# Patient Record
Sex: Female | Born: 1945 | Race: White | Hispanic: No | Marital: Married | Smoking: Former smoker
Health system: Southern US, Community
[De-identification: ages and names within clinical notes are randomized; demographics above are authoritative.]

## PROBLEM LIST (undated history)

## (undated) DIAGNOSIS — F32A Depression, unspecified: Secondary | ICD-10-CM

## (undated) DIAGNOSIS — M810 Age-related osteoporosis without current pathological fracture: Secondary | ICD-10-CM

## (undated) DIAGNOSIS — G5601 Carpal tunnel syndrome, right upper limb: Secondary | ICD-10-CM

## (undated) DIAGNOSIS — R51 Headache: Secondary | ICD-10-CM

## (undated) DIAGNOSIS — Z87442 Personal history of urinary calculi: Secondary | ICD-10-CM

## (undated) DIAGNOSIS — J189 Pneumonia, unspecified organism: Secondary | ICD-10-CM

## (undated) DIAGNOSIS — S52501A Unspecified fracture of the lower end of right radius, initial encounter for closed fracture: Secondary | ICD-10-CM

## (undated) DIAGNOSIS — K589 Irritable bowel syndrome without diarrhea: Secondary | ICD-10-CM

## (undated) DIAGNOSIS — D649 Anemia, unspecified: Secondary | ICD-10-CM

## (undated) DIAGNOSIS — C801 Malignant (primary) neoplasm, unspecified: Secondary | ICD-10-CM

## (undated) DIAGNOSIS — F329 Major depressive disorder, single episode, unspecified: Secondary | ICD-10-CM

## (undated) DIAGNOSIS — I2699 Other pulmonary embolism without acute cor pulmonale: Secondary | ICD-10-CM

## (undated) DIAGNOSIS — M1990 Unspecified osteoarthritis, unspecified site: Secondary | ICD-10-CM

## (undated) DIAGNOSIS — R519 Headache, unspecified: Secondary | ICD-10-CM

## (undated) DIAGNOSIS — K760 Fatty (change of) liver, not elsewhere classified: Secondary | ICD-10-CM

## (undated) DIAGNOSIS — K635 Polyp of colon: Secondary | ICD-10-CM

## (undated) HISTORY — PX: ABDOMINAL HYSTERECTOMY: SHX81

## (undated) HISTORY — PX: TONSILLECTOMY: SUR1361

## (undated) HISTORY — PX: BREAST SURGERY: SHX581

## (undated) HISTORY — PX: COLONOSCOPY: SHX174

## (undated) HISTORY — PX: FRACTURE SURGERY: SHX138

---

## 1998-09-28 ENCOUNTER — Ambulatory Visit (HOSPITAL_COMMUNITY): Admission: RE | Admit: 1998-09-28 | Discharge: 1998-09-28 | Payer: Self-pay | Admitting: Orthopedic Surgery

## 1998-09-29 ENCOUNTER — Ambulatory Visit (HOSPITAL_COMMUNITY): Admission: RE | Admit: 1998-09-29 | Discharge: 1998-09-29 | Payer: Self-pay | Admitting: Orthopedic Surgery

## 1998-09-29 ENCOUNTER — Encounter: Payer: Self-pay | Admitting: Orthopedic Surgery

## 1999-10-17 ENCOUNTER — Other Ambulatory Visit: Admission: RE | Admit: 1999-10-17 | Discharge: 1999-10-17 | Payer: Self-pay | Admitting: *Deleted

## 2000-12-09 ENCOUNTER — Encounter: Payer: Self-pay | Admitting: Family Medicine

## 2000-12-09 ENCOUNTER — Encounter: Admission: RE | Admit: 2000-12-09 | Discharge: 2000-12-09 | Payer: Self-pay | Admitting: Family Medicine

## 2001-08-12 ENCOUNTER — Encounter: Payer: Self-pay | Admitting: Orthopedic Surgery

## 2001-08-12 ENCOUNTER — Ambulatory Visit (HOSPITAL_BASED_OUTPATIENT_CLINIC_OR_DEPARTMENT_OTHER): Admission: RE | Admit: 2001-08-12 | Discharge: 2001-08-12 | Payer: Self-pay | Admitting: Orthopedic Surgery

## 2001-12-16 ENCOUNTER — Ambulatory Visit (HOSPITAL_BASED_OUTPATIENT_CLINIC_OR_DEPARTMENT_OTHER): Admission: RE | Admit: 2001-12-16 | Discharge: 2001-12-16 | Payer: Self-pay | Admitting: Orthopedic Surgery

## 2001-12-16 ENCOUNTER — Encounter: Payer: Self-pay | Admitting: Orthopedic Surgery

## 2003-01-12 ENCOUNTER — Ambulatory Visit (HOSPITAL_BASED_OUTPATIENT_CLINIC_OR_DEPARTMENT_OTHER): Admission: RE | Admit: 2003-01-12 | Discharge: 2003-01-12 | Payer: Self-pay | Admitting: Orthopedic Surgery

## 2013-08-12 DIAGNOSIS — M25559 Pain in unspecified hip: Secondary | ICD-10-CM | POA: Insufficient documentation

## 2015-07-20 ENCOUNTER — Encounter (HOSPITAL_COMMUNITY): Payer: Self-pay | Admitting: *Deleted

## 2015-07-20 NOTE — Progress Notes (Signed)
Pt has been on Phentermine, last dose was yesterday 07/19/15. Dr. Glennon Mac, anesthesiologist made aware. She states to make sure pt doesn't take any more. Pt was instructed not to take any until after surgery.

## 2015-07-21 ENCOUNTER — Encounter (HOSPITAL_BASED_OUTPATIENT_CLINIC_OR_DEPARTMENT_OTHER)
Admission: RE | Disposition: A | Discharge status: Home / Self Care | Payer: Self-pay | Source: Ambulatory Visit | Attending: Orthopedic Surgery

## 2015-07-21 ENCOUNTER — Ambulatory Visit (HOSPITAL_BASED_OUTPATIENT_CLINIC_OR_DEPARTMENT_OTHER): Payer: Medicare Other | Admitting: Anesthesiology

## 2015-07-21 ENCOUNTER — Ambulatory Visit (HOSPITAL_COMMUNITY)
Admission: RE | Admit: 2015-07-21 | Discharge: 2015-07-21 | Disposition: A | Discharge status: Home / Self Care | Payer: Medicare Other | Source: Ambulatory Visit | Attending: Orthopedic Surgery | Admitting: Orthopedic Surgery

## 2015-07-21 ENCOUNTER — Encounter (HOSPITAL_BASED_OUTPATIENT_CLINIC_OR_DEPARTMENT_OTHER): Payer: Self-pay

## 2015-07-21 DIAGNOSIS — Z79899 Other long term (current) drug therapy: Secondary | ICD-10-CM | POA: Diagnosis not present

## 2015-07-21 DIAGNOSIS — G5601 Carpal tunnel syndrome, right upper limb: Secondary | ICD-10-CM

## 2015-07-21 DIAGNOSIS — M1990 Unspecified osteoarthritis, unspecified site: Secondary | ICD-10-CM | POA: Insufficient documentation

## 2015-07-21 DIAGNOSIS — Z79891 Long term (current) use of opiate analgesic: Secondary | ICD-10-CM | POA: Insufficient documentation

## 2015-07-21 DIAGNOSIS — W19XXXA Unspecified fall, initial encounter: Secondary | ICD-10-CM | POA: Insufficient documentation

## 2015-07-21 DIAGNOSIS — Z87891 Personal history of nicotine dependence: Secondary | ICD-10-CM | POA: Diagnosis not present

## 2015-07-21 DIAGNOSIS — K589 Irritable bowel syndrome without diarrhea: Secondary | ICD-10-CM | POA: Insufficient documentation

## 2015-07-21 DIAGNOSIS — F4321 Adjustment disorder with depressed mood: Secondary | ICD-10-CM | POA: Diagnosis not present

## 2015-07-21 DIAGNOSIS — S52501A Unspecified fracture of the lower end of right radius, initial encounter for closed fracture: Secondary | ICD-10-CM | POA: Diagnosis not present

## 2015-07-21 HISTORY — PX: CARPAL TUNNEL RELEASE: SHX101

## 2015-07-21 HISTORY — DX: Age-related osteoporosis without current pathological fracture: M81.0

## 2015-07-21 HISTORY — DX: Pneumonia, unspecified organism: J18.9

## 2015-07-21 HISTORY — DX: Unspecified fracture of the lower end of right radius, initial encounter for closed fracture: S52.501A

## 2015-07-21 HISTORY — DX: Headache: R51

## 2015-07-21 HISTORY — DX: Carpal tunnel syndrome, right upper limb: G56.01

## 2015-07-21 HISTORY — DX: Irritable bowel syndrome, unspecified: K58.9

## 2015-07-21 HISTORY — DX: Depression, unspecified: F32.A

## 2015-07-21 HISTORY — DX: Fatty (change of) liver, not elsewhere classified: K76.0

## 2015-07-21 HISTORY — DX: Polyp of colon: K63.5

## 2015-07-21 HISTORY — DX: Unspecified osteoarthritis, unspecified site: M19.90

## 2015-07-21 HISTORY — DX: Headache, unspecified: R51.9

## 2015-07-21 HISTORY — DX: Malignant (primary) neoplasm, unspecified: C80.1

## 2015-07-21 HISTORY — PX: OPEN REDUCTION INTERNAL FIXATION (ORIF) DISTAL RADIAL FRACTURE: SHX5989

## 2015-07-21 HISTORY — DX: Major depressive disorder, single episode, unspecified: F32.9

## 2015-07-21 HISTORY — DX: Anemia, unspecified: D64.9

## 2015-07-21 LAB — POCT HEMOGLOBIN-HEMACUE: HEMOGLOBIN: 12.4 g/dL (ref 12.0–15.0)

## 2015-07-21 SURGERY — OPEN REDUCTION INTERNAL FIXATION (ORIF) DISTAL RADIUS FRACTURE
Anesthesia: General | Site: Wrist | Laterality: Right

## 2015-07-21 MED ORDER — EPHEDRINE SULFATE 50 MG/ML IJ SOLN
INTRAMUSCULAR | Status: DC | PRN
  Administered 2015-07-21: 10 mg via INTRAVENOUS
  Administered 2015-07-21: 15 mg via INTRAVENOUS
  Administered 2015-07-21: 10 mg via INTRAVENOUS

## 2015-07-21 MED ORDER — LACTATED RINGERS IV SOLN: INTRAVENOUS | Status: DC

## 2015-07-21 MED ORDER — FENTANYL CITRATE (PF) 100 MCG/2ML IJ SOLN
INTRAMUSCULAR | Status: AC
  Filled 2015-07-21: qty 6

## 2015-07-21 MED ORDER — DEXAMETHASONE SODIUM PHOSPHATE 10 MG/ML IJ SOLN
INTRAMUSCULAR | Status: DC | PRN
  Administered 2015-07-21: 10 mg via INTRAVENOUS

## 2015-07-21 MED ORDER — ONDANSETRON HCL 4 MG/2ML IJ SOLN
INTRAMUSCULAR | Status: DC | PRN
  Administered 2015-07-21: 4 mg via INTRAVENOUS

## 2015-07-21 MED ORDER — 0.9 % SODIUM CHLORIDE (POUR BTL) OPTIME
TOPICAL | Status: DC | PRN
  Administered 2015-07-21: 120 mL

## 2015-07-21 MED ORDER — ONDANSETRON HCL 4 MG PO TABS: 4.0000 mg | ORAL_TABLET | Freq: Three times a day (TID) | ORAL | Status: DC | PRN

## 2015-07-21 MED ORDER — BUPIVACAINE-EPINEPHRINE (PF) 0.5% -1:200000 IJ SOLN
INTRAMUSCULAR | Status: DC | PRN
Start: 2015-07-21 — End: 2015-07-21
  Administered 2015-07-21: 20 mL via PERINEURAL

## 2015-07-21 MED ORDER — FENTANYL CITRATE (PF) 100 MCG/2ML IJ SOLN
INTRAMUSCULAR | Status: AC
  Filled 2015-07-21: qty 2

## 2015-07-21 MED ORDER — PROMETHAZINE HCL 25 MG/ML IJ SOLN: 6.2500 mg | INTRAMUSCULAR | Status: DC | PRN

## 2015-07-21 MED ORDER — MIDAZOLAM HCL 2 MG/2ML IJ SOLN
1.0000 mg | INTRAMUSCULAR | Status: DC | PRN
Start: 2015-07-21 — End: 2015-07-21
  Administered 2015-07-21: 2 mg via INTRAVENOUS

## 2015-07-21 MED ORDER — FENTANYL CITRATE (PF) 100 MCG/2ML IJ SOLN
50.0000 ug | INTRAMUSCULAR | Status: DC | PRN
Start: 2015-07-21 — End: 2015-07-21
  Administered 2015-07-21: 50 ug via INTRAVENOUS
  Administered 2015-07-21: 100 ug via INTRAVENOUS

## 2015-07-21 MED ORDER — MIDAZOLAM HCL 2 MG/2ML IJ SOLN
INTRAMUSCULAR | Status: AC
  Filled 2015-07-21: qty 2

## 2015-07-21 MED ORDER — OXYCODONE-ACETAMINOPHEN 10-325 MG PO TABS: 1.0000 | ORAL_TABLET | Freq: Four times a day (QID) | ORAL | Status: DC | PRN

## 2015-07-21 MED ORDER — SENNA-DOCUSATE SODIUM 8.6-50 MG PO TABS: 2.0000 | ORAL_TABLET | Freq: Every day | ORAL | Status: DC

## 2015-07-21 MED ORDER — CEFAZOLIN SODIUM-DEXTROSE 2-3 GM-% IV SOLR
INTRAVENOUS | Status: AC
  Filled 2015-07-21: qty 50

## 2015-07-21 MED ORDER — CEFAZOLIN SODIUM-DEXTROSE 2-3 GM-% IV SOLR
2.0000 g | INTRAVENOUS | Status: AC
  Administered 2015-07-21: 2 g via INTRAVENOUS

## 2015-07-21 MED ORDER — SCOPOLAMINE 1 MG/3DAYS TD PT72: 1.0000 | MEDICATED_PATCH | Freq: Once | TRANSDERMAL | Status: DC | PRN

## 2015-07-21 MED ORDER — GLYCOPYRROLATE 0.2 MG/ML IJ SOLN: 0.2000 mg | Freq: Once | INTRAMUSCULAR | Status: DC | PRN

## 2015-07-21 MED ORDER — HYDROMORPHONE HCL 1 MG/ML IJ SOLN: 0.2500 mg | INTRAMUSCULAR | Status: DC | PRN

## 2015-07-21 MED ORDER — MEPERIDINE HCL 25 MG/ML IJ SOLN: 6.2500 mg | INTRAMUSCULAR | Status: DC | PRN

## 2015-07-21 MED ORDER — PROPOFOL 10 MG/ML IV BOLUS
INTRAVENOUS | Status: DC | PRN
  Administered 2015-07-21: 150 mg via INTRAVENOUS

## 2015-07-21 MED ORDER — LIDOCAINE HCL (CARDIAC) 20 MG/ML IV SOLN
INTRAVENOUS | Status: DC | PRN
  Administered 2015-07-21: 50 mg via INTRAVENOUS

## 2015-07-21 MED ORDER — LACTATED RINGERS IV SOLN
INTRAVENOUS | Status: DC
  Administered 2015-07-21 (×2): via INTRAVENOUS

## 2015-07-21 MED ORDER — LACTATED RINGERS IV SOLN: 500.0000 mL | INTRAVENOUS | Status: DC

## 2015-07-21 SURGICAL SUPPLY — 72 items
BANDAGE ELASTIC 3 VELCRO ST LF (GAUZE/BANDAGES/DRESSINGS) ×2 IMPLANT
BANDAGE ELASTIC 4 VELCRO ST LF (GAUZE/BANDAGES/DRESSINGS) ×1 IMPLANT
BIT DRILL 2 FAST STEP (BIT) ×1 IMPLANT
BIT DRILL 2.5X4 QC (BIT) ×1 IMPLANT
BLADE MINI RND TIP GREEN BEAV (BLADE) IMPLANT
BLADE SURG 15 STRL LF DISP TIS (BLADE) ×1 IMPLANT
BLADE SURG 15 STRL SS (BLADE) ×4
BNDG CMPR 9X4 STRL LF SNTH (GAUZE/BANDAGES/DRESSINGS) ×1
BNDG COHESIVE 3X5 TAN STRL LF (GAUZE/BANDAGES/DRESSINGS) ×2 IMPLANT
BNDG COHESIVE 4X5 TAN STRL (GAUZE/BANDAGES/DRESSINGS) ×1 IMPLANT
BNDG ESMARK 4X9 LF (GAUZE/BANDAGES/DRESSINGS) ×2 IMPLANT
BRUSH SCRUB EZ PLAIN DRY (MISCELLANEOUS) ×1 IMPLANT
CLSR STERI-STRIP ANTIMIC 1/2X4 (GAUZE/BANDAGES/DRESSINGS) ×2 IMPLANT
CORDS BIPOLAR (ELECTRODE) ×2 IMPLANT
COVER BACK TABLE 60X90IN (DRAPES) ×2 IMPLANT
CUFF TOURNIQUET SINGLE 18IN (TOURNIQUET CUFF) ×1 IMPLANT
DECANTER SPIKE VIAL GLASS SM (MISCELLANEOUS) ×1 IMPLANT
DRAPE EXTREMITY T 121X128X90 (DRAPE) ×2 IMPLANT
DRAPE OEC MINIVIEW 54X84 (DRAPES) ×2 IMPLANT
DRAPE ORTHO SPLIT 77X108 STRL (DRAPES) ×2
DRAPE SURG 17X23 STRL (DRAPES) ×2 IMPLANT
DRAPE SURG ORHT 6 SPLT 77X108 (DRAPES) IMPLANT
DRAPE U 20/CS (DRAPES) ×1 IMPLANT
DURAPREP 26ML APPLICATOR (WOUND CARE) ×2 IMPLANT
GAUZE SPONGE 4X4 12PLY STRL (GAUZE/BANDAGES/DRESSINGS) ×2 IMPLANT
GAUZE XEROFORM 1X8 LF (GAUZE/BANDAGES/DRESSINGS) ×1 IMPLANT
GLOVE BIO SURGEON STRL SZ8 (GLOVE) ×2 IMPLANT
GLOVE BIOGEL M STRL SZ7.5 (GLOVE) ×1 IMPLANT
GLOVE BIOGEL PI IND STRL 8 (GLOVE) ×2 IMPLANT
GLOVE BIOGEL PI INDICATOR 8 (GLOVE) ×3
GLOVE EXAM NITRILE MD LF STRL (GLOVE) ×1 IMPLANT
GLOVE ORTHO TXT STRL SZ7.5 (GLOVE) ×2 IMPLANT
GLOVE SURG ORTHO 8.0 STRL STRW (GLOVE) ×2 IMPLANT
GOWN STRL REUS W/ TWL LRG LVL3 (GOWN DISPOSABLE) ×1 IMPLANT
GOWN STRL REUS W/ TWL XL LVL3 (GOWN DISPOSABLE) ×2 IMPLANT
GOWN STRL REUS W/TWL LRG LVL3 (GOWN DISPOSABLE)
GOWN STRL REUS W/TWL XL LVL3 (GOWN DISPOSABLE) ×6
K-WIRE .062X4 (WIRE) ×2 IMPLANT
NDL HYPO 25X1 1.5 SAFETY (NEEDLE) IMPLANT
NEEDLE HYPO 25X1 1.5 SAFETY (NEEDLE) IMPLANT
NS IRRIG 1000ML POUR BTL (IV SOLUTION) ×2 IMPLANT
PACK BASIN DAY SURGERY FS (CUSTOM PROCEDURE TRAY) ×2 IMPLANT
PAD CAST 3X4 CTTN HI CHSV (CAST SUPPLIES) ×1 IMPLANT
PAD CAST 4YDX4 CTTN HI CHSV (CAST SUPPLIES) IMPLANT
PADDING CAST ABS 4INX4YD NS (CAST SUPPLIES) ×1
PADDING CAST ABS COTTON 4X4 ST (CAST SUPPLIES) ×1 IMPLANT
PADDING CAST COTTON 3X4 STRL (CAST SUPPLIES) ×2
PADDING CAST COTTON 4X4 STRL (CAST SUPPLIES) ×2
PEG SUBCHONDRAL SMOOTH 2.0X16 (Peg) ×1 IMPLANT
PEG SUBCHONDRAL SMOOTH 2.0X18 (Peg) ×4 IMPLANT
PEG SUBCHONDRAL SMOOTH 2.0X20 (Peg) ×2 IMPLANT
PLATE SHORT 24.4X51.3 RT (Plate) ×1 IMPLANT
SCREW BN 12X3.5XNS CORT TI (Screw) IMPLANT
SCREW CORT 3.5X10 LNG (Screw) ×1 IMPLANT
SCREW CORT 3.5X12 (Screw) ×2 IMPLANT
SLEEVE SCD COMPRESS KNEE MED (MISCELLANEOUS) ×2 IMPLANT
SLING ARM FOAM STRAP LRG (SOFTGOODS) ×1 IMPLANT
SPLINT PLASTER CAST XFAST 3X15 (CAST SUPPLIES) IMPLANT
SPLINT PLASTER XTRA FASTSET 3X (CAST SUPPLIES) ×10
STOCKINETTE 4X48 STRL (DRAPES) ×2 IMPLANT
SUCTION FRAZIER TIP 10 FR DISP (SUCTIONS) ×2 IMPLANT
SUT ETHILON 3 0 PS 1 (SUTURE) IMPLANT
SUT ETHILON 4 0 PS 2 18 (SUTURE) IMPLANT
SUT MNCRL AB 4-0 PS2 18 (SUTURE) ×1 IMPLANT
SUT VIC AB 0 CT1 27 (SUTURE)
SUT VIC AB 0 CT1 27XBRD ANBCTR (SUTURE) IMPLANT
SUT VICRYL 3-0 CR8 SH (SUTURE) ×2 IMPLANT
SYR BULB 3OZ (MISCELLANEOUS) ×2 IMPLANT
SYR CONTROL 10ML LL (SYRINGE) IMPLANT
TOWEL OR 17X24 6PK STRL BLUE (TOWEL DISPOSABLE) ×2 IMPLANT
TUBE CONNECTING 20X1/4 (TUBING) ×2 IMPLANT
UNDERPAD 30X30 (UNDERPADS AND DIAPERS) ×2 IMPLANT

## 2015-07-21 NOTE — Discharge Instructions (Signed)
Diet: As you were doing prior to hospitalization   Shower:  May shower but keep the wounds dry, use an occlusive plastic wrap, NO SOAKING IN TUB.  If the bandage gets wet, change with a clean dry gauze.  If you have a splint on, leave the splint in place and keep the splint dry with a plastic bag.  Dressing:  You may change your dressing 3-5 days after surgery, unless you have a splint.  If you have a splint, then just leave the splint in place and we will change your bandages during your first follow-up appointment.    If you had hand or foot surgery, we will plan to remove your stitches in about 2 weeks in the office.  For all other surgeries, there are sticky tapes (steri-strips) on your wounds and all the stitches are absorbable.  Leave the steri-strips in place when changing your dressings, they will peel off with time, usually 2-3 weeks.  Activity:  Increase activity slowly as tolerated, but follow the weight bearing instructions below.  The rules on driving is that you can not be taking narcotics while you drive, and you must feel in control of the vehicle.    Weight Bearing:   No lifting with right arm..    To prevent constipation: you may use a stool softener such as -  Colace (over the counter) 100 mg by mouth twice a day  Drink plenty of fluids (prune juice may be helpful) and high fiber foods Miralax (over the counter) for constipation as needed.    Itching:  If you experience itching with your medications, try taking only a single pain pill, or even half a pain pill at a time.  You may take up to 10 pain pills per day, and you can also use benadryl over the counter for itching or also to help with sleep.   Precautions:  If you experience chest pain or shortness of breath - call 911 immediately for transfer to the hospital emergency department!!  If you develop a fever greater that 101 F, purulent drainage from wound, increased redness or drainage from wound, or calf pain -- Call  the office at [phone]                                                Follow- Up Appointment:  Please call for an appointment to be seen in 2 weeks [facility] - [phone]     Regional Anesthesia Blocks  1. Numbness or the inability to move the "blocked" extremity may last from 3-48 hours after placement. The length of time depends on the medication injected and your individual response to the medication. If the numbness is not going away after 48 hours, call your surgeon.  2. The extremity that is blocked will need to be protected until the numbness is gone and the  Strength has returned. Because you cannot feel it, you will need to take extra care to avoid injury. Because it may be weak, you may have difficulty moving it or using it. You may not know what position it is in without looking at it while the block is in effect.  3. For blocks in the legs and feet, returning to weight bearing and walking needs to be done carefully. You will need to wait until the numbness is entirely gone and the strength has  returned. You should be able to move your leg and foot normally before you try and bear weight or walk. You will need someone to be with you when you first try to ensure you do not fall and possibly risk injury.  4. Bruising and tenderness at the needle site are common side effects and will resolve in a few days.  5. Persistent numbness or new problems with movement should be communicated to the surgeon or the [facility] [phone] [place] [phone]).   Post Anesthesia Home Care Instructions  Activity: Get plenty of rest for the remainder of the day. A responsible adult should stay with you for 24 hours following the procedure.  For the next 24 hours, DO NOT: -Drive a car -Paediatric nurse -Drink alcoholic beverages -Take any medication unless instructed by your physician -Make any legal decisions or sign important papers.  Meals: Start  with liquid foods such as gelatin or soup. Progress to regular foods as tolerated. Avoid greasy, spicy, heavy foods. If nausea and/or vomiting occur, drink only clear liquids until the nausea and/or vomiting subsides. Call your physician if vomiting continues.  Special Instructions/Symptoms: Your throat may feel dry or sore from the anesthesia or the breathing tube placed in your throat during surgery. If this causes discomfort, gargle with warm salt water. The discomfort should disappear within 24 hours.  If you had a scopolamine patch placed behind your ear for the management of post- operative nausea and/or vomiting:  1. The medication in the patch is effective for 72 hours, after which it should be removed.  Wrap patch in a tissue and discard in the trash. Wash hands thoroughly with soap and water. 2. You may remove the patch earlier than 72 hours if you experience unpleasant side effects which may include dry mouth, dizziness or visual disturbances. 3. Avoid touching the patch. Wash your hands with soap and water after contact with the patch.

## 2015-07-21 NOTE — Anesthesia Postprocedure Evaluation (Signed)
  Anesthesia Post-op Note  Patient: Hayley Walters  Procedure(s) Performed: Procedure(s): OPEN REDUCTION INTERNAL FIXATION (ORIF) RIGHT DISTAL RADIAL FRACTURE (Right) RIGHT CARPAL TUNNEL RELEASE  (Right)  Patient Location: PACU  Anesthesia Type:General  Level of Consciousness: awake, alert  and oriented  Airway and Oxygen Therapy: Patient Spontanous Breathing  Post-op Pain: none  Post-op Assessment: Post-op Vital signs reviewed and Patient's Cardiovascular Status Stable              Post-op Vital Signs: Reviewed and stable  Last Vitals:  Filed Vitals:   07/21/15 1734  BP: 136/68  Pulse: 85  Temp: 36.4 C  Resp: 16    Complications: No apparent anesthesia complications

## 2015-07-21 NOTE — H&P (Signed)
PREOPERATIVE H&P  Chief Complaint: RIGHT DISTAL RADIUS FRACTURE AND RIGHT CARPAL TUNNEL SYNDROME   HPI: Hayley Walters is a 69 y.o. female who presents for preoperative history and physical with a diagnosis of RIGHT DISTAL RADIUS FRACTURE AND RIGHT CARPAL TUNNEL SYNDROME . Symptoms are rated as moderate to severe, and have been worsening.  This is significantly impairing activities of daily living.  She has elected for surgical management. This was an acute injury after a fall that happened about 48 hours ago. She complained of numbness in the median nerve distribution on low she also has some numbness in the other portions of the hand, but it has not resolved with elevation. She did have a closed reduction performed in the emergency room. She has had previous contralateral distal radius fracture treated nonsurgically.   Past Medical History  Diagnosis Date  . Headache     migraines  . Pneumonia     as a child  . Depression     situational  . Arthritis   . Cancer     Basal cell   . Anemia   . Non-alcoholic fatty liver disease     mild  . IBS (irritable bowel syndrome)   . Colon polyps   . Osteoporosis    Past Surgical History  Procedure Laterality Date  . Tonsillectomy    . Abdominal hysterectomy    . Colonoscopy    . Breast surgery      augmentation  . Fracture surgery Left     hip/femur  . Fracture surgery Right     screws in right hip for stress fracture   Social History   Social History  . Marital Status: Married    Spouse Name: N/A  . Number of Children: N/A  . Years of Education: N/A   Social History Main Topics  . Smoking status: Former Smoker    Quit date: 07/19/2005  . Smokeless tobacco: Never Used  . Alcohol Use: Yes     Comment: occasional/rare  . Drug Use: No  . Sexual Activity: Not Asked   Other Topics Concern  . None   Social History Narrative   Family History  Problem Relation Age of Onset  . Congestive Heart Failure Mother   . Glaucoma  Mother   . Macular degeneration Mother   . CVA Father    No Known Allergies Prior to Admission medications   Medication Sig Start Date End Date Taking? Authorizing Provider  ALPRAZolam Duanne Moron) 0.5 MG tablet Take 0.5 mg by mouth at bedtime.   Yes Historical Provider, MD  calcium carbonate (CALCIUM 600) 600 MG TABS tablet Take 600 mg by mouth daily.   Yes Historical Provider, MD  Cholecalciferol (VITAMIN D3) 2000 UNITS TABS Take 2,000 Units by mouth daily.    Yes Historical Provider, MD  oxyCODONE-acetaminophen (PERCOCET) 10-325 MG per tablet Take 1 tablet by mouth every 6 (six) hours as needed for pain.    Yes Historical Provider, MD  phentermine (ADIPEX-P) 37.5 MG tablet Take 37.5 mg by mouth daily. 06/28/15  Yes Historical Provider, MD  rizatriptan (MAXALT-MLT) 10 MG disintegrating tablet Take 10 mg by mouth See admin instructions. Take 1 tablet (10 mg) by mouth as needed for migraines, may repeat in 2 hours if needed 04/26/15  Yes Historical Provider, MD  sertraline (ZOLOFT) 100 MG tablet Take 150 mg by mouth daily. 05/26/15  Yes Historical Provider, MD  topiramate (TOPAMAX) 50 MG tablet Take 50 mg by mouth 2 (two) times daily.  Yes Historical Provider, MD     Positive ROS: All other systems have been reviewed and were otherwise negative with the exception of those mentioned in the HPI and as above.  Physical Exam: General: Alert, no acute distress Cardiovascular: No pedal edema Respiratory: No cyanosis, no use of accessory musculature GI: No organomegaly, abdomen is soft and non-tender Skin: No lesions in the area of chief complaint Neurologic: Sensation intact distally Psychiatric: Patient is competent for consent with normal mood and affect Lymphatic: No axillary or cervical lymphadenopathy  MUSCULOSKELETAL: Right hand has substantial soft tissue swelling around the fingers, sensation globally decreased but also particularly in the median nerve distribution, all fingers flex extend  and abduct although it's weak secondary to pain.  Assessment: RIGHT DISTAL RADIUS FRACTURE AND RIGHT CARPAL TUNNEL SYNDROME   Plan: Plan for Procedure(s): OPEN REDUCTION INTERNAL FIXATION (ORIF) RIGHT DISTAL RADIAL FRACTURE RIGHT CARPAL TUNNEL RELEASE   The risks benefits and alternatives were discussed with the patient including but not limited to the risks of nonoperative treatment, versus surgical intervention including infection, bleeding, nerve injury, malunion, nonunion, the need for revision surgery, hardware prominence, hardware failure, the need for hardware removal, blood clots, cardiopulmonary complications, morbidity, mortality, among others, and they were willing to proceed.    We also discussed the risks for regional pain syndrome, persistent neurologic deficit, long-term dysfunction of the upper extremity, she understands and wishes to proceed.  Johnny Bridge, MD Cell [phone]   07/21/2015 2:11 PM

## 2015-07-21 NOTE — Anesthesia Procedure Notes (Addendum)
Anesthesia Regional Block:  Supraclavicular block  Pre-Anesthetic Checklist: ,, timeout performed, Correct Patient, Correct Site, Correct Laterality, Correct Procedure, Correct Position, site marked, Risks and benefits discussed,  Surgical consent,  Pre-op evaluation,  At surgeon's request and post-op pain management  Laterality: Right  Prep: alcohol swabs       Needles:  Injection technique: Single-shot  Needle Type: Stimiplex     Needle Length: 5cm 5 cm Needle Gauge: 22 and 22 G    Additional Needles:  Procedures: ultrasound guided (picture in chart) and nerve stimulator Supraclavicular block  Nerve Stimulator or Paresthesia:  Response: biceps flexion,   Additional Responses:   Narrative:  Start time: 07/21/2015 2:15 PM End time: 07/21/2015 2:20 PM Injection made incrementally with aspirations every 5 mL.  Performed by: Personally  Anesthesiologist: Marye Round  Additional Notes: Functioning IV was confirmed and monitors were applied.  A 42mm 22ga Stimuplex needle was used. Sterile prep and drape,hand hygiene and sterile gloves were used.  Negative aspiration and negative test dose prior to incremental administration of local anesthetic. The patient tolerated the procedure well.      Procedure Name: LMA Insertion Date/Time: 07/21/2015 2:59 PM Performed by: Melynda Ripple D Pre-anesthesia Checklist: Patient identified, Emergency Drugs available, Suction available and Patient being monitored Patient Re-evaluated:Patient Re-evaluated prior to inductionOxygen Delivery Method: Circle System Utilized Preoxygenation: Pre-oxygenation with 100% oxygen Intubation Type: IV induction Ventilation: Mask ventilation without difficulty LMA: LMA inserted LMA Size: 4.0 Number of attempts: 1 Airway Equipment and Method: Bite block Placement Confirmation: positive ETCO2 Tube secured with: Tape Dental Injury: Teeth and Oropharynx as per pre-operative assessment

## 2015-07-21 NOTE — Anesthesia Preprocedure Evaluation (Addendum)
Anesthesia Evaluation  Patient identified by MRN, date of birth, ID band Patient awake    Reviewed: Allergy & Precautions, NPO status , Patient's Chart, lab work & pertinent test results  Airway Mallampati: II  TM Distance: >3 FB Neck ROM: Full    Dental  (+) Teeth Intact   Pulmonary former smoker,  breath sounds clear to auscultation        Cardiovascular Exercise Tolerance: Good Rhythm:Regular Rate:Normal     Neuro/Psych  Headaches, PSYCHIATRIC DISORDERS Depression    GI/Hepatic negative GI ROS, Neg liver ROS,   Endo/Other  negative endocrine ROS  Renal/GU negative Renal ROS  negative genitourinary   Musculoskeletal  (+) Arthritis -, Osteoarthritis,    Abdominal   Peds negative pediatric ROS (+)  Hematology   Anesthesia Other Findings   Reproductive/Obstetrics                            Anesthesia Physical Anesthesia Plan  ASA: II  Anesthesia Plan: General   Post-op Pain Management: GA combined w/ Regional for post-op pain   Induction: Intravenous  Airway Management Planned: LMA  Additional Equipment:   Intra-op Plan:   Post-operative Plan: Extubation in OR  Informed Consent: I have reviewed the patients History and Physical, chart, labs and discussed the procedure including the risks, benefits and alternatives for the proposed anesthesia with the patient or authorized representative who has indicated his/her understanding and acceptance.   Dental advisory given  Plan Discussed with: CRNA  Anesthesia Plan Comments:         Anesthesia Quick Evaluation

## 2015-07-21 NOTE — Op Note (Signed)
07/21/2015  4:22 PM  PATIENT:  Hayley Walters    PRE-OPERATIVE DIAGNOSIS:  RIGHT DISTAL RADIUS FRACTURE AND RIGHT CARPAL TUNNEL SYNDROME   POST-OPERATIVE DIAGNOSIS:  Same  PROCEDURE:  ORIF DISTAL RADIUS FRACTURE, 3 PIECES with carpal tunnel release  SURGEON:  Johnny Bridge, MD  PHYSICIAN ASSISTANT: Joya Gaskins, OPA-C, present and scrubbed throughout the case, critical for completion in a timely fashion, and for retraction, instrumentation, and closure.  ANESTHESIA:   General  PREOPERATIVE INDICATIONS:  Hayley Walters is a  69 y.o. female who fell and had a badly displaced distal radius fracture and also complained of significant numbness throughout the hand particularly in the median nerve distribution. She elected for urgent surgical management.  The risks benefits and alternatives were discussed with the patient preoperatively including but not limited to the risks of infection, bleeding, nerve injury, cardiopulmonary complications, the need for revision surgery, tendon rupture, hardware prominence, hardware failure, nonunion, malunion, post-traumatic arthritis, regional pain syndrome, among others, and the patient was willing to proceed.  OPERATIVE IMPLANTS: [employer] DVR volar plate with 2 proximal cortical screws and multiple distal interlocking smooth pegs, using the short standard plate.   OPERATIVE FINDINGS: Comminution of the distal radius fracture with displacement, and hemorrhagic injection of the median nerve.  UNIQUE ASPECTS OF THE CASE:  My initial approach to the median nerve was slightly on the ulnar side, and I actually encountered the ulnar nerve and had dissected it and released it within Guyon's canal. I also released the median nerve completely, and there was hemorrhagic injection within the sheath of the median nerve.  OPERATIVE PROCEDURE: The patient was brought to the operating room and placed in the supine position. General anesthesia was administered. IV  antibiotics were given. Time out was performed. The upper extremity was prepped and draped in usual sterile fashion. The arm was elevated and exsanguinated and the tourniquet was inflated at 269mm hg.    Volar approach to the distal radius and carpal tunnel was carried out. I was slightly ulnar on the carpal tunnel approach, and actually encountered the ulnar nerve, this was protected, I did mobilize the fascia over the anterior aspect of the nerve, and there was a fair amount of hemorrhagic injection within the veins over the nerve as well.  I moved slightly radially, and performed a median nerve decompression at the carpal tunnel, and there was a fair amount of hemorrhagic injection within the sheath of the median nerve.  I used a angled incision across the volar flexor crease, and then dissected down to the volar transverse fascia as well as onto the flexor carpi radialis. This was retracted radially, and the radial artery and median nerve were protected throughout the case.   Deep dissection was carried down, and the pronator quadratus was elevated off of the radius. The fracture site was identified and cleaned and reduced anatomically. This keyed into place nicely.   I held this provisionally with a K wire, and C-arm used to confirm alignment.   I had restored height and inclination and then applied a volar plate. A K wire was used to confirm appropriate position of the plate, and once I was satisfied with the overall alignment I was able to secure the plate proximally with a cortical screw.   I then secured the fracture with multiple smooth interlocking pegs distally, and confirmed that none of these were in the joint, and none of these were penetrating the dorsal cortex. I also secured the plate proximally  with one more cortical screw.   The wounds were irrigated copiously, and the pronator quadratus was not quality enough condition to be repaired, so I released the tourniquet, irrigated the  arm copiously, and achieved excellent hemostasis, and closed the subcutaneous tissue with 3-0 subcutaneous Vicryl for the skin and Steri-Strips and sterile gauze and a volar splint. The tourniquet was released. She was awakened and returned back in stable and satisfactory condition. There were no complications and She tolerated the procedure well.

## 2015-07-21 NOTE — Progress Notes (Signed)
Assisted Dr. Hollis with right, ultrasound guided, supraclavicular block. Side rails up, monitors on throughout procedure. See vital signs in flow sheet. Tolerated Procedure well. 

## 2015-07-21 NOTE — Transfer of Care (Signed)
Immediate Anesthesia Transfer of Care Note  Patient: Hayley Walters  Procedure(s) Performed: Procedure(s): OPEN REDUCTION INTERNAL FIXATION (ORIF) RIGHT DISTAL RADIAL FRACTURE (Right) RIGHT CARPAL TUNNEL RELEASE  (Right)  Patient Location: PACU  Anesthesia Type:General and Regional  Level of Consciousness: awake and alert   Airway & Oxygen Therapy: Patient Spontanous Breathing and Patient connected to face mask oxygen  Post-op Assessment: Report given to RN and Post -op Vital signs reviewed and stable  Post vital signs: Reviewed and stable  Last Vitals:  Filed Vitals:   07/21/15 1445  BP:   Pulse: 82  Temp:   Resp: 19    Complications: No apparent anesthesia complications

## 2015-07-25 ENCOUNTER — Encounter (HOSPITAL_BASED_OUTPATIENT_CLINIC_OR_DEPARTMENT_OTHER): Payer: Self-pay | Admitting: Orthopedic Surgery

## 2015-11-23 DIAGNOSIS — M7631 Iliotibial band syndrome, right leg: Secondary | ICD-10-CM | POA: Insufficient documentation

## 2015-11-23 DIAGNOSIS — M5416 Radiculopathy, lumbar region: Secondary | ICD-10-CM | POA: Insufficient documentation

## 2015-11-23 DIAGNOSIS — M7632 Iliotibial band syndrome, left leg: Secondary | ICD-10-CM | POA: Insufficient documentation

## 2015-12-11 DIAGNOSIS — M5416 Radiculopathy, lumbar region: Secondary | ICD-10-CM | POA: Diagnosis not present

## 2015-12-11 DIAGNOSIS — M47897 Other spondylosis, lumbosacral region: Secondary | ICD-10-CM | POA: Diagnosis not present

## 2015-12-11 DIAGNOSIS — M4806 Spinal stenosis, lumbar region: Secondary | ICD-10-CM | POA: Diagnosis not present

## 2015-12-14 DIAGNOSIS — M4806 Spinal stenosis, lumbar region: Secondary | ICD-10-CM | POA: Diagnosis not present

## 2015-12-14 DIAGNOSIS — M47817 Spondylosis without myelopathy or radiculopathy, lumbosacral region: Secondary | ICD-10-CM | POA: Diagnosis not present

## 2015-12-19 DIAGNOSIS — M545 Low back pain: Secondary | ICD-10-CM | POA: Diagnosis not present

## 2015-12-19 DIAGNOSIS — M5416 Radiculopathy, lumbar region: Secondary | ICD-10-CM | POA: Diagnosis not present

## 2015-12-19 DIAGNOSIS — M4806 Spinal stenosis, lumbar region: Secondary | ICD-10-CM | POA: Diagnosis not present

## 2015-12-27 DIAGNOSIS — M4806 Spinal stenosis, lumbar region: Secondary | ICD-10-CM | POA: Diagnosis not present

## 2015-12-27 DIAGNOSIS — M5416 Radiculopathy, lumbar region: Secondary | ICD-10-CM | POA: Diagnosis not present

## 2015-12-27 DIAGNOSIS — M545 Low back pain: Secondary | ICD-10-CM | POA: Diagnosis not present

## 2015-12-29 DIAGNOSIS — M5416 Radiculopathy, lumbar region: Secondary | ICD-10-CM | POA: Diagnosis not present

## 2015-12-29 DIAGNOSIS — M545 Low back pain: Secondary | ICD-10-CM | POA: Diagnosis not present

## 2015-12-29 DIAGNOSIS — M4806 Spinal stenosis, lumbar region: Secondary | ICD-10-CM | POA: Diagnosis not present

## 2015-12-29 DIAGNOSIS — M4316 Spondylolisthesis, lumbar region: Secondary | ICD-10-CM | POA: Diagnosis not present

## 2016-01-09 DIAGNOSIS — M5416 Radiculopathy, lumbar region: Secondary | ICD-10-CM | POA: Diagnosis not present

## 2016-01-09 DIAGNOSIS — M4806 Spinal stenosis, lumbar region: Secondary | ICD-10-CM | POA: Diagnosis not present

## 2016-01-09 DIAGNOSIS — M545 Low back pain: Secondary | ICD-10-CM | POA: Diagnosis not present

## 2016-01-12 DIAGNOSIS — M5416 Radiculopathy, lumbar region: Secondary | ICD-10-CM | POA: Diagnosis not present

## 2016-01-12 DIAGNOSIS — M4806 Spinal stenosis, lumbar region: Secondary | ICD-10-CM | POA: Diagnosis not present

## 2016-01-23 ENCOUNTER — Other Ambulatory Visit: Payer: Self-pay | Admitting: Neurosurgery

## 2016-01-23 DIAGNOSIS — F329 Major depressive disorder, single episode, unspecified: Secondary | ICD-10-CM | POA: Diagnosis not present

## 2016-01-23 DIAGNOSIS — F419 Anxiety disorder, unspecified: Secondary | ICD-10-CM | POA: Diagnosis not present

## 2016-01-23 DIAGNOSIS — G43909 Migraine, unspecified, not intractable, without status migrainosus: Secondary | ICD-10-CM | POA: Diagnosis not present

## 2016-01-23 DIAGNOSIS — M810 Age-related osteoporosis without current pathological fracture: Secondary | ICD-10-CM | POA: Diagnosis not present

## 2016-01-23 DIAGNOSIS — Z6828 Body mass index (BMI) 28.0-28.9, adult: Secondary | ICD-10-CM | POA: Diagnosis not present

## 2016-01-23 DIAGNOSIS — Z1389 Encounter for screening for other disorder: Secondary | ICD-10-CM | POA: Diagnosis not present

## 2016-01-23 DIAGNOSIS — E785 Hyperlipidemia, unspecified: Secondary | ICD-10-CM | POA: Diagnosis not present

## 2016-01-23 DIAGNOSIS — E559 Vitamin D deficiency, unspecified: Secondary | ICD-10-CM | POA: Diagnosis not present

## 2016-01-25 DIAGNOSIS — M4316 Spondylolisthesis, lumbar region: Secondary | ICD-10-CM | POA: Diagnosis not present

## 2016-02-01 DIAGNOSIS — M4316 Spondylolisthesis, lumbar region: Secondary | ICD-10-CM | POA: Diagnosis not present

## 2016-02-02 DIAGNOSIS — M4316 Spondylolisthesis, lumbar region: Secondary | ICD-10-CM | POA: Diagnosis not present

## 2016-02-05 DIAGNOSIS — M4316 Spondylolisthesis, lumbar region: Secondary | ICD-10-CM | POA: Diagnosis not present

## 2016-02-07 ENCOUNTER — Inpatient Hospital Stay (HOSPITAL_COMMUNITY): Admission: RE | Admit: 2016-02-07 | Payer: Medicare Other | Source: Ambulatory Visit

## 2016-02-07 DIAGNOSIS — M4316 Spondylolisthesis, lumbar region: Secondary | ICD-10-CM | POA: Diagnosis not present

## 2016-02-09 DIAGNOSIS — M4316 Spondylolisthesis, lumbar region: Secondary | ICD-10-CM | POA: Diagnosis not present

## 2016-02-12 DIAGNOSIS — D485 Neoplasm of uncertain behavior of skin: Secondary | ICD-10-CM | POA: Diagnosis not present

## 2016-02-12 DIAGNOSIS — L821 Other seborrheic keratosis: Secondary | ICD-10-CM | POA: Diagnosis not present

## 2016-02-12 DIAGNOSIS — D044 Carcinoma in situ of skin of scalp and neck: Secondary | ICD-10-CM | POA: Diagnosis not present

## 2016-02-12 DIAGNOSIS — I788 Other diseases of capillaries: Secondary | ICD-10-CM | POA: Diagnosis not present

## 2016-02-12 DIAGNOSIS — L820 Inflamed seborrheic keratosis: Secondary | ICD-10-CM | POA: Diagnosis not present

## 2016-02-12 DIAGNOSIS — M4316 Spondylolisthesis, lumbar region: Secondary | ICD-10-CM | POA: Diagnosis not present

## 2016-02-14 ENCOUNTER — Encounter (HOSPITAL_COMMUNITY): Admission: RE | Payer: Self-pay | Source: Ambulatory Visit

## 2016-02-14 ENCOUNTER — Inpatient Hospital Stay (HOSPITAL_COMMUNITY): Admission: RE | Admit: 2016-02-14 | Payer: PPO | Source: Ambulatory Visit | Admitting: Neurosurgery

## 2016-02-14 DIAGNOSIS — M4316 Spondylolisthesis, lumbar region: Secondary | ICD-10-CM | POA: Diagnosis not present

## 2016-02-14 SURGERY — POSTERIOR LUMBAR FUSION 2 LEVEL
Anesthesia: General

## 2016-02-19 DIAGNOSIS — M4316 Spondylolisthesis, lumbar region: Secondary | ICD-10-CM | POA: Diagnosis not present

## 2016-02-23 DIAGNOSIS — M4316 Spondylolisthesis, lumbar region: Secondary | ICD-10-CM | POA: Diagnosis not present

## 2016-02-26 DIAGNOSIS — M4316 Spondylolisthesis, lumbar region: Secondary | ICD-10-CM | POA: Diagnosis not present

## 2016-03-26 DIAGNOSIS — M4316 Spondylolisthesis, lumbar region: Secondary | ICD-10-CM | POA: Diagnosis not present

## 2016-03-26 DIAGNOSIS — Z6828 Body mass index (BMI) 28.0-28.9, adult: Secondary | ICD-10-CM | POA: Diagnosis not present

## 2016-04-16 DIAGNOSIS — H43821 Vitreomacular adhesion, right eye: Secondary | ICD-10-CM | POA: Diagnosis not present

## 2016-04-16 DIAGNOSIS — H43811 Vitreous degeneration, right eye: Secondary | ICD-10-CM | POA: Diagnosis not present

## 2016-05-14 DIAGNOSIS — M9903 Segmental and somatic dysfunction of lumbar region: Secondary | ICD-10-CM | POA: Diagnosis not present

## 2016-05-14 DIAGNOSIS — M5442 Lumbago with sciatica, left side: Secondary | ICD-10-CM | POA: Diagnosis not present

## 2016-05-14 DIAGNOSIS — M9904 Segmental and somatic dysfunction of sacral region: Secondary | ICD-10-CM | POA: Diagnosis not present

## 2016-05-14 DIAGNOSIS — M9905 Segmental and somatic dysfunction of pelvic region: Secondary | ICD-10-CM | POA: Diagnosis not present

## 2016-05-14 DIAGNOSIS — M5127 Other intervertebral disc displacement, lumbosacral region: Secondary | ICD-10-CM | POA: Diagnosis not present

## 2016-05-14 DIAGNOSIS — M5432 Sciatica, left side: Secondary | ICD-10-CM | POA: Diagnosis not present

## 2016-05-14 DIAGNOSIS — M5417 Radiculopathy, lumbosacral region: Secondary | ICD-10-CM | POA: Diagnosis not present

## 2016-05-14 DIAGNOSIS — M4806 Spinal stenosis, lumbar region: Secondary | ICD-10-CM | POA: Diagnosis not present

## 2016-05-20 DIAGNOSIS — M5127 Other intervertebral disc displacement, lumbosacral region: Secondary | ICD-10-CM | POA: Diagnosis not present

## 2016-05-20 DIAGNOSIS — M5417 Radiculopathy, lumbosacral region: Secondary | ICD-10-CM | POA: Diagnosis not present

## 2016-05-20 DIAGNOSIS — M9904 Segmental and somatic dysfunction of sacral region: Secondary | ICD-10-CM | POA: Diagnosis not present

## 2016-05-20 DIAGNOSIS — M9903 Segmental and somatic dysfunction of lumbar region: Secondary | ICD-10-CM | POA: Diagnosis not present

## 2016-05-20 DIAGNOSIS — M4806 Spinal stenosis, lumbar region: Secondary | ICD-10-CM | POA: Diagnosis not present

## 2016-05-20 DIAGNOSIS — M5432 Sciatica, left side: Secondary | ICD-10-CM | POA: Diagnosis not present

## 2016-05-20 DIAGNOSIS — M5442 Lumbago with sciatica, left side: Secondary | ICD-10-CM | POA: Diagnosis not present

## 2016-05-20 DIAGNOSIS — M9905 Segmental and somatic dysfunction of pelvic region: Secondary | ICD-10-CM | POA: Diagnosis not present

## 2016-07-30 DIAGNOSIS — M9903 Segmental and somatic dysfunction of lumbar region: Secondary | ICD-10-CM | POA: Diagnosis not present

## 2016-07-30 DIAGNOSIS — M9905 Segmental and somatic dysfunction of pelvic region: Secondary | ICD-10-CM | POA: Diagnosis not present

## 2016-07-30 DIAGNOSIS — M9904 Segmental and somatic dysfunction of sacral region: Secondary | ICD-10-CM | POA: Diagnosis not present

## 2016-07-30 DIAGNOSIS — M5432 Sciatica, left side: Secondary | ICD-10-CM | POA: Diagnosis not present

## 2016-07-30 DIAGNOSIS — M5117 Intervertebral disc disorders with radiculopathy, lumbosacral region: Secondary | ICD-10-CM | POA: Diagnosis not present

## 2016-07-30 DIAGNOSIS — M5116 Intervertebral disc disorders with radiculopathy, lumbar region: Secondary | ICD-10-CM | POA: Diagnosis not present

## 2016-08-02 DIAGNOSIS — M9903 Segmental and somatic dysfunction of lumbar region: Secondary | ICD-10-CM | POA: Diagnosis not present

## 2016-08-02 DIAGNOSIS — M9905 Segmental and somatic dysfunction of pelvic region: Secondary | ICD-10-CM | POA: Diagnosis not present

## 2016-08-02 DIAGNOSIS — M5432 Sciatica, left side: Secondary | ICD-10-CM | POA: Diagnosis not present

## 2016-08-02 DIAGNOSIS — M5116 Intervertebral disc disorders with radiculopathy, lumbar region: Secondary | ICD-10-CM | POA: Diagnosis not present

## 2016-08-02 DIAGNOSIS — M5117 Intervertebral disc disorders with radiculopathy, lumbosacral region: Secondary | ICD-10-CM | POA: Diagnosis not present

## 2016-08-02 DIAGNOSIS — M9904 Segmental and somatic dysfunction of sacral region: Secondary | ICD-10-CM | POA: Diagnosis not present

## 2016-08-07 DIAGNOSIS — M9903 Segmental and somatic dysfunction of lumbar region: Secondary | ICD-10-CM | POA: Diagnosis not present

## 2016-08-07 DIAGNOSIS — M5117 Intervertebral disc disorders with radiculopathy, lumbosacral region: Secondary | ICD-10-CM | POA: Diagnosis not present

## 2016-08-07 DIAGNOSIS — M9904 Segmental and somatic dysfunction of sacral region: Secondary | ICD-10-CM | POA: Diagnosis not present

## 2016-08-07 DIAGNOSIS — M9905 Segmental and somatic dysfunction of pelvic region: Secondary | ICD-10-CM | POA: Diagnosis not present

## 2016-08-07 DIAGNOSIS — M5116 Intervertebral disc disorders with radiculopathy, lumbar region: Secondary | ICD-10-CM | POA: Diagnosis not present

## 2016-08-07 DIAGNOSIS — M5432 Sciatica, left side: Secondary | ICD-10-CM | POA: Diagnosis not present

## 2016-08-15 DIAGNOSIS — M9903 Segmental and somatic dysfunction of lumbar region: Secondary | ICD-10-CM | POA: Diagnosis not present

## 2016-08-15 DIAGNOSIS — M9904 Segmental and somatic dysfunction of sacral region: Secondary | ICD-10-CM | POA: Diagnosis not present

## 2016-08-15 DIAGNOSIS — M9905 Segmental and somatic dysfunction of pelvic region: Secondary | ICD-10-CM | POA: Diagnosis not present

## 2016-08-15 DIAGNOSIS — M5116 Intervertebral disc disorders with radiculopathy, lumbar region: Secondary | ICD-10-CM | POA: Diagnosis not present

## 2016-08-15 DIAGNOSIS — M5117 Intervertebral disc disorders with radiculopathy, lumbosacral region: Secondary | ICD-10-CM | POA: Diagnosis not present

## 2016-08-15 DIAGNOSIS — M5432 Sciatica, left side: Secondary | ICD-10-CM | POA: Diagnosis not present

## 2016-08-21 DIAGNOSIS — M9903 Segmental and somatic dysfunction of lumbar region: Secondary | ICD-10-CM | POA: Diagnosis not present

## 2016-08-21 DIAGNOSIS — M9905 Segmental and somatic dysfunction of pelvic region: Secondary | ICD-10-CM | POA: Diagnosis not present

## 2016-08-21 DIAGNOSIS — M5117 Intervertebral disc disorders with radiculopathy, lumbosacral region: Secondary | ICD-10-CM | POA: Diagnosis not present

## 2016-08-21 DIAGNOSIS — M9904 Segmental and somatic dysfunction of sacral region: Secondary | ICD-10-CM | POA: Diagnosis not present

## 2016-08-21 DIAGNOSIS — M5116 Intervertebral disc disorders with radiculopathy, lumbar region: Secondary | ICD-10-CM | POA: Diagnosis not present

## 2016-08-21 DIAGNOSIS — M5432 Sciatica, left side: Secondary | ICD-10-CM | POA: Diagnosis not present

## 2016-08-28 DIAGNOSIS — M5432 Sciatica, left side: Secondary | ICD-10-CM | POA: Diagnosis not present

## 2016-08-28 DIAGNOSIS — M5116 Intervertebral disc disorders with radiculopathy, lumbar region: Secondary | ICD-10-CM | POA: Diagnosis not present

## 2016-08-28 DIAGNOSIS — M9904 Segmental and somatic dysfunction of sacral region: Secondary | ICD-10-CM | POA: Diagnosis not present

## 2016-08-28 DIAGNOSIS — M9903 Segmental and somatic dysfunction of lumbar region: Secondary | ICD-10-CM | POA: Diagnosis not present

## 2016-08-28 DIAGNOSIS — M9905 Segmental and somatic dysfunction of pelvic region: Secondary | ICD-10-CM | POA: Diagnosis not present

## 2016-08-28 DIAGNOSIS — M5117 Intervertebral disc disorders with radiculopathy, lumbosacral region: Secondary | ICD-10-CM | POA: Diagnosis not present

## 2016-09-12 DIAGNOSIS — M545 Low back pain: Secondary | ICD-10-CM | POA: Diagnosis not present

## 2016-09-12 DIAGNOSIS — M5388 Other specified dorsopathies, sacral and sacrococcygeal region: Secondary | ICD-10-CM | POA: Diagnosis not present

## 2016-09-12 DIAGNOSIS — M9904 Segmental and somatic dysfunction of sacral region: Secondary | ICD-10-CM | POA: Diagnosis not present

## 2016-09-12 DIAGNOSIS — M5186 Other intervertebral disc disorders, lumbar region: Secondary | ICD-10-CM | POA: Diagnosis not present

## 2016-09-12 DIAGNOSIS — M9905 Segmental and somatic dysfunction of pelvic region: Secondary | ICD-10-CM | POA: Diagnosis not present

## 2016-09-12 DIAGNOSIS — M9903 Segmental and somatic dysfunction of lumbar region: Secondary | ICD-10-CM | POA: Diagnosis not present

## 2016-11-07 ENCOUNTER — Ambulatory Visit (INDEPENDENT_AMBULATORY_CARE_PROVIDER_SITE_OTHER): Payer: PPO | Admitting: Sports Medicine

## 2016-11-07 ENCOUNTER — Encounter: Payer: Self-pay | Admitting: Sports Medicine

## 2016-11-07 DIAGNOSIS — M79674 Pain in right toe(s): Secondary | ICD-10-CM

## 2016-11-07 DIAGNOSIS — L600 Ingrowing nail: Secondary | ICD-10-CM

## 2016-11-07 NOTE — Patient Instructions (Signed)

## 2016-11-07 NOTE — Progress Notes (Signed)
Subjective: Hayley Walters is a 70 y.o. female patient presents to office today complaining of a painful incurvated, medial and lateral nail margins of the right 1st toe. This has been present for off and on for years. Patient has treated this by trimming. Patient denies fever/chills/nausea/vomitting/any other related constitutional symptoms at this time.  Patient Active Problem List   Diagnosis Date Noted  . Iliotibial band syndrome of left side 11/23/2015  . Iliotibial band syndrome of right side 11/23/2015  . Lumbar radiculopathy 11/23/2015  . Acute carpal tunnel syndrome of right wrist 07/21/2015  . Fracture of radius, distal, right, closed 07/21/2015  . Hip joint pain 08/12/2013    Current Outpatient Prescriptions on File Prior to Visit  Medication Sig Dispense Refill  . ALPRAZolam (XANAX) 0.5 MG tablet Take 0.5 mg by mouth at bedtime.    . calcium carbonate (CALCIUM 600) 600 MG TABS tablet Take 600 mg by mouth daily.    . Cholecalciferol (VITAMIN D3) 2000 UNITS TABS Take 2,000 Units by mouth daily.     . ondansetron (ZOFRAN) 4 MG tablet Take 1 tablet (4 mg total) by mouth every 8 (eight) hours as needed for nausea or vomiting. (Patient not taking: Reported on 02/02/2016) 30 tablet 0  . oxyCODONE-acetaminophen (PERCOCET) 10-325 MG per tablet Take 1-2 tablets by mouth every 6 (six) hours as needed for pain. (Patient not taking: Reported on 02/02/2016) 50 tablet 0  . phentermine (ADIPEX-P) 37.5 MG tablet Take 37.5 mg by mouth daily.  0  . rizatriptan (MAXALT-MLT) 10 MG disintegrating tablet Take 10 mg by mouth See admin instructions. Take 1 tablet (10 mg) by mouth as needed for migraines, may repeat in 2 hours if needed  3  . sennosides-docusate sodium (SENOKOT-S) 8.6-50 MG tablet Take 2 tablets by mouth daily. (Patient not taking: Reported on 02/02/2016) 30 tablet 1  . sertraline (ZOLOFT) 100 MG tablet Take 150 mg by mouth daily.  3  . topiramate (TOPAMAX) 50 MG tablet Take 50 mg by mouth 2  (two) times daily.    . traMADol (ULTRAM) 50 MG tablet Take 50 mg by mouth every 8 (eight) hours as needed for moderate pain.     No current facility-administered medications on file prior to visit.     No Known Allergies  Objective:  There were no vitals filed for this visit.  General: Well developed, nourished, in no acute distress, alert and oriented x3   Dermatology: Skin is warm, dry and supple bilateral.Right hallux nail appears to be  mildly incurvated with hyperkeratosis formation at the distal aspects of  the medial and lateral nail border. (-) Erythema. (-) Edema. (-) serosanguous  drainage present. The remaining nails appear unremarkable at this time no other acute symptoms. There are no open sores, lesions or other signs of infection  present.  Vascular: Dorsalis Pedis artery and Posterior Tibial artery pedal pulses are 2/4 bilateral with immedate capillary fill time. Pedal hair growth present. No lower extremity edema.   Neruologic: Grossly intact via light touch bilateral.  Musculoskeletal: Tenderness to palpation of the Right hallux nail fold(s). Bunion/limitus R>L. Muscular strength within normal limits in all groups bilateral.   Assesement and Plan: Problem List Items Addressed This Visit    None    Visit Diagnoses    Ingrown nail    -  Primary   Toe pain, right          -Discussed treatment alternatives and plan of care; Explained permanent/temporary nail avulsion and post  procedure course to patient. - After a verbal consent, injected 3 ml of a 50:50 mixture of 2% plain  lidocaine and 0.5% plain marcaine in a normal hallux block fashion. Next, a  betadine prep was performed. Anesthesia was tested and found to be appropriate.  The offending right hallux medial and lateral nail borders were then incised from the hyponychium to the epinychium. The offending nail border was removed and cleared from the field. The area was curretted for any remaining nail or  spicules. Phenol application performed and the area was then flushed with alcohol and dressed with antibiotic cream and a dry sterile dressing. -Patient was instructed to leave the dressing intact for today and begin soaking  in a weak solution of betadine and water tomorrow. Patient was instructed to  soak for 15 minutes each day and apply neosporin and a gauze or bandaid dressing each day. -Patient was instructed to monitor the toe for signs of infection and return to office if toe becomes red, hot or swollen. -Advised ice, elevation, and tylenol or motrin if needed for pain.  -Patient is to return in 2 weeks for follow up care/nail check or sooner if problems arise.  Landis Martins, DPM

## 2016-11-12 DIAGNOSIS — J208 Acute bronchitis due to other specified organisms: Secondary | ICD-10-CM | POA: Diagnosis not present

## 2016-11-12 DIAGNOSIS — E663 Overweight: Secondary | ICD-10-CM | POA: Diagnosis not present

## 2016-11-12 DIAGNOSIS — Z6829 Body mass index (BMI) 29.0-29.9, adult: Secondary | ICD-10-CM | POA: Diagnosis not present

## 2016-11-12 DIAGNOSIS — J029 Acute pharyngitis, unspecified: Secondary | ICD-10-CM | POA: Diagnosis not present

## 2016-11-21 ENCOUNTER — Encounter: Payer: Self-pay | Admitting: Sports Medicine

## 2016-11-21 ENCOUNTER — Ambulatory Visit (INDEPENDENT_AMBULATORY_CARE_PROVIDER_SITE_OTHER): Payer: Self-pay | Admitting: Sports Medicine

## 2016-11-21 DIAGNOSIS — Z9889 Other specified postprocedural states: Secondary | ICD-10-CM

## 2016-11-21 DIAGNOSIS — M79674 Pain in right toe(s): Secondary | ICD-10-CM

## 2016-11-21 NOTE — Patient Instructions (Signed)

## 2016-11-21 NOTE — Progress Notes (Signed)
Subjective: Hayley Walters is a 71 y.o. female patient returns to office today for follow up evaluation after having Right Hallux medial and lateral permanent nail avulsion performed on 11-07-16. Patient has been soaking using betadine and applying topical antibiotic covered with bandaid daily. Patient deniesfever/chills/nausea/vomitting/any other related constitutional symptoms at this time.  Patient Active Problem List   Diagnosis Date Noted  . Iliotibial band syndrome of left side 11/23/2015  . Iliotibial band syndrome of right side 11/23/2015  . Lumbar radiculopathy 11/23/2015  . Acute carpal tunnel syndrome of right wrist 07/21/2015  . Fracture of radius, distal, right, closed 07/21/2015  . Hip joint pain 08/12/2013    Current Outpatient Prescriptions on File Prior to Visit  Medication Sig Dispense Refill  . ALPRAZolam (XANAX) 0.5 MG tablet Take 0.5 mg by mouth at bedtime.    . calcium carbonate (CALCIUM 600) 600 MG TABS tablet Take 600 mg by mouth daily.    . Cholecalciferol (VITAMIN D3) 2000 UNITS TABS Take 2,000 Units by mouth daily.     . ondansetron (ZOFRAN) 4 MG tablet Take 1 tablet (4 mg total) by mouth every 8 (eight) hours as needed for nausea or vomiting. (Patient not taking: Reported on 02/02/2016) 30 tablet 0  . oxyCODONE-acetaminophen (PERCOCET) 10-325 MG per tablet Take 1-2 tablets by mouth every 6 (six) hours as needed for pain. (Patient not taking: Reported on 02/02/2016) 50 tablet 0  . phentermine (ADIPEX-P) 37.5 MG tablet Take 37.5 mg by mouth daily.  0  . rizatriptan (MAXALT-MLT) 10 MG disintegrating tablet Take 10 mg by mouth See admin instructions. Take 1 tablet (10 mg) by mouth as needed for migraines, may repeat in 2 hours if needed  3  . sennosides-docusate sodium (SENOKOT-S) 8.6-50 MG tablet Take 2 tablets by mouth daily. (Patient not taking: Reported on 02/02/2016) 30 tablet 1  . sertraline (ZOLOFT) 100 MG tablet Take 150 mg by mouth daily.  3  . topiramate (TOPAMAX) 50  MG tablet Take 50 mg by mouth 2 (two) times daily.    . traMADol (ULTRAM) 50 MG tablet Take 50 mg by mouth every 8 (eight) hours as needed for moderate pain.     No current facility-administered medications on file prior to visit.     No Known Allergies  Objective:  General: Well developed, nourished, in no acute distress, alert and oriented x3   Dermatology: Skin is warm, dry and supple bilateral. Right hallux medial and lateral nail bed appears to be clean, dry, with mild granular tissue and surrounding eschar/scab. (-) Erythema. (-) Edema. (-) serosanguous drainage present. The remaining nails appear unremarkable at this time. There are no other lesions or other signs of infection present.  Neurovascular status: Intact. No lower extremity swelling; No pain with calf compression bilateral.  Musculoskeletal: Decreased tenderness to palpation of the right hallux nail fold(s). Muscular strength within normal limits bilateral.   Assesement and Plan: Problem List Items Addressed This Visit    None    Visit Diagnoses    S/P nail surgery    -  Primary   Toe pain, right          -Examined patient  -Cleansed right hallux medial and lateral nail fold and gently scrubbed with peroxide and q-tip/curetted away eschar at site and applied antibiotic cream covered with bandaid.  -Discussed plan of care with patient. -Patient to now begin soaking in a weak solution of Epsom salt and warm water. Patient was instructed to soak for 15-20 minutes  each day until the toe appears normal and there is no drainage, redness, tenderness, or swelling at the procedure site, and apply neosporin and a gauze or bandaid dressing each day as needed. May leave open to air at night. -Educated patient on long term care after nail surgery. -Patient was instructed to monitor the toe for reoccurrence and signs of infection; Patient advised to return to office or go to ER if toe becomes red, hot or swollen. -Patient is to  return as needed or sooner if problems arise.  Landis Martins, DPM

## 2016-11-29 ENCOUNTER — Telehealth: Payer: Self-pay | Admitting: Sports Medicine

## 2016-11-29 NOTE — Telephone Encounter (Signed)
Toe was tender to touch last time I was in but now it has started to ooze a little. I was just wondering what I should be doing, Im scheduled to come in on Jan 5th, in [place].

## 2016-11-29 NOTE — Telephone Encounter (Signed)
I spoke with pt and she states she gets a soak in every now and then. I told pt she needed to continue the epsom salt soak 2x daily for 20 minutes or switch to 1T Betadine in a 1qt of water with the same soaking instructions, and cover with neosporin bandaid until her appt on 12/06/2016, and if worsens to call earlier. I told pt that a deep wound needed to heal from the inside out and that if the soaks were stopped to earlier then the wound would heal from the outside in and could trap bacteria or drainage inside the toe. Pt states understanding.

## 2016-12-06 ENCOUNTER — Encounter: Payer: Self-pay | Admitting: Sports Medicine

## 2016-12-06 ENCOUNTER — Ambulatory Visit (INDEPENDENT_AMBULATORY_CARE_PROVIDER_SITE_OTHER): Payer: PPO | Admitting: Sports Medicine

## 2016-12-06 DIAGNOSIS — L600 Ingrowing nail: Secondary | ICD-10-CM

## 2016-12-06 DIAGNOSIS — M79675 Pain in left toe(s): Secondary | ICD-10-CM

## 2016-12-06 DIAGNOSIS — M79674 Pain in right toe(s): Secondary | ICD-10-CM

## 2016-12-06 NOTE — Patient Instructions (Signed)

## 2016-12-06 NOTE — Progress Notes (Signed)
Subjective: Hayley Walters is a 71 y.o. female patient presents to office today complaining of a painful incurvated, medial and lateral nail margins of the left 1st toe is still a little pain at the right lateral aspect of the first toe at area of previous nail surgery. Patient desires nail procedure for left and to rediscussed options for right. Patient denies fever/chills/nausea/vomitting/any other related constitutional symptoms at this time.  Patient Active Problem List   Diagnosis Date Noted  . Iliotibial band syndrome of left side 11/23/2015  . Iliotibial band syndrome of right side 11/23/2015  . Lumbar radiculopathy 11/23/2015  . Acute carpal tunnel syndrome of right wrist 07/21/2015  . Fracture of radius, distal, right, closed 07/21/2015  . Hip joint pain 08/12/2013    Current Outpatient Prescriptions on File Prior to Visit  Medication Sig Dispense Refill  . ALPRAZolam (XANAX) 0.5 MG tablet Take 0.5 mg by mouth at bedtime.    . calcium carbonate (CALCIUM 600) 600 MG TABS tablet Take 600 mg by mouth daily.    . Cholecalciferol (VITAMIN D3) 2000 UNITS TABS Take 2,000 Units by mouth daily.     . ondansetron (ZOFRAN) 4 MG tablet Take 1 tablet (4 mg total) by mouth every 8 (eight) hours as needed for nausea or vomiting. (Patient not taking: Reported on 02/02/2016) 30 tablet 0  . oxyCODONE-acetaminophen (PERCOCET) 10-325 MG per tablet Take 1-2 tablets by mouth every 6 (six) hours as needed for pain. (Patient not taking: Reported on 02/02/2016) 50 tablet 0  . phentermine (ADIPEX-P) 37.5 MG tablet Take 37.5 mg by mouth daily.  0  . rizatriptan (MAXALT-MLT) 10 MG disintegrating tablet Take 10 mg by mouth See admin instructions. Take 1 tablet (10 mg) by mouth as needed for migraines, may repeat in 2 hours if needed  3  . sennosides-docusate sodium (SENOKOT-S) 8.6-50 MG tablet Take 2 tablets by mouth daily. (Patient not taking: Reported on 02/02/2016) 30 tablet 1  . sertraline (ZOLOFT) 100 MG tablet  Take 150 mg by mouth daily.  3  . topiramate (TOPAMAX) 50 MG tablet Take 50 mg by mouth 2 (two) times daily.    . traMADol (ULTRAM) 50 MG tablet Take 50 mg by mouth every 8 (eight) hours as needed for moderate pain.     No current facility-administered medications on file prior to visit.     No Known Allergies  Objective:  There were no vitals filed for this visit.  General: Well developed, nourished, in no acute distress, alert and oriented x3   Dermatology: Skin is warm, dry and supple bilateral.Left hallux nail appears to be mildly incurvated with hyperkeratosis formation at the distal aspects of the medial and lateral nail border. (-) Erythema. (-) Edema. (-) serosanguous drainage present. There is still residual swelling and tenderness along the lateral nail fold of the right hallux with possible residual nail at the base of the nail fold, but could be acting as a irritant because the skin fold cannot lay flat. The remaining nails appear unremarkable at this time no other acute symptoms. There are no open sores, lesions or other signs of infection  present.  Vascular: Dorsalis Pedis artery and Posterior Tibial artery pedal pulses are 2/4 bilateral with immedate capillary fill time. Pedal hair growth present. No lower extremity edema.   Neruologic: Grossly intact via light touch bilateral.  Musculoskeletal: Tenderness to palpation of the left and Right hallux nail fold(s). Bunion/limitus R>L. Muscular strength within normal limits in all groups bilateral.  Assesement and Plan: Problem List Items Addressed This Visit    None    Visit Diagnoses    Ingrown nail    -  Primary   Toe pain, bilateral          -Discussed treatment alternatives and plan of care; Explained permanent/temporary nail avulsion and post procedure course to patient. - After a verbal consent, injected 3 ml of a 50:50 mixture of 2% plain  lidocaine and 0.5% plain marcaine in a normal hallux block fashion. Next,  a betadine prep was performed. Anesthesia was tested and found to be appropriate.  The offendingLeft hallux lateral and medial and right hallux lateral nail borders were then incised from the hyponychium to the epinychium. The offending nail border was removed and cleared from the field. The area was curretted for any remaining nail or spicules. Phenol application performed and the area was then flushed with alcohol and dressed with antibiotic cream and a dry sterile dressing. -Patient was instructed to leave the dressing intact for today and begin soaking in a weak solution of betadine or Epsom salt and water tomorrow. Patient was instructed to soak for 15 minutes each day and apply neosporin and a gauze or bandaid dressing each day. -Patient was instructed to monitor the toes for signs of infection and return to office if toe becomes red, hot or swollen. -Advised ice, elevation, and tylenol or motrin if needed for pain.  -Patient is to return in 2 weeks for follow up care/nail check or sooner if problems arise.  Landis Martins, DPM

## 2016-12-20 ENCOUNTER — Encounter: Payer: Self-pay | Admitting: Sports Medicine

## 2016-12-20 ENCOUNTER — Ambulatory Visit (INDEPENDENT_AMBULATORY_CARE_PROVIDER_SITE_OTHER): Payer: Self-pay | Admitting: Sports Medicine

## 2016-12-20 DIAGNOSIS — Z9889 Other specified postprocedural states: Secondary | ICD-10-CM

## 2016-12-20 DIAGNOSIS — M79674 Pain in right toe(s): Secondary | ICD-10-CM

## 2016-12-20 DIAGNOSIS — M79675 Pain in left toe(s): Secondary | ICD-10-CM

## 2016-12-20 NOTE — Patient Instructions (Signed)

## 2016-12-20 NOTE — Progress Notes (Signed)
Subjective: Hayley Walters is a 71 y.o. female patient returns to office today for follow up evaluation after having Right lateral and left medial and lateral permanent nail avulsion performed on 12-06-16. Patient has been soaking using betadine and epsom salt and applying topical antibiotic covered with bandaid daily. Patient states that the left lateral corner is still a little tender and is oozing; deniesfever/chills/nausea/vomitting/any other related constitutional symptoms at this time.  Patient Active Problem List   Diagnosis Date Noted  . Iliotibial band syndrome of left side 11/23/2015  . Iliotibial band syndrome of right side 11/23/2015  . Lumbar radiculopathy 11/23/2015  . Acute carpal tunnel syndrome of right wrist 07/21/2015  . Fracture of radius, distal, right, closed 07/21/2015  . Hip joint pain 08/12/2013    Current Outpatient Prescriptions on File Prior to Visit  Medication Sig Dispense Refill  . ALPRAZolam (XANAX) 0.5 MG tablet Take 0.5 mg by mouth at bedtime.    . calcium carbonate (CALCIUM 600) 600 MG TABS tablet Take 600 mg by mouth daily.    . Cholecalciferol (VITAMIN D3) 2000 UNITS TABS Take 2,000 Units by mouth daily.     . ondansetron (ZOFRAN) 4 MG tablet Take 1 tablet (4 mg total) by mouth every 8 (eight) hours as needed for nausea or vomiting. (Patient not taking: Reported on 02/02/2016) 30 tablet 0  . oxyCODONE-acetaminophen (PERCOCET) 10-325 MG per tablet Take 1-2 tablets by mouth every 6 (six) hours as needed for pain. (Patient not taking: Reported on 02/02/2016) 50 tablet 0  . phentermine (ADIPEX-P) 37.5 MG tablet Take 37.5 mg by mouth daily.  0  . rizatriptan (MAXALT-MLT) 10 MG disintegrating tablet Take 10 mg by mouth See admin instructions. Take 1 tablet (10 mg) by mouth as needed for migraines, may repeat in 2 hours if needed  3  . sennosides-docusate sodium (SENOKOT-S) 8.6-50 MG tablet Take 2 tablets by mouth daily. (Patient not taking: Reported on 02/02/2016) 30  tablet 1  . sertraline (ZOLOFT) 100 MG tablet Take 150 mg by mouth daily.  3  . topiramate (TOPAMAX) 50 MG tablet Take 50 mg by mouth 2 (two) times daily.    . traMADol (ULTRAM) 50 MG tablet Take 50 mg by mouth every 8 (eight) hours as needed for moderate pain.     No current facility-administered medications on file prior to visit.     No Known Allergies  Objective:  General: Well developed, nourished, in no acute distress, alert and oriented x3   Dermatology: Skin is warm, dry and supple bilateral. Right lateral and left hallux medial/lateral nail beds appears to be clean, dry, with mild granular tissue and surrounding eschar/scab. Minimal Erythema on left. (-) Edema. (-) serosanguous drainage present. The remaining nails appear unremarkable at this time. There are no other lesions or other signs of infection  present.  Neurovascular status: Intact. No lower extremity swelling; No pain with calf compression bilateral.  Musculoskeletal: Decreased tenderness to palpation of the Right>Left  hallux nail fold(s). Muscular strength within normal limits bilateral.   Assesement and Plan: Problem List Items Addressed This Visit    None    Visit Diagnoses    S/P nail surgery    -  Primary   Toe pain, bilateral          -Examined patient  -Cleansed right and left hallux medial/lateral nail fold and gently scrubbed with peroxide and q-tip/curetted away eschar at site and applied antibiotic cream covered with bandaid.  -Discussed plan of care with  patient. -Patient to now begin soaking in a weak solution of Epsom salt and warm water. Patient was instructed to soak for 15-20 minutes each day until the toe appears normal and there is no drainage, redness, tenderness, or swelling at the procedure site, and apply neosporin and a gauze or bandaid dressing each day as needed. May leave open to air at night. -Educated patient on long term care after nail surgery. -Patient was instructed to monitor  the toe for reoccurrence and signs of infection; Patient advised to return to office or go to ER if toe becomes red, hot or swollen. -Patient is to return as needed or sooner if problems arise.  Landis Martins, DPM

## 2017-01-02 ENCOUNTER — Telehealth: Payer: Self-pay | Admitting: *Deleted

## 2017-01-02 NOTE — Telephone Encounter (Addendum)
-----   Message from Rodney sent at 01/02/2017  4:48 PM EST ----- Regarding: Ingrown nail removal complications Contact: [id number] Patient has followed instructions, soaking every day until now, still has a lump on side and tender to touch.  What should she do? I spoke with pt and asked her to soak 2 x daily and to use the neosporin after each soak, I told her I would have a scheduler contact her to schedule an appt for tomorrow. Pt states she would be ok with waiting until next week. I told her then I would see if one of the other doctors would prescribe an antibiotic if he felt it was needed. Pt states understanding. 01/03/2017-I informed pt of Dr. Leigh Aurora orders for Keflex and transferred pt to schedulers to get an appt for next week.

## 2017-01-03 MED ORDER — CEPHALEXIN 500 MG PO CAPS: 500.0000 mg | ORAL_CAPSULE | Freq: Three times a day (TID) | ORAL | 0 refills | Status: DC

## 2017-01-03 NOTE — Telephone Encounter (Signed)
Can start keflex 500mg  TID x 10 days

## 2017-01-09 ENCOUNTER — Encounter: Payer: Self-pay | Admitting: Sports Medicine

## 2017-01-09 ENCOUNTER — Ambulatory Visit (INDEPENDENT_AMBULATORY_CARE_PROVIDER_SITE_OTHER): Payer: Self-pay | Admitting: Sports Medicine

## 2017-01-09 DIAGNOSIS — M79675 Pain in left toe(s): Secondary | ICD-10-CM

## 2017-01-09 DIAGNOSIS — Z9889 Other specified postprocedural states: Secondary | ICD-10-CM

## 2017-01-09 DIAGNOSIS — M79674 Pain in right toe(s): Secondary | ICD-10-CM

## 2017-01-09 NOTE — Progress Notes (Signed)
Subjective: Hayley Walters is a 71 y.o. female patient returns to office today for follow up evaluation after having Right lateral and left medial and lateral permanent nail avulsion performed on 12-06-16. Patient has been soaking using betadine and epsom salt and applying topical antibiotic covered with bandaid daily and last week noticed a swollen red area at the left base of toe; called office and was started on antibiotics. Patient states that the area is better now; deniesfever/chills/nausea/vomitting/any other related constitutional symptoms at this time.  Patient Active Problem List   Diagnosis Date Noted  . Iliotibial band syndrome of left side 11/23/2015  . Iliotibial band syndrome of right side 11/23/2015  . Lumbar radiculopathy 11/23/2015  . Acute carpal tunnel syndrome of right wrist 07/21/2015  . Fracture of radius, distal, right, closed 07/21/2015  . Hip joint pain 08/12/2013    Current Outpatient Prescriptions on File Prior to Visit  Medication Sig Dispense Refill  . ALPRAZolam (XANAX) 0.5 MG tablet Take 0.5 mg by mouth at bedtime.    . calcium carbonate (CALCIUM 600) 600 MG TABS tablet Take 600 mg by mouth daily.    . cephALEXin (KEFLEX) 500 MG capsule Take 1 capsule (500 mg total) by mouth 3 (three) times daily. 30 capsule 0  . Cholecalciferol (VITAMIN D3) 2000 UNITS TABS Take 2,000 Units by mouth daily.     . ondansetron (ZOFRAN) 4 MG tablet Take 1 tablet (4 mg total) by mouth every 8 (eight) hours as needed for nausea or vomiting. (Patient not taking: Reported on 02/02/2016) 30 tablet 0  . oxyCODONE-acetaminophen (PERCOCET) 10-325 MG per tablet Take 1-2 tablets by mouth every 6 (six) hours as needed for pain. (Patient not taking: Reported on 02/02/2016) 50 tablet 0  . phentermine (ADIPEX-P) 37.5 MG tablet Take 37.5 mg by mouth daily.  0  . rizatriptan (MAXALT-MLT) 10 MG disintegrating tablet Take 10 mg by mouth See admin instructions. Take 1 tablet (10 mg) by mouth as needed for  migraines, may repeat in 2 hours if needed  3  . sennosides-docusate sodium (SENOKOT-S) 8.6-50 MG tablet Take 2 tablets by mouth daily. (Patient not taking: Reported on 02/02/2016) 30 tablet 1  . sertraline (ZOLOFT) 100 MG tablet Take 150 mg by mouth daily.  3  . topiramate (TOPAMAX) 50 MG tablet Take 50 mg by mouth 2 (two) times daily.    . traMADol (ULTRAM) 50 MG tablet Take 50 mg by mouth every 8 (eight) hours as needed for moderate pain.     No current facility-administered medications on file prior to visit.     No Known Allergies  Objective:  General: Well developed, nourished, in no acute distress, alert and oriented x3   Dermatology: Skin is warm, dry and supple bilateral. Right lateral and left hallux medial/lateral nail beds appears to be clean, dry, with mild granular tissue and surrounding eschar/scab. Minimal Erythema on left. (-) Edema. (-) serosanguous drainage present. The remaining nails appear unremarkable at this time. There are no other lesions or other signs of infection  present.  Neurovascular status: Intact. No lower extremity swelling; No pain with calf compression bilateral.  Musculoskeletal: Decreased tenderness to palpation of the Right>Left  hallux nail fold(s). Muscular strength within normal limits bilateral.   Assesement and Plan: Problem List Items Addressed This Visit    None    Visit Diagnoses    S/P nail surgery    -  Primary   Toe pain, bilateral          -  Examined patient  -Cleansed right and left hallux medial/lateral nail fold and gently scrubbed with peroxide and q-tip/curetted away eschar at site and applied antibiotic cream covered with bandaid.  -Discussed plan of care with patient. -Patient to continue with antibiotics until completed  -Patient to continue soaking in a weak solution of Epsom salt and warm water. Patient was instructed to soak for 15-20 minutes each day until the toe appears normal and there is no drainage, redness,  tenderness, or swelling at the procedure site, and apply neosporin and a gauze or bandaid dressing each day as needed. May leave open to air at night. -Educated patient on long term care after nail surgery. -Patient was instructed to monitor the toe for reoccurrence and signs of infection; Patient advised to return to office or go to ER if toe becomes red, hot or swollen. -Patient is to return as needed or sooner if problems arise.  Landis Martins, DPM

## 2017-01-14 DIAGNOSIS — Z1231 Encounter for screening mammogram for malignant neoplasm of breast: Secondary | ICD-10-CM | POA: Diagnosis not present

## 2017-01-14 DIAGNOSIS — E669 Obesity, unspecified: Secondary | ICD-10-CM | POA: Diagnosis not present

## 2017-01-14 DIAGNOSIS — Z0000 Encounter for general adult medical examination without abnormal findings: Secondary | ICD-10-CM | POA: Diagnosis not present

## 2017-01-14 DIAGNOSIS — Z1211 Encounter for screening for malignant neoplasm of colon: Secondary | ICD-10-CM | POA: Diagnosis not present

## 2017-01-14 DIAGNOSIS — Z136 Encounter for screening for cardiovascular disorders: Secondary | ICD-10-CM | POA: Diagnosis not present

## 2017-01-14 DIAGNOSIS — Z1389 Encounter for screening for other disorder: Secondary | ICD-10-CM | POA: Diagnosis not present

## 2017-01-14 DIAGNOSIS — Z9181 History of falling: Secondary | ICD-10-CM | POA: Diagnosis not present

## 2017-03-11 DIAGNOSIS — J208 Acute bronchitis due to other specified organisms: Secondary | ICD-10-CM | POA: Diagnosis not present

## 2017-03-11 DIAGNOSIS — Z6830 Body mass index (BMI) 30.0-30.9, adult: Secondary | ICD-10-CM | POA: Diagnosis not present

## 2017-08-25 DIAGNOSIS — H35373 Puckering of macula, bilateral: Secondary | ICD-10-CM | POA: Diagnosis not present

## 2017-08-25 DIAGNOSIS — J029 Acute pharyngitis, unspecified: Secondary | ICD-10-CM | POA: Diagnosis not present

## 2017-08-25 DIAGNOSIS — Z6833 Body mass index (BMI) 33.0-33.9, adult: Secondary | ICD-10-CM | POA: Diagnosis not present

## 2017-08-25 DIAGNOSIS — J069 Acute upper respiratory infection, unspecified: Secondary | ICD-10-CM | POA: Diagnosis not present

## 2017-08-25 DIAGNOSIS — H25813 Combined forms of age-related cataract, bilateral: Secondary | ICD-10-CM | POA: Diagnosis not present

## 2017-08-29 ENCOUNTER — Ambulatory Visit (INDEPENDENT_AMBULATORY_CARE_PROVIDER_SITE_OTHER): Payer: PPO | Admitting: Sports Medicine

## 2017-08-29 DIAGNOSIS — M79674 Pain in right toe(s): Secondary | ICD-10-CM

## 2017-08-29 DIAGNOSIS — Z9889 Other specified postprocedural states: Secondary | ICD-10-CM | POA: Diagnosis not present

## 2017-08-29 MED ORDER — LIDOCAINE-PRILOCAINE 2.5-2.5 % EX CREA: 1.0000 "application " | TOPICAL_CREAM | CUTANEOUS | 0 refills | Status: DC | PRN

## 2017-08-29 NOTE — Progress Notes (Signed)
Subjective: Hayley Walters is a 71 y.o. female patient returns to office today for follow up evaluation after having Right Hallux medial and lateral permanent nail avulsions performed on 12-06-16. Patient states that the sides of her nails have healed very well now has a sharp pain in the tip of the toe, especially when she is trying to wear closed toed shoes. Patient deniesfever/chills/nausea/vomitting/any other related constitutional symptoms at this time.  Patient Active Problem List   Diagnosis Date Noted  . Iliotibial band syndrome of left side 11/23/2015  . Iliotibial band syndrome of right side 11/23/2015  . Lumbar radiculopathy 11/23/2015  . Acute carpal tunnel syndrome of right wrist 07/21/2015  . Fracture of radius, distal, right, closed 07/21/2015  . Hip joint pain 08/12/2013    Current Outpatient Prescriptions on File Prior to Visit  Medication Sig Dispense Refill  . ALPRAZolam (XANAX) 0.5 MG tablet Take 0.5 mg by mouth at bedtime.    . calcium carbonate (CALCIUM 600) 600 MG TABS tablet Take 600 mg by mouth daily.    . cephALEXin (KEFLEX) 500 MG capsule Take 1 capsule (500 mg total) by mouth 3 (three) times daily. 30 capsule 0  . Cholecalciferol (VITAMIN D3) 2000 UNITS TABS Take 2,000 Units by mouth daily.     . ondansetron (ZOFRAN) 4 MG tablet Take 1 tablet (4 mg total) by mouth every 8 (eight) hours as needed for nausea or vomiting. (Patient not taking: Reported on 02/02/2016) 30 tablet 0  . oxyCODONE-acetaminophen (PERCOCET) 10-325 MG per tablet Take 1-2 tablets by mouth every 6 (six) hours as needed for pain. (Patient not taking: Reported on 02/02/2016) 50 tablet 0  . phentermine (ADIPEX-P) 37.5 MG tablet Take 37.5 mg by mouth daily.  0  . rizatriptan (MAXALT-MLT) 10 MG disintegrating tablet Take 10 mg by mouth See admin instructions. Take 1 tablet (10 mg) by mouth as needed for migraines, may repeat in 2 hours if needed  3  . sennosides-docusate sodium (SENOKOT-S) 8.6-50 MG tablet  Take 2 tablets by mouth daily. (Patient not taking: Reported on 02/02/2016) 30 tablet 1  . sertraline (ZOLOFT) 100 MG tablet Take 150 mg by mouth daily.  3  . topiramate (TOPAMAX) 50 MG tablet Take 50 mg by mouth 2 (two) times daily.    . traMADol (ULTRAM) 50 MG tablet Take 50 mg by mouth every 8 (eight) hours as needed for moderate pain.     No current facility-administered medications on file prior to visit.     No Known Allergies  Objective:  General: Well developed, nourished, in no acute distress, alert and oriented x3   Dermatology: Skin is warm, dry and supple bilateral. Right hallux medial and lateral nail beds appear to be clean, dry, and well healed. Mild reactive callus at the right first toe distal tuft medial aspect. (-) Erythema. (-) Edema. (-) serosanguous drainage present. The remaining nails appear unremarkable at this time. There are no other lesions or other signs of infection present.  Neurovascular status: Intact. No lower extremity swelling; No pain with calf compression bilateral.  Musculoskeletal: No tenderness to right hallux nail margins. There is pain with palpation to the distal tuft, right where the distal edge of the nail meets the distal skin edge of the first toe with mild reactive callus. Muscular strength within normal limits bilateral.   Assesement and Plan: Problem List Items Addressed This Visit    None    Visit Diagnoses    S/P nail surgery    -  Primary   Relevant Medications   lidocaine-prilocaine (EMLA) cream   Toe pain, right       Relevant Medications   lidocaine-prilocaine (EMLA) cream     -Examined patient  -Discussed with patient the Possibility of subungual bone spur that could be contributing to pain and symptoms and also the length of that she lets her nails grow out a also be contributing to her symptoms -Recommend to keep nail and reactive callus trimmed short -Prescribed EMLA cream to use as needed for pain to toe -Advised patient  if pain is no better after a few weeks to return for an x-ray to evaluate for possible subungual bone spur that could be contributing to patient's pain since 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  Landis Martins, DPM

## 2017-09-19 ENCOUNTER — Ambulatory Visit (INDEPENDENT_AMBULATORY_CARE_PROVIDER_SITE_OTHER): Payer: PPO | Admitting: Sports Medicine

## 2017-09-19 ENCOUNTER — Encounter: Payer: Self-pay | Admitting: Sports Medicine

## 2017-09-19 DIAGNOSIS — Z9889 Other specified postprocedural states: Secondary | ICD-10-CM

## 2017-09-19 DIAGNOSIS — M79674 Pain in right toe(s): Secondary | ICD-10-CM | POA: Diagnosis not present

## 2017-09-19 NOTE — Progress Notes (Signed)
Subjective: Hayley Walters is a 71 y.o. female patient returns to office today for follow up evaluation after having Right Hallux medial and lateral permanent nail avulsions performed on 12-06-16. Patient states that she still has a sharp pain in the tip of the toe, especially when she is trying to wear closed toed shoes, states she has been using cream and got new shoes but it still hurts. Patient deniesfever/chills/nausea/vomitting/any other related constitutional symptoms at this time.  Patient Active Problem List   Diagnosis Date Noted  . Iliotibial band syndrome of left side 11/23/2015  . Iliotibial band syndrome of right side 11/23/2015  . Lumbar radiculopathy 11/23/2015  . Acute carpal tunnel syndrome of right wrist 07/21/2015  . Fracture of radius, distal, right, closed 07/21/2015  . Hip joint pain 08/12/2013    Current Outpatient Prescriptions on File Prior to Visit  Medication Sig Dispense Refill  . ALPRAZolam (XANAX) 0.5 MG tablet Take 0.5 mg by mouth at bedtime.    . calcium carbonate (CALCIUM 600) 600 MG TABS tablet Take 600 mg by mouth daily.    . cephALEXin (KEFLEX) 500 MG capsule Take 1 capsule (500 mg total) by mouth 3 (three) times daily. 30 capsule 0  . Cholecalciferol (VITAMIN D3) 2000 UNITS TABS Take 2,000 Units by mouth daily.     Marland Kitchen lidocaine-prilocaine (EMLA) cream Apply 1 application topically as needed. For toe pain 30 g 0  . ondansetron (ZOFRAN) 4 MG tablet Take 1 tablet (4 mg total) by mouth every 8 (eight) hours as needed for nausea or vomiting. (Patient not taking: Reported on 02/02/2016) 30 tablet 0  . oxyCODONE-acetaminophen (PERCOCET) 10-325 MG per tablet Take 1-2 tablets by mouth every 6 (six) hours as needed for pain. (Patient not taking: Reported on 02/02/2016) 50 tablet 0  . phentermine (ADIPEX-P) 37.5 MG tablet Take 37.5 mg by mouth daily.  0  . rizatriptan (MAXALT-MLT) 10 MG disintegrating tablet Take 10 mg by mouth See admin instructions. Take 1 tablet (10 mg)  by mouth as needed for migraines, may repeat in 2 hours if needed  3  . sennosides-docusate sodium (SENOKOT-S) 8.6-50 MG tablet Take 2 tablets by mouth daily. (Patient not taking: Reported on 02/02/2016) 30 tablet 1  . sertraline (ZOLOFT) 100 MG tablet Take 150 mg by mouth daily.  3  . topiramate (TOPAMAX) 50 MG tablet Take 50 mg by mouth 2 (two) times daily.    . traMADol (ULTRAM) 50 MG tablet Take 50 mg by mouth every 8 (eight) hours as needed for moderate pain.     No current facility-administered medications on file prior to visit.     No Known Allergies  Objective:  General: Well developed, nourished, in no acute distress, alert and oriented x3   Dermatology: Skin is warm, dry and supple bilateral. Right hallux medial and lateral nail beds appear to be clean, dry, and well healed. Mild reactive callus at the right first toe distal tuft medial aspect. (-) Erythema. (-) Edema. (-) serosanguous drainage present. The remaining nails appear unremarkable at this time. There are no other lesions or other signs of infection present.  Neurovascular status: Intact. No lower extremity swelling; No pain with calf compression bilateral.  Musculoskeletal: No tenderness to right hallux nail margins. There is pain with palpation to the distal tuft, right where the distal edge of the nail meets the distal skin edge of the first toe with mild reactive callus. Muscular strength within normal limits bilateral.   Assesement and Plan: Problem  List Items Addressed This Visit    None    Visit Diagnoses    S/P nail surgery    -  Primary   Toe pain, right         -Examined patient  -Re-Discussed with patient the Possibility of subungual bone spur that could be contributing to pain and symptoms and also the length of that she lets her nails grow out a also be contributing to her symptoms. 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 If bone spur is present will plan to do surgery paper work for exostectomy.  Landis Martins, DPM

## 2017-09-22 DIAGNOSIS — Z1231 Encounter for screening mammogram for malignant neoplasm of breast: Secondary | ICD-10-CM | POA: Diagnosis not present

## 2017-09-22 DIAGNOSIS — M810 Age-related osteoporosis without current pathological fracture: Secondary | ICD-10-CM | POA: Diagnosis not present

## 2017-09-24 DIAGNOSIS — G43909 Migraine, unspecified, not intractable, without status migrainosus: Secondary | ICD-10-CM | POA: Diagnosis not present

## 2017-09-24 DIAGNOSIS — M810 Age-related osteoporosis without current pathological fracture: Secondary | ICD-10-CM | POA: Diagnosis not present

## 2017-09-24 DIAGNOSIS — Z6832 Body mass index (BMI) 32.0-32.9, adult: Secondary | ICD-10-CM | POA: Diagnosis not present

## 2017-10-03 ENCOUNTER — Ambulatory Visit (INDEPENDENT_AMBULATORY_CARE_PROVIDER_SITE_OTHER): Payer: PPO | Admitting: Sports Medicine

## 2017-10-03 ENCOUNTER — Ambulatory Visit (INDEPENDENT_AMBULATORY_CARE_PROVIDER_SITE_OTHER): Payer: PPO

## 2017-10-03 ENCOUNTER — Other Ambulatory Visit: Payer: Self-pay | Admitting: Sports Medicine

## 2017-10-03 DIAGNOSIS — M2021 Hallux rigidus, right foot: Secondary | ICD-10-CM

## 2017-10-03 DIAGNOSIS — M205X1 Other deformities of toe(s) (acquired), right foot: Secondary | ICD-10-CM

## 2017-10-03 DIAGNOSIS — M79674 Pain in right toe(s): Secondary | ICD-10-CM

## 2017-10-03 DIAGNOSIS — M898X7 Other specified disorders of bone, ankle and foot: Secondary | ICD-10-CM

## 2017-10-03 NOTE — Progress Notes (Signed)
Subjective: Hayley Walters is a 71 y.o. female patient returns to office today for follow up evaluation after having Right Hallux medial and lateral permanent nail avulsions performed on 12-06-16. Patient states that she still has a sharp pain in the tip of the toe, especially when she is trying to wear closed toed shoes, states she has been using cream and got new shoes but it still hurts.  Reports also that she had a flareup of pain in the big toe joints and would like to discuss treatment options.  Patient deniesfever/chills/nausea/vomitting/any other related constitutional symptoms at this time.  Patient Active Problem List   Diagnosis Date Noted  . Iliotibial band syndrome of left side 11/23/2015  . Iliotibial band syndrome of right side 11/23/2015  . Lumbar radiculopathy 11/23/2015  . Acute carpal tunnel syndrome of right wrist 07/21/2015  . Fracture of radius, distal, right, closed 07/21/2015  . Hip joint pain 08/12/2013    Current Outpatient Prescriptions on File Prior to Visit  Medication Sig Dispense Refill  . ALPRAZolam (XANAX) 0.5 MG tablet Take 0.5 mg by mouth at bedtime.    . calcium carbonate (CALCIUM 600) 600 MG TABS tablet Take 600 mg by mouth daily.    . cephALEXin (KEFLEX) 500 MG capsule Take 1 capsule (500 mg total) by mouth 3 (three) times daily. 30 capsule 0  . Cholecalciferol (VITAMIN D3) 2000 UNITS TABS Take 2,000 Units by mouth daily.     Marland Kitchen lidocaine-prilocaine (EMLA) cream Apply 1 application topically as needed. For toe pain 30 g 0  . ondansetron (ZOFRAN) 4 MG tablet Take 1 tablet (4 mg total) by mouth every 8 (eight) hours as needed for nausea or vomiting. 30 tablet 0  . oxyCODONE-acetaminophen (PERCOCET) 10-325 MG per tablet Take 1-2 tablets by mouth every 6 (six) hours as needed for pain. 50 tablet 0  . phentermine (ADIPEX-P) 37.5 MG tablet Take 37.5 mg by mouth daily.  0  . rizatriptan (MAXALT-MLT) 10 MG disintegrating tablet Take 10 mg by mouth See admin  instructions. Take 1 tablet (10 mg) by mouth as needed for migraines, may repeat in 2 hours if needed  3  . sennosides-docusate sodium (SENOKOT-S) 8.6-50 MG tablet Take 2 tablets by mouth daily. 30 tablet 1  . sertraline (ZOLOFT) 100 MG tablet Take 150 mg by mouth daily.  3  . topiramate (TOPAMAX) 50 MG tablet Take 50 mg by mouth 2 (two) times daily.    . traMADol (ULTRAM) 50 MG tablet Take 50 mg by mouth every 8 (eight) hours as needed for moderate pain.     No current facility-administered medications on file prior to visit.     No Known Allergies  Objective:  General: Well developed, nourished, in no acute distress, alert and oriented x3   Dermatology: Skin is warm, dry and supple bilateral. Right hallux medial and lateral nail beds appear to be clean, dry, and well healed. Mild reactive callus at the right first toe distal tuft medial aspect. (-) Erythema. (-) Edema. (-) serosanguous drainage present. The remaining nails appear unremarkable at this time. There are no other lesions or other signs of infection present.  Neurovascular status: Intact. No lower extremity swelling; No pain with calf compression bilateral.  Musculoskeletal: No tenderness to right hallux nail margins. There is pain with palpation to the distal tuft, right where the distal edge of the nail meets the distal skin edge of the first toe with mild reactive callus.  There is significant first metatarsal phalangeal  joint arthritis with dorsal bone spurs and limited range of motion therefore increased hallux extensors deformity with overuse of the interphalangeal joint contributing to with malposition of the toe right greater than left, muscular strength within normal limits bilateral.   Assesement and Plan: Problem List Items Addressed This Visit    None    Visit Diagnoses    Toe pain, right    -  Primary   Relevant Orders   DG Foot Complete Right   Exostosis of toe       Hallux rigidus of right foot       Hallux  extensus, acquired, right         -Examined patient  -Re-Discussed with patient the Possibility of subungual bone spur that could be contributing to pain and symptoms with also likely secondary deformity of hallux extensors secondary to arthritis of the big toe joint -Patient opt for surgical management. Consent obtained for removal of bone spur at distal phalanx of hallux and aggressive cheilectomy right first metatarsal phalangeal joint.  Pre and Post op course explained. Risks, benefits, alternatives explained. No guarantees given or implied. Surgical booking slip submitted and provided patient with Surgical packet and info for [facility]. -Dispensed surgical shoe to use post op -Recommend patient to continue with comfortable shoes until time for surgery and continue with EMLA cream as needed for pain until time for surgery -Patient to return to the office after surgery or sooner if problems or issues arise.  Landis Martins, DPM

## 2017-10-03 NOTE — Patient Instructions (Signed)
Pre-Operative Instructions  Congratulations, you have decided to take an important step towards improving your quality of life.  You can be assured that the doctors and staff at [facility] will be with you every step of the way.  Here are some important things you should know:  1. Plan to be at the surgery center/hospital at least 1 (one) hour prior to your scheduled time, unless otherwise directed by the surgical center/hospital staff.  You must have a responsible adult accompany you, remain during the surgery and drive you home.  Make sure you have directions to the surgical center/hospital to ensure you arrive on time. 2. If you are having surgery at [facility] or [facility] hospitals, you will need a copy of your medical history and physical form from your family physician within one month prior to the date of surgery. We will give you a form for your primary physician to complete.  3. We make every effort to accommodate the date you request for surgery.  However, there are times where surgery dates or times have to be moved.  We will contact you as soon as possible if a change in schedule is required.   4. No aspirin/ibuprofen for one week before surgery.  If you are on aspirin, any non-steroidal anti-inflammatory medications (Mobic, Aleve, Ibuprofen) should not be taken seven (7) days prior to your surgery.  You make take Tylenol for pain prior to surgery.  5. Medications - If you are taking daily heart and blood pressure medications, seizure, reflux, allergy, asthma, anxiety, pain or diabetes medications, make sure you notify the surgery center/hospital before the day of surgery so they can tell you which medications you should take or avoid the day of surgery. 6. No food or drink after midnight the night before surgery unless directed otherwise by surgical center/hospital staff. 7. No alcoholic beverages 24-hours prior to surgery.  No smoking 24-hours prior or 24-hours after  surgery. 8. Wear loose pants or shorts. They should be loose enough to fit over bandages, boots, and casts. 9. Don't wear slip-on shoes. Sneakers are preferred. 10. Bring your boot with you to the surgery center/hospital.  Also bring crutches or a walker if your physician has prescribed it for you.  If you do not have this equipment, it will be provided for you after surgery. 11. If you have not been contacted by the surgery center/hospital by the day before your surgery, call to confirm the date and time of your surgery. 12. Leave-time from work may vary depending on the type of surgery you have.  Appropriate arrangements should be made prior to surgery with your employer. 13. Prescriptions will be provided immediately following surgery by your doctor.  Fill these as soon as possible after surgery and take the medication as directed. Pain medications will not be refilled on weekends and must be approved by the doctor. 14. Remove nail polish on the operative foot and avoid getting pedicures prior to surgery. 15. Wash the night before surgery.  The night before surgery wash the foot and leg well with water and the antibacterial soap provided. Be sure to pay special attention to beneath the toenails and in between the toes.  Wash for at least three (3) minutes. Rinse thoroughly with water and dry well with a towel.  Perform this wash unless told not to do so by your physician.  Enclosed: 1 Ice pack (please put in freezer the night before surgery)   1 Hibiclens skin cleaner     Pre-op instructions  If you have any questions regarding the instructions, please do not hesitate to call our office.  [place]: [address], [place], [place] [address] -- [phone]  [place]: [address] [place], [place] [address] -- [phone]  [place]: [address]  [place], [place] [address] -- [phone]  [place]: [address], [address], [place], [place] [address] -- [phone]  Website:  [email or web address] 

## 2017-10-06 ENCOUNTER — Telehealth: Payer: Self-pay | Admitting: *Deleted

## 2017-10-06 NOTE — Telephone Encounter (Signed)
"  I am a patient of Dr. Cannon Kettle.  She asked me to give you a call to be scheduled for surgery.  Would you please call me."

## 2017-10-07 NOTE — Telephone Encounter (Signed)
"  Dr. Cannon Kettle asked me to give you a call to be scheduled for surgery.  Please call me.  I need to be scheduled at her earliest convenience at the [place] location on [address]."

## 2017-10-07 NOTE — Telephone Encounter (Addendum)
I attempted to return her call.  I left her a message to call me tomorrow. 

## 2017-10-08 NOTE — Telephone Encounter (Signed)
I am returning your call.  You want to schedule surgery?  "Yes, I think I want to schedule it after Christmas, now that I have thought about it.  She can do it on December 08, 2017.  "That date will be fine.  I will go online and register.  If I change my mind and decide to do it sooner, does she have anything available like the first couple of weeks of December?"  Yes, she has time available during that time.  "I will call you back if I change my mind."

## 2017-10-10 ENCOUNTER — Telehealth: Payer: Self-pay | Admitting: *Deleted

## 2017-10-10 NOTE — Telephone Encounter (Signed)
"  We spoke yesterday about scheduling my appointment for surgery with Dr. Cannon Kettle.  We scheduled it for January 7.  I wanted to see if she is doing surgery on Monday, December 31.  Could you give me a call?"

## 2017-10-13 DIAGNOSIS — E559 Vitamin D deficiency, unspecified: Secondary | ICD-10-CM | POA: Diagnosis not present

## 2017-10-13 DIAGNOSIS — R5383 Other fatigue: Secondary | ICD-10-CM | POA: Diagnosis not present

## 2017-10-13 DIAGNOSIS — M810 Age-related osteoporosis without current pathological fracture: Secondary | ICD-10-CM | POA: Diagnosis not present

## 2017-10-14 NOTE — Telephone Encounter (Signed)
I attempted to return her call.  I called her home an work number.  I left a message at her home number to return my call and I tried to call her work phone number and was informed that she does not work there anymore.

## 2017-10-15 DIAGNOSIS — M810 Age-related osteoporosis without current pathological fracture: Secondary | ICD-10-CM | POA: Diagnosis not present

## 2017-10-21 NOTE — Telephone Encounter (Signed)
I left the patient a message that Dr. Cannon Kettle will not be doing any procedures on December 31.  I also stated I was going to leave her appointment scheduled for January 7.  I asked her to call if she wanted to reschedule to another date.

## 2017-12-08 ENCOUNTER — Encounter: Payer: Self-pay | Admitting: Sports Medicine

## 2017-12-08 DIAGNOSIS — M25774 Osteophyte, right foot: Secondary | ICD-10-CM | POA: Diagnosis not present

## 2017-12-08 DIAGNOSIS — M2021 Hallux rigidus, right foot: Secondary | ICD-10-CM | POA: Diagnosis not present

## 2017-12-08 DIAGNOSIS — M2570 Osteophyte, unspecified joint: Secondary | ICD-10-CM | POA: Diagnosis not present

## 2017-12-08 DIAGNOSIS — D1631 Benign neoplasm of short bones of right lower limb: Secondary | ICD-10-CM | POA: Diagnosis not present

## 2017-12-08 DIAGNOSIS — R0683 Snoring: Secondary | ICD-10-CM | POA: Diagnosis not present

## 2017-12-09 ENCOUNTER — Telehealth: Payer: Self-pay | Admitting: Sports Medicine

## 2017-12-09 NOTE — Telephone Encounter (Signed)
Post op check phone call made to patient. Patient did not answer. Left voicemail for patient to call office back if there are any problems or concerns. -Dr. Cannon Kettle

## 2017-12-16 NOTE — Progress Notes (Signed)
DOS 01.07.2019 Removal of bone spur underneath nail at right 1st toe and clean up arthritis/remove bone spurs at big toe joint on right.

## 2017-12-17 ENCOUNTER — Ambulatory Visit (INDEPENDENT_AMBULATORY_CARE_PROVIDER_SITE_OTHER): Payer: PPO | Admitting: Sports Medicine

## 2017-12-17 ENCOUNTER — Encounter: Payer: Self-pay | Admitting: Sports Medicine

## 2017-12-17 ENCOUNTER — Ambulatory Visit (INDEPENDENT_AMBULATORY_CARE_PROVIDER_SITE_OTHER): Payer: PPO

## 2017-12-17 DIAGNOSIS — M2021 Hallux rigidus, right foot: Secondary | ICD-10-CM

## 2017-12-17 DIAGNOSIS — Z9889 Other specified postprocedural states: Secondary | ICD-10-CM

## 2017-12-17 DIAGNOSIS — M898X7 Other specified disorders of bone, ankle and foot: Secondary | ICD-10-CM

## 2017-12-17 DIAGNOSIS — M79674 Pain in right toe(s): Secondary | ICD-10-CM

## 2017-12-17 DIAGNOSIS — M205X1 Other deformities of toe(s) (acquired), right foot: Secondary | ICD-10-CM

## 2017-12-17 NOTE — Progress Notes (Signed)
Subjective: Hayley Walters is a 72 y.o. female patient seen today in office for POV #1 (DOS 12-08-17), S/P Right cheilectomy . Patient denies pain at surgical site, denies calf pain, denies headache, chest pain, shortness of breath, nausea, vomiting, fever, or chills. Patient states that she is doing well and did not have to take any medications since postop day 1. No other issues noted.   Patient Active Problem List   Diagnosis Date Noted  . Iliotibial band syndrome of left side 11/23/2015  . Iliotibial band syndrome of right side 11/23/2015  . Lumbar radiculopathy 11/23/2015  . Acute carpal tunnel syndrome of right wrist 07/21/2015  . Fracture of radius, distal, right, closed 07/21/2015  . Hip joint pain 08/12/2013    Current Outpatient Medications on File Prior to Visit  Medication Sig Dispense Refill  . ALPRAZolam (XANAX) 0.5 MG tablet Take 0.5 mg by mouth at bedtime.    . calcium carbonate (CALCIUM 600) 600 MG TABS tablet Take 600 mg by mouth daily.    . cephALEXin (KEFLEX) 500 MG capsule Take 1 capsule (500 mg total) by mouth 3 (three) times daily. 30 capsule 0  . Cholecalciferol (VITAMIN D3) 2000 UNITS TABS Take 2,000 Units by mouth daily.     Marland Kitchen docusate sodium (COLACE) 100 MG capsule Take 100 mg by mouth 2 (two) times daily.    Marland Kitchen HYDROcodone-acetaminophen (NORCO/VICODIN) 5-325 MG tablet Take 1 tablet by mouth every 6 (six) hours as needed for moderate pain (Take 1 tablet by bouth every 6 hours as needed for pain).    Marland Kitchen lidocaine-prilocaine (EMLA) cream Apply 1 application topically as needed. For toe pain 30 g 0  . ondansetron (ZOFRAN) 4 MG tablet Take 1 tablet (4 mg total) by mouth every 8 (eight) hours as needed for nausea or vomiting. 30 tablet 0  . oxyCODONE-acetaminophen (PERCOCET) 10-325 MG per tablet Take 1-2 tablets by mouth every 6 (six) hours as needed for pain. 50 tablet 0  . phentermine (ADIPEX-P) 37.5 MG tablet Take 37.5 mg by mouth daily.  0  . rizatriptan (MAXALT-MLT) 10  MG disintegrating tablet Take 10 mg by mouth See admin instructions. Take 1 tablet (10 mg) by mouth as needed for migraines, may repeat in 2 hours if needed  3  . sennosides-docusate sodium (SENOKOT-S) 8.6-50 MG tablet Take 2 tablets by mouth daily. 30 tablet 1  . sertraline (ZOLOFT) 100 MG tablet Take 150 mg by mouth daily.  3  . topiramate (TOPAMAX) 50 MG tablet Take 50 mg by mouth 2 (two) times daily.    . traMADol (ULTRAM) 50 MG tablet Take 50 mg by mouth every 8 (eight) hours as needed for moderate pain.     No current facility-administered medications on file prior to visit.     No Known Allergies  Objective: There were no vitals filed for this visit.  General: No acute distress, AAOx3  Right foot: Sutures intact with no gapping or dehiscence at surgical site, mild swelling to first metatarsophalangeal joint on right, no erythema, no warmth, no drainage, no signs of infection noted, Capillary fill time <3 seconds in all digits, gross sensation present via light touch to right foot. No pain or crepitation with range of motion right foot.  No pain with calf compression.   Post Op Xray, Right foot: Osseous changes consistent with postop status. Soft tissue swelling within normal limits for post op status.   Assessment and Plan:  Problem List Items Addressed This Visit  None    Visit Diagnoses    Hallux rigidus of right foot    -  Primary   Relevant Orders   DG Foot Complete Right   Hallux extensus, acquired, right       Relevant Orders   DG Foot Complete Right   Exostosis of toe       Toe pain, right       S/P foot surgery, right           -Patient seen and evaluated -X-rays reviewed -Applied dry sterile dressing to surgical site right foot secured with ACE wrap and stockinet  -Advised patient to make sure to keep dressings clean, dry, and intact to right surgical site, removing the ACE as needed  -Advised patient to continue with post-op shoe on right foot   -Advised  patient to limit activity to necessity  -Advised patient to ice and elevate as necessary  -Will plan for suture removal at next office visit. In the meantime, patient to call office if any issues or problems arise.   Landis Martins, DPM

## 2017-12-24 ENCOUNTER — Encounter: Payer: Self-pay | Admitting: Sports Medicine

## 2017-12-24 ENCOUNTER — Ambulatory Visit (INDEPENDENT_AMBULATORY_CARE_PROVIDER_SITE_OTHER): Payer: PPO | Admitting: Sports Medicine

## 2017-12-24 DIAGNOSIS — M2021 Hallux rigidus, right foot: Secondary | ICD-10-CM

## 2017-12-24 DIAGNOSIS — M205X1 Other deformities of toe(s) (acquired), right foot: Secondary | ICD-10-CM

## 2017-12-24 DIAGNOSIS — Z9889 Other specified postprocedural states: Secondary | ICD-10-CM

## 2017-12-24 DIAGNOSIS — M79674 Pain in right toe(s): Secondary | ICD-10-CM

## 2017-12-24 DIAGNOSIS — M898X7 Other specified disorders of bone, ankle and foot: Secondary | ICD-10-CM

## 2017-12-24 NOTE — Progress Notes (Signed)
Subjective: Hayley Walters is a 72 y.o. female patient seen today in office for POV #2 (DOS 12-08-17), S/P Right cheilectomy . Patient denies pain at surgical site, denies calf pain, denies headache, chest pain, shortness of breath, nausea, vomiting, fever, or chills. Patient states that she is doing well and is amazed that she doesn't have any pain. No other issues noted.   Patient Active Problem List   Diagnosis Date Noted  . Iliotibial band syndrome of left side 11/23/2015  . Iliotibial band syndrome of right side 11/23/2015  . Lumbar radiculopathy 11/23/2015  . Acute carpal tunnel syndrome of right wrist 07/21/2015  . Fracture of radius, distal, right, closed 07/21/2015  . Hip joint pain 08/12/2013    Current Outpatient Medications on File Prior to Visit  Medication Sig Dispense Refill  . ALPRAZolam (XANAX) 0.5 MG tablet Take 0.5 mg by mouth at bedtime.    . calcium carbonate (CALCIUM 600) 600 MG TABS tablet Take 600 mg by mouth daily.    . cephALEXin (KEFLEX) 500 MG capsule Take 1 capsule (500 mg total) by mouth 3 (three) times daily. 30 capsule 0  . Cholecalciferol (VITAMIN D3) 2000 UNITS TABS Take 2,000 Units by mouth daily.     Marland Kitchen docusate sodium (COLACE) 100 MG capsule Take 100 mg by mouth 2 (two) times daily.    Marland Kitchen HYDROcodone-acetaminophen (NORCO/VICODIN) 5-325 MG tablet Take 1 tablet by mouth every 6 (six) hours as needed for moderate pain (Take 1 tablet by bouth every 6 hours as needed for pain).    Marland Kitchen lidocaine-prilocaine (EMLA) cream Apply 1 application topically as needed. For toe pain 30 g 0  . ondansetron (ZOFRAN) 4 MG tablet Take 1 tablet (4 mg total) by mouth every 8 (eight) hours as needed for nausea or vomiting. 30 tablet 0  . oxyCODONE-acetaminophen (PERCOCET) 10-325 MG per tablet Take 1-2 tablets by mouth every 6 (six) hours as needed for pain. 50 tablet 0  . phentermine (ADIPEX-P) 37.5 MG tablet Take 37.5 mg by mouth daily.  0  . rizatriptan (MAXALT-MLT) 10 MG  disintegrating tablet Take 10 mg by mouth See admin instructions. Take 1 tablet (10 mg) by mouth as needed for migraines, may repeat in 2 hours if needed  3  . sennosides-docusate sodium (SENOKOT-S) 8.6-50 MG tablet Take 2 tablets by mouth daily. 30 tablet 1  . sertraline (ZOLOFT) 100 MG tablet Take 150 mg by mouth daily.  3  . topiramate (TOPAMAX) 50 MG tablet Take 50 mg by mouth 2 (two) times daily.    . traMADol (ULTRAM) 50 MG tablet Take 50 mg by mouth every 8 (eight) hours as needed for moderate pain.     No current facility-administered medications on file prior to visit.     No Known Allergies  Objective: There were no vitals filed for this visit.  General: No acute distress, AAOx3  Right foot: Sutures intact with no gapping or dehiscence at surgical site, mild swelling to first metatarsophalangeal joint on right, no erythema, no warmth, no drainage, no signs of infection noted, Capillary fill time <3 seconds in all digits, gross sensation present via light touch to right foot. No pain or crepitation with range of motion right foot.  No pain with calf compression.   Assessment and Plan:  Problem List Items Addressed This Visit    None    Visit Diagnoses    Hallux rigidus of right foot    -  Primary   Hallux extensus, acquired, right  Exostosis of toe       Toe pain, right       S/P foot surgery, right           -Patient seen and evaluated -Sutures removed  -Applied dry sterile dressing to surgical site right foot secured with ACE wrap and stockinet  -Advised patient to make sure to keep dressings clean, dry, and intact to right surgical site for today and may remove on tomorrow for shower allowing steristrips to fall off on own  -Advised patient to continue with post-op shoe on right foot and then on tomorrow may slow wean from shoe to tennis shoe as instructed    -Advised patient to limit activity to tolerance  -Advised patient to ice and elevate as necessary  -Will  plan for post op check at next office visit. In the meantime, patient to call office if any issues or problems arise.   Landis Martins, DPM

## 2017-12-26 ENCOUNTER — Other Ambulatory Visit: Payer: Self-pay | Admitting: Sports Medicine

## 2017-12-26 DIAGNOSIS — Z9889 Other specified postprocedural states: Secondary | ICD-10-CM

## 2017-12-26 DIAGNOSIS — M2021 Hallux rigidus, right foot: Secondary | ICD-10-CM

## 2017-12-26 DIAGNOSIS — M205X1 Other deformities of toe(s) (acquired), right foot: Secondary | ICD-10-CM

## 2018-01-08 ENCOUNTER — Encounter: Payer: Self-pay | Admitting: Sports Medicine

## 2018-01-08 ENCOUNTER — Ambulatory Visit (INDEPENDENT_AMBULATORY_CARE_PROVIDER_SITE_OTHER): Payer: PPO | Admitting: Sports Medicine

## 2018-01-08 DIAGNOSIS — M79674 Pain in right toe(s): Secondary | ICD-10-CM

## 2018-01-08 DIAGNOSIS — M898X7 Other specified disorders of bone, ankle and foot: Secondary | ICD-10-CM

## 2018-01-08 DIAGNOSIS — M205X1 Other deformities of toe(s) (acquired), right foot: Secondary | ICD-10-CM

## 2018-01-08 DIAGNOSIS — M2021 Hallux rigidus, right foot: Secondary | ICD-10-CM

## 2018-01-08 DIAGNOSIS — Z9889 Other specified postprocedural states: Secondary | ICD-10-CM

## 2018-01-08 NOTE — Progress Notes (Signed)
Subjective: Hayley Walters is a 72 y.o. female patient seen today in office for POV #3 (DOS 12-08-17), S/P Right cheilectomy. Patient denies pain at surgical site but admits more pain under the great toe on the bottom that feels bruised, denies calf pain, denies headache, chest pain, shortness of breath, nausea, vomiting, fever, or chills.  No other issues noted.   Patient Active Problem List   Diagnosis Date Noted  . Iliotibial band syndrome of left side 11/23/2015  . Iliotibial band syndrome of right side 11/23/2015  . Lumbar radiculopathy 11/23/2015  . Acute carpal tunnel syndrome of right wrist 07/21/2015  . Fracture of radius, distal, right, closed 07/21/2015  . Hip joint pain 08/12/2013    Current Outpatient Medications on File Prior to Visit  Medication Sig Dispense Refill  . ALPRAZolam (XANAX) 0.5 MG tablet Take 0.5 mg by mouth at bedtime.    . calcium carbonate (CALCIUM 600) 600 MG TABS tablet Take 600 mg by mouth daily.    . cephALEXin (KEFLEX) 500 MG capsule Take 1 capsule (500 mg total) by mouth 3 (three) times daily. 30 capsule 0  . Cholecalciferol (VITAMIN D3) 2000 UNITS TABS Take 2,000 Units by mouth daily.     Marland Kitchen docusate sodium (COLACE) 100 MG capsule Take 100 mg by mouth 2 (two) times daily.    Marland Kitchen HYDROcodone-acetaminophen (NORCO/VICODIN) 5-325 MG tablet Take 1 tablet by mouth every 6 (six) hours as needed for moderate pain (Take 1 tablet by bouth every 6 hours as needed for pain).    Marland Kitchen lidocaine-prilocaine (EMLA) cream Apply 1 application topically as needed. For toe pain 30 g 0  . ondansetron (ZOFRAN) 4 MG tablet Take 1 tablet (4 mg total) by mouth every 8 (eight) hours as needed for nausea or vomiting. 30 tablet 0  . oxyCODONE-acetaminophen (PERCOCET) 10-325 MG per tablet Take 1-2 tablets by mouth every 6 (six) hours as needed for pain. 50 tablet 0  . phentermine (ADIPEX-P) 37.5 MG tablet Take 37.5 mg by mouth daily.  0  . rizatriptan (MAXALT-MLT) 10 MG disintegrating tablet  Take 10 mg by mouth See admin instructions. Take 1 tablet (10 mg) by mouth as needed for migraines, may repeat in 2 hours if needed  3  . sennosides-docusate sodium (SENOKOT-S) 8.6-50 MG tablet Take 2 tablets by mouth daily. 30 tablet 1  . sertraline (ZOLOFT) 100 MG tablet Take 150 mg by mouth daily.  3  . topiramate (TOPAMAX) 50 MG tablet Take 50 mg by mouth 2 (two) times daily.    . traMADol (ULTRAM) 50 MG tablet Take 50 mg by mouth every 8 (eight) hours as needed for moderate pain.     No current facility-administered medications on file prior to visit.     No Known Allergies  Objective: There were no vitals filed for this visit.  General: No acute distress, AAOx3  Right foot: Incision well healed at surgical site, mild swelling to first metatarsophalangeal joint on right, mild irritation at plantar right hallux with no blister, just blanchable erythema, no warmth, no drainage, no signs of infection noted, Capillary fill time <3 seconds in all digits, gross sensation present via light touch to right foot. No pain or crepitation with range of motion right foot. + Tenderness to right plantar hallux.  No pain with calf compression.   Assessment and Plan:  Problem List Items Addressed This Visit    None    Visit Diagnoses    Hallux rigidus of right foot    -  Primary   Hallux extensus, acquired, right       Exostosis of toe       Toe pain, right       S/P foot surgery, right           -Patient seen and evaluated -Dispensed toe foam -Applied metatarsal padding  -Advised patient to limit activity to tolerance  -Advised patient to ice and elevate as necessary  -Encouraged scar creams and range of motion exercises  -Will plan for post op check to see if patient can discontinue toe foam at next office visit. In the meantime, patient to call office if any issues or problems arise.   Landis Martins, DPM

## 2018-01-12 DIAGNOSIS — Z1231 Encounter for screening mammogram for malignant neoplasm of breast: Secondary | ICD-10-CM | POA: Diagnosis not present

## 2018-01-12 DIAGNOSIS — Z136 Encounter for screening for cardiovascular disorders: Secondary | ICD-10-CM | POA: Diagnosis not present

## 2018-01-12 DIAGNOSIS — E669 Obesity, unspecified: Secondary | ICD-10-CM | POA: Diagnosis not present

## 2018-01-12 DIAGNOSIS — Z9181 History of falling: Secondary | ICD-10-CM | POA: Diagnosis not present

## 2018-01-12 DIAGNOSIS — Z0000 Encounter for general adult medical examination without abnormal findings: Secondary | ICD-10-CM | POA: Diagnosis not present

## 2018-01-12 DIAGNOSIS — Z1211 Encounter for screening for malignant neoplasm of colon: Secondary | ICD-10-CM | POA: Diagnosis not present

## 2018-01-12 DIAGNOSIS — Z1331 Encounter for screening for depression: Secondary | ICD-10-CM | POA: Diagnosis not present

## 2018-01-12 DIAGNOSIS — Z139 Encounter for screening, unspecified: Secondary | ICD-10-CM | POA: Diagnosis not present

## 2018-01-12 DIAGNOSIS — Z6830 Body mass index (BMI) 30.0-30.9, adult: Secondary | ICD-10-CM | POA: Diagnosis not present

## 2018-01-12 DIAGNOSIS — E785 Hyperlipidemia, unspecified: Secondary | ICD-10-CM | POA: Diagnosis not present

## 2018-01-22 DIAGNOSIS — K921 Melena: Secondary | ICD-10-CM | POA: Diagnosis not present

## 2018-01-22 DIAGNOSIS — R1032 Left lower quadrant pain: Secondary | ICD-10-CM | POA: Diagnosis not present

## 2018-01-22 DIAGNOSIS — R1013 Epigastric pain: Secondary | ICD-10-CM | POA: Diagnosis not present

## 2018-01-22 DIAGNOSIS — K644 Residual hemorrhoidal skin tags: Secondary | ICD-10-CM | POA: Diagnosis not present

## 2018-01-26 DIAGNOSIS — J208 Acute bronchitis due to other specified organisms: Secondary | ICD-10-CM | POA: Diagnosis not present

## 2018-01-29 ENCOUNTER — Encounter: Payer: PPO | Admitting: Sports Medicine

## 2018-02-05 ENCOUNTER — Encounter: Payer: Self-pay | Admitting: Sports Medicine

## 2018-02-05 ENCOUNTER — Ambulatory Visit (INDEPENDENT_AMBULATORY_CARE_PROVIDER_SITE_OTHER): Payer: PPO | Admitting: Sports Medicine

## 2018-02-05 DIAGNOSIS — M779 Enthesopathy, unspecified: Secondary | ICD-10-CM

## 2018-02-05 DIAGNOSIS — M205X1 Other deformities of toe(s) (acquired), right foot: Secondary | ICD-10-CM

## 2018-02-05 DIAGNOSIS — M2021 Hallux rigidus, right foot: Secondary | ICD-10-CM

## 2018-02-05 DIAGNOSIS — M898X7 Other specified disorders of bone, ankle and foot: Secondary | ICD-10-CM

## 2018-02-05 DIAGNOSIS — Z9889 Other specified postprocedural states: Secondary | ICD-10-CM

## 2018-02-05 DIAGNOSIS — M79674 Pain in right toe(s): Secondary | ICD-10-CM

## 2018-02-05 MED ORDER — TRIAMCINOLONE ACETONIDE 10 MG/ML IJ SUSP: 10.0000 mg | Freq: Once | INTRAMUSCULAR | Status: DC

## 2018-02-05 NOTE — Progress Notes (Signed)
Subjective: Hayley Walters is a 72 y.o. female patient seen today in office for POV #4 (DOS 12-08-17), S/P Right cheilectomy. Patient denies pain at surgical site but still admits pain under the great toe on the bottom that feels bruised as previous however the swelling and the color change to the skin has improved the toe still feels very sore with extensive walking and standing.  Patient states that the offloading pad that I placed in her shoe helped tremendously and she was able to make it when she went out of town without too much discomfort however it still sore right on the bottom of her toe, denies calf pain, denies headache, chest pain, shortness of breath, nausea, vomiting, fever, or chills.  No other issues noted.   Patient Active Problem List   Diagnosis Date Noted  . Iliotibial band syndrome of left side 11/23/2015  . Iliotibial band syndrome of right side 11/23/2015  . Lumbar radiculopathy 11/23/2015  . Acute carpal tunnel syndrome of right wrist 07/21/2015  . Fracture of radius, distal, right, closed 07/21/2015  . Hip joint pain 08/12/2013    Current Outpatient Medications on File Prior to Visit  Medication Sig Dispense Refill  . ALPRAZolam (XANAX) 0.5 MG tablet Take 0.5 mg by mouth at bedtime.    . calcium carbonate (CALCIUM 600) 600 MG TABS tablet Take 600 mg by mouth daily.    . cephALEXin (KEFLEX) 500 MG capsule Take 1 capsule (500 mg total) by mouth 3 (three) times daily. 30 capsule 0  . Cholecalciferol (VITAMIN D3) 2000 UNITS TABS Take 2,000 Units by mouth daily.     Marland Kitchen docusate sodium (COLACE) 100 MG capsule Take 100 mg by mouth 2 (two) times daily.    Marland Kitchen HYDROcodone-acetaminophen (NORCO/VICODIN) 5-325 MG tablet Take 1 tablet by mouth every 6 (six) hours as needed for moderate pain (Take 1 tablet by bouth every 6 hours as needed for pain).    Marland Kitchen lidocaine-prilocaine (EMLA) cream Apply 1 application topically as needed. For toe pain 30 g 0  . ondansetron (ZOFRAN) 4 MG tablet Take  1 tablet (4 mg total) by mouth every 8 (eight) hours as needed for nausea or vomiting. 30 tablet 0  . oxyCODONE-acetaminophen (PERCOCET) 10-325 MG per tablet Take 1-2 tablets by mouth every 6 (six) hours as needed for pain. 50 tablet 0  . phentermine (ADIPEX-P) 37.5 MG tablet Take 37.5 mg by mouth daily.  0  . rizatriptan (MAXALT-MLT) 10 MG disintegrating tablet Take 10 mg by mouth See admin instructions. Take 1 tablet (10 mg) by mouth as needed for migraines, may repeat in 2 hours if needed  3  . sennosides-docusate sodium (SENOKOT-S) 8.6-50 MG tablet Take 2 tablets by mouth daily. 30 tablet 1  . sertraline (ZOLOFT) 100 MG tablet Take 150 mg by mouth daily.  3  . topiramate (TOPAMAX) 50 MG tablet Take 50 mg by mouth 2 (two) times daily.    . traMADol (ULTRAM) 50 MG tablet Take 50 mg by mouth every 8 (eight) hours as needed for moderate pain.     No current facility-administered medications on file prior to visit.     No Known Allergies  Objective: There were no vitals filed for this visit.  General: No acute distress, AAOx3  Right foot: Surgical site well-healed, mild swelling to first metatarsophalangeal joint on right, mild irritation at plantar right hallux with  blanchable erythema and a focal pinpoint area of swelling that is tender with touch likely representing capsulitis versus  bursitis, no warmth, no drainage, no signs of infection noted, Capillary fill time <3 seconds in all digits, gross sensation present via light touch to right foot. No pain or crepitation with range of motion right foot. + Tenderness to right plantar hallux.  No pain with calf compression.   Assessment and Plan:  Problem List Items Addressed This Visit    None    Visit Diagnoses    Toe pain, right    -  Primary   Capsulitis       Relevant Medications   triamcinolone acetonide (KENALOG) 10 MG/ML injection 10 mg   Hallux rigidus of right foot       Hallux extensus, acquired, right       Exostosis of toe        S/P foot surgery, right           -Patient seen and evaluated -Discussed treatment options for possible capsulitis versus bursitis at the right plantar hallux likely probably secondary to how the patient is ambulating differently after having foot surgery -After oral consent and aseptic prep, injected a mixture containing 1 ml of 2%  plain lidocaine, 1 ml 0.5% plain marcaine, 0.5 ml of kenalog 10 and 0.5 ml of dexamethasone phosphate into right hallux plantar toe without complication. Post-injection care discussed with patient.  -Dispensed silicone toe -Dispensed more metatarsal padding  -Advised patient to limit activity to tolerance  -Advised patient to ice and elevate as necessary  -Encouraged scar creams and range of motion exercises  -Patient to return if symptoms not better after injection to right great toe in 2 weeks.  In the meantime, patient to call office if any issues or problems arise.   Landis Martins, DPM

## 2018-02-10 DIAGNOSIS — Z87891 Personal history of nicotine dependence: Secondary | ICD-10-CM | POA: Diagnosis not present

## 2018-02-10 DIAGNOSIS — K644 Residual hemorrhoidal skin tags: Secondary | ICD-10-CM | POA: Diagnosis not present

## 2018-02-10 DIAGNOSIS — K5790 Diverticulosis of intestine, part unspecified, without perforation or abscess without bleeding: Secondary | ICD-10-CM | POA: Diagnosis not present

## 2018-02-10 DIAGNOSIS — K648 Other hemorrhoids: Secondary | ICD-10-CM | POA: Diagnosis not present

## 2018-02-10 DIAGNOSIS — K5730 Diverticulosis of large intestine without perforation or abscess without bleeding: Secondary | ICD-10-CM | POA: Diagnosis not present

## 2018-02-10 DIAGNOSIS — K635 Polyp of colon: Secondary | ICD-10-CM | POA: Diagnosis not present

## 2018-02-10 DIAGNOSIS — Z86010 Personal history of colonic polyps: Secondary | ICD-10-CM | POA: Diagnosis not present

## 2018-02-10 DIAGNOSIS — D126 Benign neoplasm of colon, unspecified: Secondary | ICD-10-CM | POA: Diagnosis not present

## 2018-02-10 DIAGNOSIS — Z85828 Personal history of other malignant neoplasm of skin: Secondary | ICD-10-CM | POA: Diagnosis not present

## 2018-02-10 DIAGNOSIS — Z1211 Encounter for screening for malignant neoplasm of colon: Secondary | ICD-10-CM | POA: Diagnosis not present

## 2018-02-18 DIAGNOSIS — M25571 Pain in right ankle and joints of right foot: Secondary | ICD-10-CM | POA: Diagnosis not present

## 2018-02-18 DIAGNOSIS — M810 Age-related osteoporosis without current pathological fracture: Secondary | ICD-10-CM | POA: Diagnosis not present

## 2018-03-04 DIAGNOSIS — M25571 Pain in right ankle and joints of right foot: Secondary | ICD-10-CM | POA: Diagnosis not present

## 2018-05-05 DIAGNOSIS — J208 Acute bronchitis due to other specified organisms: Secondary | ICD-10-CM | POA: Diagnosis not present

## 2018-09-29 DIAGNOSIS — H35363 Drusen (degenerative) of macula, bilateral: Secondary | ICD-10-CM | POA: Diagnosis not present

## 2018-09-29 DIAGNOSIS — H25813 Combined forms of age-related cataract, bilateral: Secondary | ICD-10-CM | POA: Diagnosis not present

## 2018-10-02 DIAGNOSIS — K582 Mixed irritable bowel syndrome: Secondary | ICD-10-CM | POA: Diagnosis not present

## 2018-10-02 DIAGNOSIS — Z6830 Body mass index (BMI) 30.0-30.9, adult: Secondary | ICD-10-CM | POA: Diagnosis not present

## 2018-11-04 DIAGNOSIS — H43813 Vitreous degeneration, bilateral: Secondary | ICD-10-CM | POA: Diagnosis not present

## 2018-11-04 DIAGNOSIS — H35371 Puckering of macula, right eye: Secondary | ICD-10-CM | POA: Diagnosis not present

## 2018-11-04 DIAGNOSIS — Q141 Congenital malformation of retina: Secondary | ICD-10-CM | POA: Diagnosis not present

## 2019-01-12 DIAGNOSIS — H2513 Age-related nuclear cataract, bilateral: Secondary | ICD-10-CM | POA: Diagnosis not present

## 2019-01-12 DIAGNOSIS — H2512 Age-related nuclear cataract, left eye: Secondary | ICD-10-CM | POA: Diagnosis not present

## 2019-01-12 DIAGNOSIS — H25043 Posterior subcapsular polar age-related cataract, bilateral: Secondary | ICD-10-CM | POA: Diagnosis not present

## 2019-01-12 DIAGNOSIS — H02831 Dermatochalasis of right upper eyelid: Secondary | ICD-10-CM | POA: Diagnosis not present

## 2019-01-12 DIAGNOSIS — H25013 Cortical age-related cataract, bilateral: Secondary | ICD-10-CM | POA: Diagnosis not present

## 2019-01-12 DIAGNOSIS — H18413 Arcus senilis, bilateral: Secondary | ICD-10-CM | POA: Diagnosis not present

## 2019-01-14 DIAGNOSIS — E669 Obesity, unspecified: Secondary | ICD-10-CM | POA: Diagnosis not present

## 2019-01-14 DIAGNOSIS — Z6832 Body mass index (BMI) 32.0-32.9, adult: Secondary | ICD-10-CM | POA: Diagnosis not present

## 2019-01-14 DIAGNOSIS — E785 Hyperlipidemia, unspecified: Secondary | ICD-10-CM | POA: Diagnosis not present

## 2019-01-14 DIAGNOSIS — Z9181 History of falling: Secondary | ICD-10-CM | POA: Diagnosis not present

## 2019-01-14 DIAGNOSIS — N959 Unspecified menopausal and perimenopausal disorder: Secondary | ICD-10-CM | POA: Diagnosis not present

## 2019-01-14 DIAGNOSIS — Z1231 Encounter for screening mammogram for malignant neoplasm of breast: Secondary | ICD-10-CM | POA: Diagnosis not present

## 2019-01-14 DIAGNOSIS — Z23 Encounter for immunization: Secondary | ICD-10-CM | POA: Diagnosis not present

## 2019-01-14 DIAGNOSIS — Z1331 Encounter for screening for depression: Secondary | ICD-10-CM | POA: Diagnosis not present

## 2019-01-14 DIAGNOSIS — Z139 Encounter for screening, unspecified: Secondary | ICD-10-CM | POA: Diagnosis not present

## 2019-01-14 DIAGNOSIS — Z1339 Encounter for screening examination for other mental health and behavioral disorders: Secondary | ICD-10-CM | POA: Diagnosis not present

## 2019-01-14 DIAGNOSIS — Z0000 Encounter for general adult medical examination without abnormal findings: Secondary | ICD-10-CM | POA: Diagnosis not present

## 2019-01-25 DIAGNOSIS — H2512 Age-related nuclear cataract, left eye: Secondary | ICD-10-CM | POA: Diagnosis not present

## 2019-01-26 DIAGNOSIS — H2511 Age-related nuclear cataract, right eye: Secondary | ICD-10-CM | POA: Diagnosis not present

## 2019-04-12 DIAGNOSIS — H2511 Age-related nuclear cataract, right eye: Secondary | ICD-10-CM | POA: Diagnosis not present

## 2019-05-08 DIAGNOSIS — D509 Iron deficiency anemia, unspecified: Secondary | ICD-10-CM | POA: Diagnosis not present

## 2019-05-08 DIAGNOSIS — H8113 Benign paroxysmal vertigo, bilateral: Secondary | ICD-10-CM | POA: Diagnosis not present

## 2019-06-15 DIAGNOSIS — L309 Dermatitis, unspecified: Secondary | ICD-10-CM | POA: Diagnosis not present

## 2019-06-15 DIAGNOSIS — L821 Other seborrheic keratosis: Secondary | ICD-10-CM | POA: Diagnosis not present

## 2019-12-13 DIAGNOSIS — M8588 Other specified disorders of bone density and structure, other site: Secondary | ICD-10-CM | POA: Diagnosis not present

## 2019-12-13 DIAGNOSIS — M810 Age-related osteoporosis without current pathological fracture: Secondary | ICD-10-CM | POA: Diagnosis not present

## 2019-12-13 DIAGNOSIS — Z1231 Encounter for screening mammogram for malignant neoplasm of breast: Secondary | ICD-10-CM | POA: Diagnosis not present

## 2020-01-18 DIAGNOSIS — Z6833 Body mass index (BMI) 33.0-33.9, adult: Secondary | ICD-10-CM | POA: Diagnosis not present

## 2020-01-18 DIAGNOSIS — E785 Hyperlipidemia, unspecified: Secondary | ICD-10-CM | POA: Diagnosis not present

## 2020-01-18 DIAGNOSIS — E669 Obesity, unspecified: Secondary | ICD-10-CM | POA: Diagnosis not present

## 2020-01-18 DIAGNOSIS — Z0000 Encounter for general adult medical examination without abnormal findings: Secondary | ICD-10-CM | POA: Diagnosis not present

## 2020-01-18 DIAGNOSIS — Z9181 History of falling: Secondary | ICD-10-CM | POA: Diagnosis not present

## 2020-01-18 DIAGNOSIS — Z139 Encounter for screening, unspecified: Secondary | ICD-10-CM | POA: Diagnosis not present

## 2020-01-18 DIAGNOSIS — Z23 Encounter for immunization: Secondary | ICD-10-CM | POA: Diagnosis not present

## 2020-01-18 DIAGNOSIS — Z136 Encounter for screening for cardiovascular disorders: Secondary | ICD-10-CM | POA: Diagnosis not present

## 2020-01-18 DIAGNOSIS — Z1331 Encounter for screening for depression: Secondary | ICD-10-CM | POA: Diagnosis not present

## 2020-02-14 DIAGNOSIS — J3489 Other specified disorders of nose and nasal sinuses: Secondary | ICD-10-CM | POA: Diagnosis not present

## 2020-02-14 DIAGNOSIS — B001 Herpesviral vesicular dermatitis: Secondary | ICD-10-CM | POA: Diagnosis not present

## 2020-02-14 DIAGNOSIS — Z23 Encounter for immunization: Secondary | ICD-10-CM | POA: Diagnosis not present

## 2020-04-27 DIAGNOSIS — J208 Acute bronchitis due to other specified organisms: Secondary | ICD-10-CM | POA: Diagnosis not present

## 2020-04-27 DIAGNOSIS — B9689 Other specified bacterial agents as the cause of diseases classified elsewhere: Secondary | ICD-10-CM | POA: Diagnosis not present

## 2020-05-02 DIAGNOSIS — E559 Vitamin D deficiency, unspecified: Secondary | ICD-10-CM | POA: Diagnosis not present

## 2020-05-02 DIAGNOSIS — Z6831 Body mass index (BMI) 31.0-31.9, adult: Secondary | ICD-10-CM | POA: Diagnosis not present

## 2020-05-02 DIAGNOSIS — M810 Age-related osteoporosis without current pathological fracture: Secondary | ICD-10-CM | POA: Diagnosis not present

## 2020-05-02 DIAGNOSIS — F329 Major depressive disorder, single episode, unspecified: Secondary | ICD-10-CM | POA: Diagnosis not present

## 2020-05-02 DIAGNOSIS — F419 Anxiety disorder, unspecified: Secondary | ICD-10-CM | POA: Diagnosis not present

## 2020-05-02 DIAGNOSIS — B9689 Other specified bacterial agents as the cause of diseases classified elsewhere: Secondary | ICD-10-CM | POA: Diagnosis not present

## 2020-05-02 DIAGNOSIS — Z0000 Encounter for general adult medical examination without abnormal findings: Secondary | ICD-10-CM | POA: Diagnosis not present

## 2020-05-02 DIAGNOSIS — J208 Acute bronchitis due to other specified organisms: Secondary | ICD-10-CM | POA: Diagnosis not present

## 2020-05-02 DIAGNOSIS — G43909 Migraine, unspecified, not intractable, without status migrainosus: Secondary | ICD-10-CM | POA: Diagnosis not present

## 2020-07-06 DIAGNOSIS — B029 Zoster without complications: Secondary | ICD-10-CM | POA: Diagnosis not present

## 2020-09-10 DIAGNOSIS — Z20828 Contact with and (suspected) exposure to other viral communicable diseases: Secondary | ICD-10-CM | POA: Diagnosis not present

## 2020-09-10 DIAGNOSIS — R070 Pain in throat: Secondary | ICD-10-CM | POA: Diagnosis not present

## 2020-09-28 DIAGNOSIS — J019 Acute sinusitis, unspecified: Secondary | ICD-10-CM | POA: Diagnosis not present

## 2020-09-28 DIAGNOSIS — J208 Acute bronchitis due to other specified organisms: Secondary | ICD-10-CM | POA: Diagnosis not present

## 2020-09-28 DIAGNOSIS — B9689 Other specified bacterial agents as the cause of diseases classified elsewhere: Secondary | ICD-10-CM | POA: Diagnosis not present

## 2020-11-15 DIAGNOSIS — R6889 Other general symptoms and signs: Secondary | ICD-10-CM | POA: Diagnosis not present

## 2020-11-27 DIAGNOSIS — K5792 Diverticulitis of intestine, part unspecified, without perforation or abscess without bleeding: Secondary | ICD-10-CM | POA: Diagnosis not present

## 2020-11-27 DIAGNOSIS — N390 Urinary tract infection, site not specified: Secondary | ICD-10-CM | POA: Diagnosis not present

## 2020-11-29 DIAGNOSIS — N200 Calculus of kidney: Secondary | ICD-10-CM | POA: Diagnosis not present

## 2020-11-29 DIAGNOSIS — K760 Fatty (change of) liver, not elsewhere classified: Secondary | ICD-10-CM | POA: Diagnosis not present

## 2020-11-29 DIAGNOSIS — I700 Atherosclerosis of aorta: Secondary | ICD-10-CM | POA: Diagnosis not present

## 2020-11-29 DIAGNOSIS — K5792 Diverticulitis of intestine, part unspecified, without perforation or abscess without bleeding: Secondary | ICD-10-CM | POA: Diagnosis not present

## 2020-11-29 DIAGNOSIS — N1330 Unspecified hydronephrosis: Secondary | ICD-10-CM | POA: Diagnosis not present

## 2020-11-29 DIAGNOSIS — K838 Other specified diseases of biliary tract: Secondary | ICD-10-CM | POA: Diagnosis not present

## 2020-11-30 DIAGNOSIS — N201 Calculus of ureter: Secondary | ICD-10-CM | POA: Diagnosis not present

## 2020-12-04 ENCOUNTER — Other Ambulatory Visit: Payer: Self-pay | Admitting: Urology

## 2020-12-04 ENCOUNTER — Encounter (HOSPITAL_COMMUNITY): Payer: Self-pay | Admitting: Urology

## 2020-12-04 ENCOUNTER — Other Ambulatory Visit: Payer: Self-pay

## 2020-12-04 NOTE — Progress Notes (Signed)
COVID Vaccine Completed:  x1 Date COVID Vaccine completed:  September 2021 COVID vaccine manufacturer: Pfizer     PCP - Foye Deer, MD Cardiologist - N/A  Chest x-ray - N/A EKG - N/A Stress Test -  ECHO -  Cardiac Cath -  Pacemaker/ICD device last checked:  Sleep Study - N/A CPAP -   Fasting Blood Sugar - N/A Checks Blood Sugar _____ times a day  Blood Thinner Instructions:N/A Aspirin Instructions: Last Dose:  Anesthesia review:   Patient denies shortness of breath, fever, cough and chest pain at PAT appointment.  Patient able to climb a flight of stairs and perform ADLs independently   Patient verbalized understanding of instructions that were given to them at the PAT appointment. Patient was also instructed that they will need to review over the PAT instructions again at home before surgery.

## 2020-12-04 NOTE — H&P (Signed)
Office Visit Report     11/30/2020   --------------------------------------------------------------------------------   Hayley Walters  [id number]  DOB: 1946/07/28, 75 year old Female  SSN:    PRIMARY CARE:  Nicoletta Dress, MD  REFERRING:  Nicoletta Dress, MD  PROVIDER:  Festus Aloe, M.D.  LOCATION:  [facility] [phone]     --------------------------------------------------------------------------------   CC/HPI: Hayley Walters was referred for a 6 mm left proximal ureteral stone on CT 11/29/2020. There were no other stones. It was difficult to see on the scout. She has had left-sided abdominal pain since 11/25/2020. She was started on Cipro and Flagyl.   She hasn't seen a stone pass. She has nausea with the pain. No emesis. She is staying hydrated. No tamsulosin. KUB today equivocal with contrast in colon.   No prior stones.     ALLERGIES: None   MEDICATIONS: Alprazolam 0.5 mg tablet  Calcium  Ciprofloxacin Hcl 500 mg tablet  Forteo 20 mcg/dose (600 mcg/2.4 ml) pen injector  Hydrocodone-Acetaminophen 5 mg-325 mg tablet  Maxalt Mlt 10 mg tablet,disintegrating  Metronidazole 500 mg tablet  Oxycodone Hcl 10 mg tablet  Proair Hfa 90 mcg hfa aerosol with adapter  Sertraline Hcl 100 mg tablet  Topiramate 50 mg tablet  Valacyclovir 1,000 mg tablet  Vitamin D3     GU PSH: Hysterectomy     NON-GU PSH: Breast augmentation Carpal tunnel surgery, Bilateral Cataract surgery, Bilateral C-Section Foot surgery (unspecified), Right Hip Arthroscopy/surgery, Bilateral Tonsillectomy     GU PMH: None   NON-GU PMH: Arthritis Fracture of unspecified carpal bone, left wrist, initial encounter for closed fracture    FAMILY HISTORY: Death of family member - Mother, Father   SOCIAL HISTORY: Marital Status: Married Preferred Language: English; Ethnicity: Not Hispanic Or Latino; Race: White Current Smoking Status: Patient does not smoke anymore. Has  not smoked since 11/01/2000. Smoked for 10 years. Smoked 1 pack per day.   Tobacco Use Assessment Completed: Used Tobacco in last 30 days? Does not use smokeless tobacco. Has never drank.  Drinks 3 caffeinated drinks per day.    REVIEW OF SYSTEMS:    GU Review Female:   Patient reports get up at night to urinate. Patient denies frequent urination, hard to postpone urination, burning /pain with urination, leakage of urine, stream starts and stops, trouble starting your stream, have to strain to urinate, and being pregnant.  Gastrointestinal (Upper):   Patient denies indigestion/ heartburn, nausea, and vomiting.  Gastrointestinal (Lower):   Patient denies diarrhea and constipation.  Constitutional:   Patient denies fever, night sweats, weight loss, and fatigue.  Skin:   Patient denies skin rash/ lesion and itching.  Eyes:   Patient denies blurred vision and double vision.  Ears/ Nose/ Throat:   Patient denies sore throat and sinus problems.  Hematologic/Lymphatic:   Patient denies swollen glands and easy bruising.  Cardiovascular:   Patient denies leg swelling and chest pains.  Respiratory:   Patient denies cough and shortness of breath.  Endocrine:   Patient denies excessive thirst.  Musculoskeletal:   Patient reports back pain. Patient denies joint pain.  Neurological:   Patient reports dizziness. Patient denies headaches.  Psychologic:   Patient denies depression and anxiety.   VITAL SIGNS:      11/30/2020 10:04 AM  Weight 162 lb / 73.48 kg  Height 60 in / 152.4 cm  BP 121/70 mmHg  Pulse 87 /min  Temperature 98.0 F / 36.6 C  BMI 31.6 kg/m  MULTI-SYSTEM PHYSICAL EXAMINATION:    Constitutional: Well-nourished. No physical deformities. Normally developed. Good grooming.  Neck: Neck symmetrical, not swollen. Normal tracheal position.  Respiratory: No labored breathing, no use of accessory muscles.   Cardiovascular: Normal temperature, normal extremity pulses, no swelling, no  varicosities.  Skin: No paleness, no jaundice, no cyanosis. No lesion, no ulcer, no rash.  Neurologic / Psychiatric: Oriented to time, oriented to place, oriented to person. No depression, no anxiety, no agitation.  Gastrointestinal: No mass, no tenderness, no rigidity, non obese abdomen.  Eyes: Normal conjunctivae. Normal eyelids.  Ears, Nose, Mouth, and Throat: Left ear no scars, no lesions, no masses. Right ear no scars, no lesions, no masses. Nose no scars, no lesions, no masses. Normal hearing. Normal lips.  Musculoskeletal: Normal gait and station of head and neck.     PAST DATA REVIEW: None   PROCEDURES:         KUB - 74018  A single view of the abdomen is obtained.  Calculi:  none noted - but contrast along length of colon       The bones appeared normal. The bowel gas pattern appeared normal. The soft tissues were unremarkable. Patient confirmed No Neulasta OnPro Device.            Urinalysis w/Scope Dipstick Dipstick Cont'd Micro  Color: Amber Bilirubin: Neg mg/dL WBC/hpf: 0 - 5/hpf  Appearance: Clear Ketones: Neg mg/dL RBC/hpf: 0 - 2/hpf  Specific Gravity: >1.030 Blood: 2+ ery/uL Bacteria: Mod (26-50/hpf)  pH: <=5.0 Protein: 1+ mg/dL Cystals: NS (Not Seen)  Glucose: Neg mg/dL Urobilinogen: 1.0 mg/dL Casts: NS (Not Seen)    Nitrites: Neg Trichomonas: Not Present    Leukocyte Esterase: 1+ leu/uL Mucous: Present      Epithelial Cells: NS (Not Seen)      Yeast: NS (Not Seen)      Sperm: Not Present    Notes: Unspun micro due to quantity          Ketoralac 60mg  - [id number], [id number] Qty: 60 Adm. By: Robynn Pane  Unit: mg Lot No [id number]  Route: IM Exp. Date 11/01/2021  Freq: None Mfgr.:   Site: Right Buttock         Phenergan 25mg  - [id number], [id number] Qty: 25 Adm. By: Robynn Pane  Unit: mg Lot No [id number]  Route: IM Exp. Date 08/02/2021  Freq: None Mfgr.:   Site: Right Buttock   ASSESSMENT:      ICD-10 Details  1 GU:   Ureteral calculus - A999333 Acute, Uncomplicated  - Urine sent for cx as a precaution. She was given toradol 60 mg IM today and promethazine 25 mg IM. Feeling much better. KUB equivocal. I discussed with the patient the nature risks and benefits of continued stone passage, off label use of alpha blockers, shockwave lithotripsy or ureteroscopy. All questions answered. Will go ahead and set up something for next Monday or Tuesday in event stone doesn't pass - cysto, left RGP, left URS, stent - disc staged procedure a possibility or she might pass stone.    PLAN:            Medications New Meds: Tamsulosin Hcl 0.4 mg capsule 1 capsule PO Q HS   #14  0 Refill(s)            Orders Labs Urine Culture  X-Rays: KUB          Schedule Return Visit/Planned Activity: ASAP - Schedule Surgery  Document Letter(s):  Created for Patient: Clinical Summary         Notes:   cc: Dr. Tomasa Blase         Next Appointment:      Next Appointment: 01/17/2021 10:30 AM    Appointment Type: Renal Ultrasound    Location: [facility] [phone]    Provider: Radiology Rm1 Radiology Rm 1    Reason for Visit: PO 6 WEEKS WITH LIMITED LEFT RENAL US PRIOR      * Signed by Jerilee Field, M.D. on 11/30/20 at 4:46 PM (EST)*     The information contained in this medical record document is considered private and confidential patient information. This information can only be used for the medical diagnosis and/or medical services that are being provided by the patient's selected caregivers. This information can only be distributed outside of the patient's care if the patient agrees and signs waivers of authorization for this information to be sent to an outside source or route.

## 2020-12-05 ENCOUNTER — Encounter (HOSPITAL_COMMUNITY): Payer: Self-pay | Admitting: Urology

## 2020-12-05 ENCOUNTER — Encounter (HOSPITAL_COMMUNITY)
Admission: RE | Disposition: A | Discharge status: Home / Self Care | Payer: Self-pay | Source: Home / Self Care | Attending: Urology

## 2020-12-05 ENCOUNTER — Ambulatory Visit (HOSPITAL_COMMUNITY): Payer: PPO

## 2020-12-05 ENCOUNTER — Ambulatory Visit (HOSPITAL_COMMUNITY): Payer: PPO | Admitting: Anesthesiology

## 2020-12-05 ENCOUNTER — Ambulatory Visit (HOSPITAL_COMMUNITY)
Admission: RE | Admit: 2020-12-05 | Discharge: 2020-12-05 | Disposition: A | Discharge status: Home / Self Care | Payer: PPO | Attending: Urology | Admitting: Urology

## 2020-12-05 DIAGNOSIS — Z87891 Personal history of nicotine dependence: Secondary | ICD-10-CM | POA: Diagnosis not present

## 2020-12-05 DIAGNOSIS — M810 Age-related osteoporosis without current pathological fracture: Secondary | ICD-10-CM | POA: Diagnosis not present

## 2020-12-05 DIAGNOSIS — N201 Calculus of ureter: Secondary | ICD-10-CM | POA: Insufficient documentation

## 2020-12-05 DIAGNOSIS — K589 Irritable bowel syndrome without diarrhea: Secondary | ICD-10-CM | POA: Diagnosis not present

## 2020-12-05 DIAGNOSIS — G43909 Migraine, unspecified, not intractable, without status migrainosus: Secondary | ICD-10-CM | POA: Diagnosis not present

## 2020-12-05 HISTORY — PX: CYSTOSCOPY/URETEROSCOPY/HOLMIUM LASER/STENT PLACEMENT: SHX6546

## 2020-12-05 LAB — CBC
HCT: 36.5 % (ref 36.0–46.0)
Hemoglobin: 11.7 g/dL — ABNORMAL LOW (ref 12.0–15.0)
MCH: 31.5 pg (ref 26.0–34.0)
MCHC: 32.1 g/dL (ref 30.0–36.0)
MCV: 98.1 fL (ref 80.0–100.0)
Platelets: 221 10*3/uL (ref 150–400)
RBC: 3.72 MIL/uL — ABNORMAL LOW (ref 3.87–5.11)
RDW: 12.3 % (ref 11.5–15.5)
WBC: 5.4 10*3/uL (ref 4.0–10.5)
nRBC: 0 % (ref 0.0–0.2)

## 2020-12-05 LAB — BASIC METABOLIC PANEL
Anion gap: 8 (ref 5–15)
BUN: 15 mg/dL (ref 8–23)
CO2: 25 mmol/L (ref 22–32)
Calcium: 8.9 mg/dL (ref 8.9–10.3)
Chloride: 106 mmol/L (ref 98–111)
Creatinine, Ser: 1.04 mg/dL — ABNORMAL HIGH (ref 0.44–1.00)
GFR, Estimated: 57 mL/min — ABNORMAL LOW (ref >60)
Glucose, Bld: 110 mg/dL — ABNORMAL HIGH (ref 70–99)
Potassium: 3.9 mmol/L (ref 3.5–5.1)
Sodium: 139 mmol/L (ref 135–145)

## 2020-12-05 SURGERY — CYSTOSCOPY/URETEROSCOPY/HOLMIUM LASER/STENT PLACEMENT
Anesthesia: General | Laterality: Left

## 2020-12-05 MED ORDER — IOHEXOL 300 MG/ML  SOLN
INTRAMUSCULAR | Status: DC | PRN
  Administered 2020-12-05: 7 mL

## 2020-12-05 MED ORDER — FENTANYL CITRATE (PF) 250 MCG/5ML IJ SOLN
INTRAMUSCULAR | Status: AC
  Filled 2020-12-05: qty 5

## 2020-12-05 MED ORDER — PROPOFOL 10 MG/ML IV BOLUS
INTRAVENOUS | Status: AC
  Filled 2020-12-05: qty 20

## 2020-12-05 MED ORDER — MIDAZOLAM HCL 5 MG/5ML IJ SOLN
INTRAMUSCULAR | Status: DC | PRN
  Administered 2020-12-05: 2 mg via INTRAVENOUS

## 2020-12-05 MED ORDER — ONDANSETRON HCL 4 MG/2ML IJ SOLN
INTRAMUSCULAR | Status: DC | PRN
  Administered 2020-12-05: 4 mg via INTRAVENOUS

## 2020-12-05 MED ORDER — PROPOFOL 10 MG/ML IV BOLUS
INTRAVENOUS | Status: DC | PRN
  Administered 2020-12-05: 160 mg via INTRAVENOUS

## 2020-12-05 MED ORDER — ORAL CARE MOUTH RINSE
15.0000 mL | Freq: Once | OROMUCOSAL | Status: AC
  Administered 2020-12-05: 15 mL via OROMUCOSAL

## 2020-12-05 MED ORDER — FENTANYL CITRATE (PF) 100 MCG/2ML IJ SOLN
50.0000 ug | INTRAMUSCULAR | Status: DC | PRN
Start: 2020-12-05 — End: 2020-12-05
  Administered 2020-12-05: 50 ug via INTRAVENOUS
  Filled 2020-12-05: qty 2

## 2020-12-05 MED ORDER — FENTANYL CITRATE (PF) 100 MCG/2ML IJ SOLN: 25.0000 ug | INTRAMUSCULAR | Status: DC | PRN

## 2020-12-05 MED ORDER — MIDAZOLAM HCL 2 MG/2ML IJ SOLN
INTRAMUSCULAR | Status: AC
  Filled 2020-12-05: qty 2

## 2020-12-05 MED ORDER — LIDOCAINE HCL (CARDIAC) PF 50 MG/5ML IV SOSY
PREFILLED_SYRINGE | INTRAVENOUS | Status: DC | PRN
  Administered 2020-12-05: 80 mg via INTRAVENOUS

## 2020-12-05 MED ORDER — DEXAMETHASONE SODIUM PHOSPHATE 10 MG/ML IJ SOLN
INTRAMUSCULAR | Status: DC | PRN
  Administered 2020-12-05: 6 mg via INTRAVENOUS

## 2020-12-05 MED ORDER — ONDANSETRON HCL 4 MG/2ML IJ SOLN
4.0000 mg | Freq: Once | INTRAMUSCULAR | Status: AC
  Administered 2020-12-05: 4 mg via INTRAVENOUS
  Filled 2020-12-05: qty 2

## 2020-12-05 MED ORDER — CHLORHEXIDINE GLUCONATE 0.12 % MT SOLN: 15.0000 mL | Freq: Once | OROMUCOSAL | Status: AC

## 2020-12-05 MED ORDER — LACTATED RINGERS IV SOLN: INTRAVENOUS | Status: DC

## 2020-12-05 MED ORDER — ONDANSETRON HCL 4 MG/2ML IJ SOLN: 4.0000 mg | Freq: Once | INTRAMUSCULAR | Status: DC | PRN

## 2020-12-05 MED ORDER — SODIUM CHLORIDE 0.9 % IR SOLN
Status: DC | PRN
  Administered 2020-12-05: 6000 mL

## 2020-12-05 MED ORDER — KETOROLAC TROMETHAMINE 15 MG/ML IJ SOLN: 15.0000 mg | Freq: Once | INTRAMUSCULAR | Status: DC | PRN

## 2020-12-05 MED ORDER — ACETAMINOPHEN 10 MG/ML IV SOLN
1000.0000 mg | Freq: Once | INTRAVENOUS | Status: DC | PRN
Start: 2020-12-05 — End: 2020-12-05

## 2020-12-05 MED ORDER — ONDANSETRON HCL 4 MG/2ML IJ SOLN
INTRAMUSCULAR | Status: AC
  Filled 2020-12-05: qty 2

## 2020-12-05 MED ORDER — FENTANYL CITRATE (PF) 100 MCG/2ML IJ SOLN
INTRAMUSCULAR | Status: DC | PRN
  Administered 2020-12-05 (×2): 50 ug via INTRAVENOUS

## 2020-12-05 MED ORDER — DEXAMETHASONE SODIUM PHOSPHATE 10 MG/ML IJ SOLN
INTRAMUSCULAR | Status: AC
  Filled 2020-12-05: qty 1

## 2020-12-05 MED ORDER — CEFAZOLIN SODIUM-DEXTROSE 2-4 GM/100ML-% IV SOLN
2.0000 g | Freq: Once | INTRAVENOUS | Status: AC
  Administered 2020-12-05: 2 g via INTRAVENOUS
  Filled 2020-12-05: qty 100

## 2020-12-05 MED ORDER — NITROFURANTOIN MACROCRYSTAL 50 MG PO CAPS: 50.0000 mg | ORAL_CAPSULE | Freq: Every day | ORAL | 0 refills | Status: DC

## 2020-12-05 SURGICAL SUPPLY — 23 items
BAG URO CATCHER STRL LF (MISCELLANEOUS) ×2 IMPLANT
BASKET ZERO TIP NITINOL 2.4FR (BASKET) IMPLANT
BSKT STON RTRVL ZERO TP 2.4FR (BASKET)
CATH INTERMIT  6FR 70CM (CATHETERS) ×2 IMPLANT
CATH URET 5FR 28IN CONE TIP (BALLOONS)
CATH URET 5FR 28IN OPEN ENDED (CATHETERS) ×1 IMPLANT
CATH URET 5FR 70CM CONE TIP (BALLOONS) IMPLANT
CLOTH BEACON ORANGE TIMEOUT ST (SAFETY) ×2 IMPLANT
GLOVE BIO SURGEON STRL SZ7.5 (GLOVE) ×2 IMPLANT
GOWN STRL REUS W/TWL XL LVL3 (GOWN DISPOSABLE) ×2 IMPLANT
GUIDEWIRE ANG ZIPWIRE 038X150 (WIRE) ×1 IMPLANT
GUIDEWIRE STR DUAL SENSOR (WIRE) ×2 IMPLANT
KIT TURNOVER KIT A (KITS) IMPLANT
LASER FIB FLEXIVA PULSE ID 365 (Laser) IMPLANT
MANIFOLD NEPTUNE II (INSTRUMENTS) ×2 IMPLANT
PACK CYSTO (CUSTOM PROCEDURE TRAY) ×2 IMPLANT
SHEATH URETERAL 12FRX28CM (UROLOGICAL SUPPLIES) IMPLANT
SHEATH URETERAL 12FRX35CM (MISCELLANEOUS) IMPLANT
STENT URET 6FRX24 CONTOUR (STENTS) ×1 IMPLANT
TRACTIP FLEXIVA PULS ID 200XHI (Laser) IMPLANT
TRACTIP FLEXIVA PULSE ID 200 (Laser) ×2
TUBING CONNECTING 10 (TUBING) ×2 IMPLANT
TUBING UROLOGY SET (TUBING) ×2 IMPLANT

## 2020-12-05 NOTE — Anesthesia Preprocedure Evaluation (Addendum)
Anesthesia Evaluation  Patient identified by MRN, date of birth, ID band Patient awake    Reviewed: Allergy & Precautions, NPO status , Patient's Chart, lab work & pertinent test results  Airway Mallampati: III  TM Distance: >3 FB Neck ROM: Full    Dental no notable dental hx.    Pulmonary former smoker,    Pulmonary exam normal breath sounds clear to auscultation       Cardiovascular negative cardio ROS Normal cardiovascular exam Rhythm:Regular Rate:Normal     Neuro/Psych  Headaches, PSYCHIATRIC DISORDERS Depression    GI/Hepatic Neg liver ROS, IBS (irritable bowel syndrome)   Endo/Other  negative endocrine ROS  Renal/GU negative Renal ROS     Musculoskeletal  (+) Arthritis ,   Abdominal (+) + obese,   Peds  Hematology negative hematology ROS (+)   Anesthesia Other Findings  LEFT URETERAL STONE  Reproductive/Obstetrics                            Anesthesia Physical Anesthesia Plan  ASA: II  Anesthesia Plan: General   Post-op Pain Management:    Induction: Intravenous  PONV Risk Score and Plan: 3 and Ondansetron, Dexamethasone, Midazolam and Treatment may vary due to age or medical condition  Airway Management Planned: LMA  Additional Equipment:   Intra-op Plan:   Post-operative Plan: Extubation in OR  Informed Consent: I have reviewed the patients History and Physical, chart, labs and discussed the procedure including the risks, benefits and alternatives for the proposed anesthesia with the patient or authorized representative who has indicated his/her understanding and acceptance.     Dental advisory given  Plan Discussed with: CRNA  Anesthesia Plan Comments:         Anesthesia Quick Evaluation

## 2020-12-05 NOTE — Anesthesia Procedure Notes (Signed)
Procedure Name: LMA Insertion Date/Time: 12/05/2020 1:39 PM Performed by: Illene Silver, CRNA Pre-anesthesia Checklist: Patient identified, Emergency Drugs available, Suction available and Patient being monitored Patient Re-evaluated:Patient Re-evaluated prior to induction Oxygen Delivery Method: Circle system utilized Preoxygenation: Pre-oxygenation with 100% oxygen Induction Type: IV induction Ventilation: Mask ventilation without difficulty Tube type: Oral Number of attempts: 1 Airway Equipment and Method: Oral airway Placement Confirmation: positive ETCO2 Tube secured with: Tape Dental Injury: Teeth and Oropharynx as per pre-operative assessment

## 2020-12-05 NOTE — Transfer of Care (Signed)
Immediate Anesthesia Transfer of Care Note  Patient: Hayley Walters  Procedure(s) Performed: CYSTOSCOPY/LEFT RETROGRADE PLYOGRAM/LEFT DIAGNOSTIC URETEROSCOPY AND STENT PLACEMENT (Left )  Patient Location: PACU  Anesthesia Type:General  Level of Consciousness: awake, alert , oriented and patient cooperative  Airway & Oxygen Therapy: Patient Spontanous Breathing and Patient connected to face mask oxygen  Post-op Assessment: Report given to RN, Post -op Vital signs reviewed and stable and Patient moving all extremities X 4  Post vital signs: stable  Last Vitals:  Vitals Value Taken Time  BP 126/58 12/05/20 1446  Temp    Pulse 84 12/05/20 1450  Resp 11 12/05/20 1450  SpO2 100 % 12/05/20 1450  Vitals shown include unvalidated device data.  Last Pain:  Vitals:   12/05/20 1246  TempSrc:   PainSc: 3          Complications: No complications documented.

## 2020-12-05 NOTE — Interval H&P Note (Signed)
History and Physical Interval Note:  12/05/2020 1:30 PM  Hayley Walters  has presented today for surgery, with the diagnosis of LEFT URETERAL STONE.  The various methods of treatment have been discussed with the patient and family. After consideration of risks, benefits and other options for treatment, the patient has consented to  Procedure(s) with comments: CYSTOSCOPY/RETRORADE/URETEROSCOPY/HOLMIUM LASER/STENT PLACEMENT (Left) - ONLY NEEDS 60 MIN as a surgical intervention.  She has not seen a stone pass. She continues to have left flank pain and is having severe pain now (looks uncomfortable, clutching her side). Denies fever, chills, coughs or cold symptoms. The patient's history has been reviewed, patient examined, no change in status, stable for surgery.  I have reviewed the patient's chart and labs.  Questions were answered to the patient's satisfaction.     Jerilee Field

## 2020-12-05 NOTE — Op Note (Signed)
Preoperative diagnosis: Left ureteral stone Postoperative diagnosis: Left ureteral stone  Procedure: Cystoscopy with left retrograde pyelogram, left ureteroscopy, left ureteral stent placement  Surgeon: Mena Goes  Anesthesia: General  Indication for procedure: Hayley Walters is a 75 year old female who has a left proximal ureteral stone.  She has not passed the stone and continued to be symptomatic from it.  It was not clearly visible on KUB.  In the office and in the preop area we discussed the possibility of needing a staged procedure.  Findings: On cystoscopy the bladder was unremarkable.  Ureteral orifice ease were in their normal orthotopic position.  Left retrograde pyelogram-this outlined a single ureter single collecting system unit with a filling defect in the proximal ureter consistent with the stone and mild to moderate proximal hydroureteronephrosis  Left ureteroscopy-the distal and mid ureter was quite tight and just allowed a single channel 4.5 French semirigid ureteroscope but would not allow ureteral access sheath or even a single channel flexible digital to pass proximally.  I was able to see the stone up in the proximal ureter near the UPJ a few centimeters out of the reach of the scope.  It moved proximally with wire and scope placement.  Description of procedure: After consent was obtained patient brought to the operating room.  After adequate anesthesia she is placed in lithotomy position and prepped and draped in the usual sterile fashion.  A timeout was performed to confirm the patient and procedure.  Cystoscope was passed per urethra and the bladder carefully inspected.  I then tried to cannulate left ureteral orifice with a 6 [place] and then a 5 [place] open-ended catheter but neither would cannulate.  Therefore I passed a sensor wire into the distal ureter and over this was able to get the 5 [place] open-ended catheter into the distal ureter.  The wire was removed and retrograde  injection of contrast was performed.  The wire was then repassed and coiled in the collecting system.  The 5 [place] open-ended catheter and cystoscope were then backed out.  I then took a short single channel semirigid ureteroscope and could not get this into the distal ureter.  The ureterovesical junction was quite tight.  Therefore this was backed out and the inner cannula of an access sheath was passed and this only just went into the distal ureter but not up to the mid or proximal.  I then repassed the semirigid scope was now able to work wire into the distal ureter.  There was a small stone there may be 3 mm and I was not sure if this was the actual stone.  I passed a 242 m laser fiber thinking we could dust this piece and then the stone approximately but this be simply washed away proximally.  I was able to slowly work the semirigid up to where the stone should be in the proximal to mid ureteral location but no stone was found and I reached the limit of the scope.  Therefore the scope was backed out and exchanged for a long single channel semirigid scope.  I was able to get this into the proximal ureter but no stone was located.  I was able to find where the stone had been impacted in the transition point to the dilated proximal ureter but again no stone.  The scope was then they are quite tight so I passed a Glidewire and backed the scope out while the assistant held the sensor wire in place to secure the system.  I then  passed the access sheath but it still would not pass into the proximal part of the mid ureter or the proximal ureter.  I passed the access sheath as far as I could and then passed a single channel digital but could not get the single channel digital up in the mid ureter where it had been quite tight with the semirigid.  Also the access sheath was not in far enough and it slid out onto the scope.  I tried to pass the single channel digital over one of the wires but it would not pass and  coiled in the bladder.  Therefore I repeated the process took the long single channel semirigid out and thought I would pass this 1 more time because it seemed to loosen up with the passage of each piece of equipment.  This time I was able to get more proximal into the proximal ureter and now I could see the stone up near the UPJ.  It was a few centimeters out of reach.  I was glad to confirm the larger 7 mm stones existence as I was wondering if it had broken up.  I did not want to basket it and risk getting a basket stuck proximally out of the reach of the scope.  Therefore I passed a wire again through the semirigid and with the assistant holding the safety wire backed the semirigid out.  It was tight but since I was able to get more proximal I tried 1 more time to pass the access sheath but this would not get proximal access.  I then tried to pass the single channel flexible ureteroscope 1 more time over one of the wires but again could not get up through the mid ureter.  Therefore I thought it best to leave a stent and let it hopefully passively dilate the ureter so we can get back up the next time.  The second wire was removed and the zip wire backloaded on the cystoscope and a 624 cm stent advanced.  The wire was removed a good coil seen in the collecting system and a good coil in the bladder.  The bladder was drained and the scope removed.  She was awakened taken recovery room in stable condition.  Complications: None  Blood loss: Minimal  Specimens: None  Drains: 6 x 24 cm left ureteral stent  Disposition: Patient stable to PACU--I updated her husband on the procedure, stent placement and importance of follow-up procedure.

## 2020-12-05 NOTE — Discharge Instructions (Signed)
Ureteral Stent Implantation, Care After This sheet gives you information about how to care for yourself after your procedure. Your health care provider may also give you more specific instructions. If you have problems or questions, contact your health care provider.  Be sure to follow-up with Dr. Junious Silk for stent/stone removal - office will call to schedule next procedure.   What can I expect after the procedure? After the procedure, it is common to have:  Nausea.  Mild pain when you urinate. You may feel this pain in your lower back or lower abdomen. The pain should stop within a few minutes after you urinate. This may last for up to 1 week.  A small amount of blood in your urine for several days. Follow these instructions at home: Medicines  Take over-the-counter and prescription medicines only as told by your health care provider.  If you were prescribed an antibiotic medicine, take it as told by your health care provider. Do not stop taking the antibiotic even if you start to feel better.  Do not drive for 24 hours if you were given a sedative during your procedure.  Ask your health care provider if the medicine prescribed to you requires you to avoid driving or using heavy machinery. Activity  Rest as told by your health care provider.  Avoid sitting for a long time without moving. Get up to take short walks every 1-2 hours. This is important to improve blood flow and breathing. Ask for help if you feel weak or unsteady.  Return to your normal activities as told by your health care provider. Ask your health care provider what activities are safe for you. General instructions   Watch for any blood in your urine. Call your health care provider if the amount of blood in your urine increases.  If you have a catheter: ? Follow instructions from your health care provider about taking care of your catheter and collection bag. ? Do not take baths, swim, or use a hot tub until  your health care provider approves. Ask your health care provider if you may take showers. You may only be allowed to take sponge baths.  Drink enough fluid to keep your urine pale yellow.  Do not use any products that contain nicotine or tobacco, such as cigarettes, e-cigarettes, and chewing tobacco. These can delay healing after surgery. If you need help quitting, ask your health care provider.  Keep all follow-up visits as told by your health care provider. This is important. Contact a health care provider if:  You have pain that gets worse or does not get better with medicine, especially pain when you urinate.  You have difficulty urinating.  You feel nauseous or you vomit repeatedly during a period of more than 2 days after the procedure. Get help right away if:  Your urine is dark red or has blood clots in it.  You are leaking urine (have incontinence).  The end of the stent comes out of your urethra.  You cannot urinate.  You have sudden, sharp, or severe pain in your abdomen or lower back.  You have a fever.  You have swelling or pain in your legs.  You have difficulty breathing. Summary  After the procedure, it is common to have mild pain when you urinate that goes away within a few minutes after you urinate. This may last for up to 1 week.  Watch for any blood in your urine. Call your health care provider if the amount of  blood in your urine increases.  Take over-the-counter and prescription medicines only as told by your health care provider.  Drink enough fluid to keep your urine pale yellow. This information is not intended to replace advice given to you by your health care provider. Make sure you discuss any questions you have with your health care provider. Document Revised: 08/25/2018 Document Reviewed: 08/26/2018 Elsevier Patient Education  2020 [employer].

## 2020-12-06 ENCOUNTER — Encounter (HOSPITAL_COMMUNITY): Payer: Self-pay | Admitting: Urology

## 2020-12-06 NOTE — Anesthesia Postprocedure Evaluation (Signed)
Anesthesia Post Note  Patient: Hayley Walters  Procedure(s) Performed: CYSTOSCOPY/LEFT RETROGRADE PLYOGRAM/LEFT DIAGNOSTIC URETEROSCOPY AND STENT PLACEMENT (Left )     Patient location during evaluation: PACU Anesthesia Type: General Level of consciousness: awake and alert and oriented Pain management: pain level controlled Vital Signs Assessment: post-procedure vital signs reviewed and stable Respiratory status: spontaneous breathing, nonlabored ventilation and respiratory function stable Cardiovascular status: blood pressure returned to baseline Postop Assessment: no apparent nausea or vomiting Anesthetic complications: no   No complications documented.  Last Vitals:  Vitals:   12/05/20 1530 12/05/20 1648  BP: 134/72 (!) 148/73  Pulse: 80 85  Resp: 12 16  Temp: 36.9 C 36.6 C  SpO2: 91% 93%    Last Pain:  Vitals:   12/05/20 1648  TempSrc: Oral  PainSc: 0-No pain                 Kaylyn Layer

## 2020-12-13 ENCOUNTER — Encounter (HOSPITAL_BASED_OUTPATIENT_CLINIC_OR_DEPARTMENT_OTHER): Payer: Self-pay | Admitting: Radiology

## 2020-12-13 ENCOUNTER — Emergency Department (HOSPITAL_BASED_OUTPATIENT_CLINIC_OR_DEPARTMENT_OTHER): Payer: PPO

## 2020-12-13 ENCOUNTER — Emergency Department (HOSPITAL_BASED_OUTPATIENT_CLINIC_OR_DEPARTMENT_OTHER)
Admission: EM | Admit: 2020-12-13 | Discharge: 2020-12-13 | Disposition: A | Discharge status: Home / Self Care | Payer: PPO | Attending: Emergency Medicine | Admitting: Emergency Medicine

## 2020-12-13 ENCOUNTER — Other Ambulatory Visit (HOSPITAL_BASED_OUTPATIENT_CLINIC_OR_DEPARTMENT_OTHER): Payer: Self-pay | Admitting: Emergency Medicine

## 2020-12-13 ENCOUNTER — Other Ambulatory Visit: Payer: Self-pay

## 2020-12-13 DIAGNOSIS — I82411 Acute embolism and thrombosis of right femoral vein: Secondary | ICD-10-CM | POA: Diagnosis not present

## 2020-12-13 DIAGNOSIS — Z8616 Personal history of COVID-19: Secondary | ICD-10-CM | POA: Insufficient documentation

## 2020-12-13 DIAGNOSIS — Z87891 Personal history of nicotine dependence: Secondary | ICD-10-CM | POA: Diagnosis not present

## 2020-12-13 DIAGNOSIS — R1030 Lower abdominal pain, unspecified: Secondary | ICD-10-CM | POA: Insufficient documentation

## 2020-12-13 DIAGNOSIS — Z86711 Personal history of pulmonary embolism: Secondary | ICD-10-CM | POA: Diagnosis not present

## 2020-12-13 DIAGNOSIS — Z466 Encounter for fitting and adjustment of urinary device: Secondary | ICD-10-CM | POA: Diagnosis not present

## 2020-12-13 DIAGNOSIS — R0602 Shortness of breath: Secondary | ICD-10-CM | POA: Diagnosis not present

## 2020-12-13 DIAGNOSIS — R8271 Bacteriuria: Secondary | ICD-10-CM | POA: Diagnosis not present

## 2020-12-13 DIAGNOSIS — M79661 Pain in right lower leg: Secondary | ICD-10-CM | POA: Insufficient documentation

## 2020-12-13 DIAGNOSIS — I82431 Acute embolism and thrombosis of right popliteal vein: Secondary | ICD-10-CM | POA: Diagnosis not present

## 2020-12-13 DIAGNOSIS — N200 Calculus of kidney: Secondary | ICD-10-CM | POA: Insufficient documentation

## 2020-12-13 DIAGNOSIS — J9811 Atelectasis: Secondary | ICD-10-CM | POA: Diagnosis not present

## 2020-12-13 DIAGNOSIS — I2694 Multiple subsegmental pulmonary emboli without acute cor pulmonale: Secondary | ICD-10-CM | POA: Diagnosis not present

## 2020-12-13 DIAGNOSIS — Z7901 Long term (current) use of anticoagulants: Secondary | ICD-10-CM | POA: Diagnosis not present

## 2020-12-13 DIAGNOSIS — I2699 Other pulmonary embolism without acute cor pulmonale: Secondary | ICD-10-CM | POA: Diagnosis not present

## 2020-12-13 DIAGNOSIS — N201 Calculus of ureter: Secondary | ICD-10-CM | POA: Diagnosis not present

## 2020-12-13 DIAGNOSIS — Z85828 Personal history of other malignant neoplasm of skin: Secondary | ICD-10-CM | POA: Diagnosis not present

## 2020-12-13 LAB — CBC WITH DIFFERENTIAL/PLATELET
Abs Immature Granulocytes: 0.03 10*3/uL (ref 0.00–0.07)
Basophils Absolute: 0.1 10*3/uL (ref 0.0–0.1)
Basophils Relative: 1 %
Eosinophils Absolute: 0.1 10*3/uL (ref 0.0–0.5)
Eosinophils Relative: 2 %
HCT: 35.1 % — ABNORMAL LOW (ref 36.0–46.0)
Hemoglobin: 11.5 g/dL — ABNORMAL LOW (ref 12.0–15.0)
Immature Granulocytes: 1 %
Lymphocytes Relative: 23 %
Lymphs Abs: 1.3 10*3/uL (ref 0.7–4.0)
MCH: 31.1 pg (ref 26.0–34.0)
MCHC: 32.8 g/dL (ref 30.0–36.0)
MCV: 94.9 fL (ref 80.0–100.0)
Monocytes Absolute: 0.7 10*3/uL (ref 0.1–1.0)
Monocytes Relative: 12 %
Neutro Abs: 3.4 10*3/uL (ref 1.7–7.7)
Neutrophils Relative %: 61 %
Platelets: 216 10*3/uL (ref 150–400)
RBC: 3.7 MIL/uL — ABNORMAL LOW (ref 3.87–5.11)
RDW: 12.8 % (ref 11.5–15.5)
WBC: 5.5 10*3/uL (ref 4.0–10.5)
nRBC: 0 % (ref 0.0–0.2)

## 2020-12-13 LAB — BASIC METABOLIC PANEL
Anion gap: 11 (ref 5–15)
BUN: 12 mg/dL (ref 8–23)
CO2: 22 mmol/L (ref 22–32)
Calcium: 9.1 mg/dL (ref 8.9–10.3)
Chloride: 107 mmol/L (ref 98–111)
Creatinine, Ser: 0.83 mg/dL (ref 0.44–1.00)
GFR, Estimated: >60 mL/min (ref >60)
Glucose, Bld: 94 mg/dL (ref 70–99)
Potassium: 3.7 mmol/L (ref 3.5–5.1)
Sodium: 140 mmol/L (ref 135–145)

## 2020-12-13 LAB — BRAIN NATRIURETIC PEPTIDE: B Natriuretic Peptide: 43.7 pg/mL (ref 0.0–100.0)

## 2020-12-13 LAB — TROPONIN I (HIGH SENSITIVITY): Troponin I (High Sensitivity): 5 ng/L (ref <18)

## 2020-12-13 LAB — D-DIMER, QUANTITATIVE: D-Dimer, Quant: 5.28 ug/mL-FEU — ABNORMAL HIGH (ref 0.00–0.50)

## 2020-12-13 MED ORDER — APIXABAN (ELIQUIS) VTE STARTER PACK (10MG AND 5MG): ORAL_TABLET | ORAL | 0 refills | Status: DC

## 2020-12-13 MED ORDER — IOHEXOL 350 MG/ML SOLN
100.0000 mL | Freq: Once | INTRAVENOUS | Status: AC | PRN
  Administered 2020-12-13: 100 mL via INTRAVENOUS

## 2020-12-13 MED ORDER — APIXABAN 2.5 MG PO TABS
10.0000 mg | ORAL_TABLET | Freq: Two times a day (BID) | ORAL | Status: DC
  Administered 2020-12-13: 10 mg via ORAL
  Filled 2020-12-13: qty 4

## 2020-12-13 MED FILL — ELIQUIS STARTER PACK 5 MG T: 5 | 30 days supply | Qty: 74 | Fill #0

## 2020-12-13 NOTE — ED Provider Notes (Signed)
[facility] Provider Note   CSN: [id number] Arrival date & time: 12/13/20  1045     History Chief Complaint  Patient presents with  . Respiratory Distress    Sob for past week  . Weakness    Weakness since 1/4 after having kidney stent placed on left side    Hayley Walters is a 75 y.o. female presenting to emergency department with fatigue and shortness of breath.  The patient reports that she tested positive for COVID in the middle of December, approximately 1 month ago.  She had some mild URI type symptoms at the time of the seem to resolve.  Subsequently she developed lower abdominal pain and had a work-up done, was notable for a large obstructing kidney stone.  She had unsuccessful procedure for retrieval with urology, and had a stent placed.  Yesterday she had a follow-up appointment with urology, they are planning for repeat stone removal procedure.  She reports that she has been on 2 antibiotics, but cannot recall what they are with her being prescribed for her.  Today she was referred for a CT scan by her primary care provider with concern for possible blood clots.  The patient reports that she was having some "tenseness" in her right calf for several days last week, which is now resolved.  She said it felt like she had "just been working out without 1 leg".  She denies any swelling, erythema of the leg.  She denies any prior history of blood clots.  She does feel that for the past several days she has had excessive fatigue and dyspnea on exertion.  She says she has extremely low energy and feels like she cannot even lift up plates or do basic activities in her house.  She feels like she is short of breath.  She reports a distant hx of basal cell carcinoma s/p biopsy and resection.  No hx of chronic lung disease or CHF.  HPI     Past Medical History:  Diagnosis Date  . Acute carpal tunnel syndrome of right wrist 07/21/2015  . Anemia   . Arthritis    . Cancer (HCC)    Basal cell   . Colon polyps   . Depression    situational  . Fracture of radius, distal, right, closed 07/21/2015  . Headache    migraines  . IBS (irritable bowel syndrome)   . Non-alcoholic fatty liver disease    mild  . Osteoporosis   . Pneumonia    as a child    Patient Active Problem List   Diagnosis Date Noted  . Iliotibial band syndrome of left side 11/23/2015  . Iliotibial band syndrome of right side 11/23/2015  . Lumbar radiculopathy 11/23/2015  . Acute carpal tunnel syndrome of right wrist 07/21/2015  . Fracture of radius, distal, right, closed 07/21/2015  . Hip joint pain 08/12/2013    Past Surgical History:  Procedure Laterality Date  . ABDOMINAL HYSTERECTOMY    . BREAST SURGERY     augmentation  . CARPAL TUNNEL RELEASE Right 07/21/2015   Procedure: RIGHT CARPAL TUNNEL RELEASE ;  Surgeon: Marchia Bond, MD;  Location: [facility];  Service: Orthopedics;  Laterality: Right;  . COLONOSCOPY    . CYSTOSCOPY/URETEROSCOPY/HOLMIUM LASER/STENT PLACEMENT Left 12/05/2020   Procedure: CYSTOSCOPY/LEFT RETROGRADE PLYOGRAM/LEFT DIAGNOSTIC URETEROSCOPY AND STENT PLACEMENT;  Surgeon: Festus Aloe, MD;  Location: [facility];  Service: Urology;  Laterality: Left;  ONLY NEEDS 60 MIN  . FRACTURE SURGERY  Left    hip/femur  . FRACTURE SURGERY Right    screws in right hip for stress fracture  . OPEN REDUCTION INTERNAL FIXATION (ORIF) DISTAL RADIAL FRACTURE Right 07/21/2015   Procedure: OPEN REDUCTION INTERNAL FIXATION (ORIF) RIGHT DISTAL RADIAL FRACTURE;  Surgeon: Marchia Bond, MD;  Location: [facility];  Service: Orthopedics;  Laterality: Right;  . TONSILLECTOMY       OB History   No obstetric history on file.     Family History  Problem Relation Age of Onset  . Congestive Heart Failure Mother   . Glaucoma Mother   . Macular degeneration Mother   . CVA Father     Social History   Tobacco Use  . Smoking status: Former  Smoker    Quit date: 07/19/2005    Years since quitting: 15.4  . Smokeless tobacco: Never Used  Vaping Use  . Vaping Use: Never used  Substance Use Topics  . Alcohol use: Not Currently  . Drug use: No    Home Medications Prior to Admission medications   Medication Sig Start Date End Date Taking? Authorizing Provider  APIXABAN (ELIQUIS) VTE STARTER PACK (10MG  AND 5MG ) Take as directed on package: start with two-5mg  tablets twice daily for 7 days. On day 8, switch to one-5mg  tablet twice daily. 12/13/20  Yes Wyvonnia Dusky, MD  albuterol (VENTOLIN HFA) 108 (90 Base) MCG/ACT inhaler Inhale 2 puffs into the lungs 4 (four) times daily as needed for wheezing. 06/27/20   [provider]  ALPRAZolam Duanne Moron) 0.5 MG tablet Take 0.5 mg by mouth 3 (three) times daily as needed for anxiety.    [provider]  HYDROcodone-acetaminophen (NORCO/VICODIN) 5-325 MG tablet Take 1 tablet by mouth every 6 (six) hours as needed for moderate pain (Take 1 tablet by bouth every 6 hours as needed for pain). 12/08/17   Landis Martins, DPM  metroNIDAZOLE (FLAGYL) 500 MG tablet Take 500 mg by mouth 2 (two) times daily. 11/27/20   [provider]  nitrofurantoin (MACRODANTIN) 50 MG capsule Take 1 capsule (50 mg total) by mouth at bedtime. 12/05/20   Festus Aloe, MD  Oxycodone HCl 10 MG TABS Take 10 mg by mouth 4 (four) times daily as needed for pain. 11/29/20   [provider]  rizatriptan (MAXALT-MLT) 10 MG disintegrating tablet Take 10 mg by mouth See admin instructions. Take 1 tablet (10 mg) by mouth as needed for migraines, may repeat in 2 hours if needed 04/26/15   [provider]  sertraline (ZOLOFT) 100 MG tablet Take 50 mg by mouth daily. 05/26/15   [provider]  tamsulosin (FLOMAX) 0.4 MG CAPS capsule Take 0.4 mg by mouth at bedtime. 11/30/20   [provider]  topiramate (TOPAMAX) 50 MG tablet Take 50 mg by mouth 2 (two) times daily.    [provider]    Allergies    Patient has no known allergies.  Review of Systems   Review of Systems  Constitutional: Positive for appetite change and fatigue. Negative for chills and fever.  HENT: Negative for ear pain and sore throat.   Eyes: Negative for pain and visual disturbance.  Respiratory: Positive for cough and shortness of breath.   Cardiovascular: Negative for chest pain and palpitations.  Gastrointestinal: Positive for nausea. Negative for abdominal pain and vomiting.  Genitourinary: Negative for difficulty urinating, dysuria and hematuria.  Musculoskeletal: Positive for arthralgias and myalgias.  Skin: Negative for color change and rash.  Neurological: Negative for  syncope and numbness.  All other systems reviewed and are negative.   Physical Exam Updated Vital Signs BP (!) 163/68 (BP Location: Right Arm)   Pulse 82   Temp 97.7 F (36.5 C) (Oral)   Resp 18   Ht 5' (1.524 m)   Wt 75.9 kg   SpO2 100%   BMI 32.69 kg/m   Physical Exam Constitutional:      General: She is not in acute distress. HENT:     Head: Normocephalic and atraumatic.  Eyes:     Conjunctiva/sclera: Conjunctivae normal.     Pupils: Pupils are equal, round, and reactive to light.  Cardiovascular:     Rate and Rhythm: Normal rate and regular rhythm.     Pulses: Normal pulses.  Pulmonary:     Effort: Pulmonary effort is normal. No respiratory distress.  Abdominal:     General: There is no distension.     Tenderness: There is no abdominal tenderness.  Musculoskeletal:        General: No swelling, tenderness or deformity.  Skin:    General: Skin is warm and dry.  Neurological:     General: No focal deficit present.     Mental Status: She is alert. Mental status is at baseline.  Psychiatric:        Mood and Affect: Mood normal.        Behavior: Behavior normal.     ED Results / Procedures / Treatments   Labs (all labs ordered are listed, but only abnormal results are  displayed) Labs Reviewed  CBC WITH DIFFERENTIAL/PLATELET - Abnormal; Notable for the following components:      Result Value   RBC 3.70 (*)    Hemoglobin 11.5 (*)    HCT 35.1 (*)    All other components within normal limits  D-DIMER, QUANTITATIVE (NOT AT [facility]) - Abnormal; Notable for the following components:   D-Dimer, Quant 5.28 (*)    All other components within normal limits  BASIC METABOLIC PANEL  BRAIN NATRIURETIC PEPTIDE  URINALYSIS, ROUTINE W REFLEX MICROSCOPIC  TROPONIN I (HIGH SENSITIVITY)    EKG EKG Interpretation  Date/Time:  Wednesday December 13 2020 11:04:50 EST Ventricular Rate:  75 PR Interval:    QRS Duration: 95 QT Interval:  407 QTC Calculation: 455 R Axis:   -34 Text Interpretation: Sinus rhythm Left axis deviation Abnormal R-wave progression, early transition No STEMI Confirmed by Octaviano Glow [phone]) on 12/13/2020 11:51:00 AM   Radiology CT Angio Chest PE W and/or Wo Contrast  Result Date: 12/13/2020 CLINICAL DATA:  Shortness of breath EXAM: CT ANGIOGRAPHY CHEST WITH CONTRAST TECHNIQUE: Multidetector CT imaging of the chest was performed using the standard protocol during bolus administration of intravenous contrast. Multiplanar CT image reconstructions and MIPs were obtained to evaluate the vascular anatomy. CONTRAST:  130mL OMNIPAQUE IOHEXOL 350 MG/ML SOLN COMPARISON:  None. FINDINGS: Cardiovascular: There is pulmonary embolus arising from the distal right main pulmonary artery with extension of pulmonary emboli into multiple right lower lobe branches. There are pulmonary emboli seen in the posterior segment left lower lobe branch. No more proximal pulmonary embolus seen on the left. There is no evident right heart strain. There is no appreciable thoracic aortic aneurysm or dissection. Visualized great vessels appear unremarkable. Note that the right innominate and left common carotid arteries arise as a common trunk, an anatomic variant. There is no  pericardial effusion or pericardial thickening. Mediastinum/Nodes: There is a mass arising from the left lobe of the thyroid measuring 1.7  x 1.2 cm. There is no appreciable thoracic adenopathy. No esophageal lesions are evident. Lungs/Pleura: There is mild bibasilar atelectasis. Lungs otherwise are clear. No pulmonary infarct evident. No pleural effusions are evident. Upper Abdomen: There is a cyst in the left lobe of the liver measuring 1.5 x 1.5 cm. There is an apparent double-J stent in the upper left kidney, incompletely visualized. Musculoskeletal: There is endplate concavity at T7 and T12. No blastic or lytic bone lesions are appreciable. There are breast implants bilaterally with peripheral calcification. No chest wall lesions. Review of the MIP images confirms the above findings. IMPRESSION: 1. Pulmonary emboli bilaterally, significantly more on the right than on the left. Pulmonary emboli on the right arise from the distal main pulmonary artery with extension into multiple right lower lobe branches. Pulmonary embolus on the left is in the posterior segment left lower lobe pulmonary artery. No right heart strain. 2.  No thoracic aortic aneurysm or dissection. 3. There is a 1.7 x 1.2 cm mass in the left lobe of the thyroid. Recommend thyroid US per consensus guidelines. (Ref: J Am Coll Radiol. 2015 Feb;12(2): 143-50). 4. Mild bibasilar atelectasis. No edema or airspace opacity. No pleural effusions. 5.  No evident adenopathy. Critical Value/emergent results were called by telephone at the time of interpretation on 12/13/2020 at 2:01 pm to provider Shi Blankenship , who verbally acknowledged these results. Electronically Signed   By: Lowella Grip III M.D.   On: 12/13/2020 14:02   US Venous Img Lower Bilateral  Result Date: 12/13/2020 CLINICAL DATA:  Lower extremity pain and edema.  Pulmonary emboli. EXAM: BILATERAL LOWER EXTREMITY VENOUS DUPLEX ULTRASOUND TECHNIQUE: Gray-scale sonography with graded  compression, as well as color Doppler and duplex ultrasound were performed to evaluate the lower extremity deep venous systems from the level of the common femoral vein and including the common femoral, femoral, profunda femoral, popliteal and calf veins including the posterior tibial, peroneal and gastrocnemius veins when visible. The superficial great saphenous vein was also interrogated. Spectral Doppler was utilized to evaluate flow at rest and with distal augmentation maneuvers in the common femoral, femoral and popliteal veins. COMPARISON:  None. FINDINGS: Contralateral Common Femoral Vein: Respiratory phasicity is normal and symmetric with the symptomatic side. No evidence of thrombus. Normal compressibility. Common Femoral Vein: No evidence of thrombus. Normal compressibility, respiratory phasicity and response to augmentation. Saphenofemoral Junction: No evidence of thrombus. Normal compressibility and flow on color Doppler imaging. Profunda Femoral Vein: No evidence of thrombus. Normal compressibility and flow on color Doppler imaging. Femoral Vein: There is acute deep venous thrombosis throughout the distal aspect of the right femoral vein with loss of compression augmentation in this area. There is essentially absent flow in this area. Popliteal Vein: There is acute appearing deep venous thrombosis throughout the popliteal vein with only minimal flow. No compression or augmentation. Calf Veins: There is acute deep venous thrombosis throughout the calf veins without appreciable augmentation or compression. Essentially absent flow. Superficial Great Saphenous Vein: No evidence of thrombus. Normal compressibility. Venous Reflux:  None. Other Findings:  None. IMPRESSION: Acute appearing deep venous thrombosis in the right distal femoral vein, popliteal vein, and calf vein regions. Suspect source for pulmonary emboli demonstrated earlier in the day. No deep venous thrombosis involving the left lower  extremity. Electronically Signed   By: Lowella Grip III M.D.   On: 12/13/2020 16:39    Procedures Procedures (including critical care time)  Medications Ordered in ED Medications  apixaban (ELIQUIS) tablet 10 mg (  10 mg Oral Given 12/13/20 1615)  iohexol (OMNIPAQUE) 350 MG/ML injection 100 mL (100 mLs Intravenous Contrast Given 12/13/20 1329)    ED Course  I have reviewed the triage vital signs and the nursing notes.  Pertinent labs & imaging results that were available during my care of the patient were reviewed by me and considered in my medical decision making (see chart for details).  75 year old female presented emergency department with fatigue and dyspnea.  Differential is broad and includes new onset congestive heart failure vs pulmonary embolism versus anemia versus ACS versus worsening infection versus other.  Labs ordered and reviewed - bnp 43, Trop 5, Ddimer 5.28, BMP unremarkable, hgb 11.5  ECG reviewed personally without acute ischemic findings CT PE study ordered and reviewed with bilateral PE as noted, no heart strain pattern DVT ultrasound ordered and pending at time of signout Prior medical records reviewed  On subsequent reassessments, patient remained breathing comfortably on room air, denying chest pain, without hypoxia or tachycardia.  No evidence of massive or submassive PE per her clinical exam at this time.  Clinical Course as of 12/13/20 1652  Wed Dec 13, 2020  1532 Bilateral PEs noted on CT scan.  No evidence of heart strain.  Troponin is unremarkable.  This is the symptoms ongoing for at least 1 week.  She has also not hypoxic or tachycardic.  This is all reassuring.  She is pending a DVT ultrasound of the bilateral lower extremities.  I had a long discussion with the patient and her husband at bedside regarding management options for this.  She needs to be on anticoagulation, but I do not see a strong indication for hospitalization at this time.  This  appears to be a provoked PE in the setting of recent COVID diagnosis and also urological procedure.  She would have a preference for management at home, and I think this is reasonable. [MT]  8921 She has a PESI score of 72, low risk, and a simplified PESI score of 0, low risk. [MT]  1554 Signed out to Dr Ralene Bathe pending completion of DVT study, initiation of eliquis, and discharge home anticipated [MT]    Clinical Course User Index [MT] Ginger Leeth, Carola Rhine, MD    Final Clinical Impression(s) / ED Diagnoses Final diagnoses:  Multiple subsegmental pulmonary emboli without acute cor pulmonale ([place])    Rx / DC Orders ED Discharge Orders         Ordered    APIXABAN (ELIQUIS) VTE STARTER PACK (10MG  AND 5MG )        12/13/20 1539           Wyvonnia Dusky, MD 12/13/20 [id number]

## 2020-12-13 NOTE — ED Triage Notes (Signed)
SOB for past 7 days, weakness since kidney stent placed 12/05/2020 on left side, due to stone, stone was not passed or removed

## 2020-12-13 NOTE — ED Notes (Signed)
As I write this, Dr. Langston Masker is speaking with her about dx/plan(s). Her husband is with her also.

## 2020-12-13 NOTE — ED Notes (Signed)
Pt on monitor and vitals cycling 

## 2020-12-13 NOTE — Progress Notes (Addendum)
[place] for apixaban Indication: pulmonary embolus and DVT  Labs: Recent Labs    12/13/20 1200  HGB 11.5*  HCT 35.1*  PLT 216  CREATININE 0.83  TROPONINIHS 5    Estimated Creatinine Clearance: 55.8 mL/min (by C-G formula based on SCr of 0.83 mg/dL).  Assessment: 57 yof found to have a new PE and RLE DVT. To start apixaban and discharge home. Baseline Hgb is slightly low and platelets are WNL. She is not on anticoagulation PTA.   Goal of Therapy:  Therapeutic anticoagulation   Plan:  First dose given in the ED MD sent the VTE starter pack prescription to the pharmacy Provided patient and family member with education and manufacturer coupon  Hayley Walters, Hayley Walters 12/13/2020,4:16 PM

## 2020-12-13 NOTE — ED Notes (Signed)
She tells me that she has had much fatigue and exertional dyspnea x 1 week. "I get exhausted just getting up to do a few dishes". Her skin is normal, warm and dry and she is breathing normally at rest. She also states that she recently underwent left ureteral stenting for 6mm ureteral stone by [facility].

## 2020-12-13 NOTE — Discharge Instructions (Signed)
You were diagnosed today with blood clots in your lungs.  This is likely was causing you to feel short of breath and weak.  I started you on a blood thinner called Eliquis.  This is to prevent your clots from getting larger.  You got your first dose in the ER today.  Your next dose is due tomorrow morning.  You can fill the prescription in our pharmacy before you leave today - they can provide a starter pack with directions for how to take this medication.  Please contact your primary care provider tomorrow to discuss this issue.  Your prescription today covers 30 days - your doctor should continue your blood thinners moving forward.  You should also mention this to your urologist, especially if you have surgical procedures planned.  Please read over the attached instructions carefully.  Information on my medicine - ELIQUIS (apixaban)  This medication education was reviewed with me or my healthcare representative as part of my discharge preparation.   Why was Eliquis prescribed for you? Eliquis was prescribed to treat blood clots that may have been found in the veins of your legs (deep vein thrombosis) or in your lungs (pulmonary embolism) and to reduce the risk of them occurring again.  What do You need to know about Eliquis ? The starting dose is 10 mg (two 5 mg tablets) taken TWICE daily for the FIRST SEVEN (7) DAYS, then the dose is reduced to ONE 5 mg tablet taken TWICE daily.  Eliquis may be taken with or without food.   Try to take the dose about the same time in the morning and in the evening. If you have difficulty swallowing the tablet whole please discuss with your pharmacist how to take the medication safely.  Take Eliquis exactly as prescribed and DO NOT stop taking Eliquis without talking to the doctor who prescribed the medication.  Stopping may increase your risk of developing a new blood clot.  Refill your prescription before you run out.  After discharge, you should have  regular check-up appointments with your healthcare provider that is prescribing your Eliquis.    What do you do if you miss a dose? If a dose of ELIQUIS is not taken at the scheduled time, take it as soon as possible on the same day and twice-daily administration should be resumed. The dose should not be doubled to make up for a missed dose.  Important Safety Information A possible side effect of Eliquis is bleeding. You should call your healthcare provider right away if you experience any of the following: ? Bleeding from an injury or your nose that does not stop. ? Unusual colored urine (red or dark brown) or unusual colored stools (red or black). ? Unusual bruising for unknown reasons. ? A serious fall or if you hit your head (even if there is no bleeding).  Some medicines may interact with Eliquis and might increase your risk of bleeding or clotting while on Eliquis. To help avoid this, consult your healthcare provider or pharmacist prior to using any new prescription or non-prescription medications, including herbals, vitamins, non-steroidal anti-inflammatory drugs (NSAIDs) and supplements.  This website has more information on Eliquis (apixaban): http://www.eliquis.com/eliquis/home

## 2020-12-19 DIAGNOSIS — I82401 Acute embolism and thrombosis of unspecified deep veins of right lower extremity: Secondary | ICD-10-CM | POA: Diagnosis not present

## 2020-12-19 DIAGNOSIS — E079 Disorder of thyroid, unspecified: Secondary | ICD-10-CM | POA: Diagnosis not present

## 2020-12-19 DIAGNOSIS — I2699 Other pulmonary embolism without acute cor pulmonale: Secondary | ICD-10-CM | POA: Diagnosis not present

## 2021-01-08 ENCOUNTER — Other Ambulatory Visit: Payer: Self-pay | Admitting: Urology

## 2021-01-08 NOTE — Patient Instructions (Signed)
DUE TO COVID-19 ONLY ONE VISITOR IS ALLOWED TO COME WITH YOU AND STAY IN THE WAITING ROOM ONLY DURING PRE OP AND PROCEDURE.   IF YOU WILL BE ADMITTED INTO THE HOSPITAL YOU ARE ALLOWED ONE SUPPORT PERSON DURING VISITATION HOURS ONLY (10AM -8PM)   . The support person may change daily. . The support person must pass our screening, gel in and out, and wear a mask at all times, including in the patient's room. . Patients must also wear a mask when staff or their support person are in the room.   COVID SWAB TESTING MUST BE COMPLETED ON:  Tuesday, 01-09-21 @    [address] [place], [place] [address]  (Must self quarantine after testing. Follow instructions on handout.)  [facility] [place] [facility] [place] (Must self quarantine after testing. Follow instructions on handout.)       Your procedure is scheduled on:    Report to [facility]  Entrance   Report to Short Stay at 5:30 AM   [facility])    Report to admitting at AM   Call this number if you have problems the morning of surgery [phone]   Do not eat food :After Midnight.   May have liquids until    day of surgery  CLEAR LIQUID DIET  Foods Allowed                                                                     Foods Excluded  Water, Black Coffee and tea, regular and decaf                             liquids that you cannot  Plain Jell-O in any flavor  (No red)                                           see through such as: Fruit ices (not with fruit pulp)                                     milk, soups, orange juice              Iced Popsicles (No red)                                    All solid food                                   Apple juices Sports drinks like Gatorade (No red) Lightly seasoned clear broth or consume(fat free) Sugar, honey syrup  Sample Menu Breakfast                                Lunch  Supper Cranberry  juice                    Beef broth                            Chicken broth Jell-O                                     Grape juice                           Apple juice Coffee or tea                        Jell-O                                      Popsicle                                                Coffee or tea                        Coffee or tea      Complete one Ensure drink the morning of surgery at       the day of surgery.   Drink 2 Ensure drinks the night before surgery.  Complete one Ensure drink the morning of surgery 3 hours prior to scheduled surgery.     1. The day of surgery:  ? Drink ONE (1) Pre-Surgery Clear Ensure or G2 by am the morning of surgery. Drink in one sitting. Do not sip.  ? This drink was given to you during your hospital  pre-op appointment visit. ? Nothing else to drink after completing the  Pre-Surgery Clear Ensure or G2.          If you have questions, please contact your surgeon's office.     Oral Hygiene is also important to reduce your risk of infection.                                    Remember - BRUSH YOUR TEETH THE MORNING OF SURGERY WITH YOUR REGULAR TOOTHPASTE   Do NOT smoke after Midnight   Take these medicines the morning of surgery with A SIP OF WATER:   DO NOT TAKE ANY ORAL DIABETIC MEDICATIONS DAY OF YOUR SURGERY                               You may not have any metal on your body including hair pins, jewelry, and body piercings             Do not wear make-up, lotions, powders, perfumes/cologne, or deodorant             Do not wear nail polish.  Do not shave  48 hours prior to surgery.              Men may shave face and neck.  Do not bring valuables to the hospital. [facility] IS NOT             RESPONSIBLE   FOR VALUABLES.   Contacts, dentures or bridgework may not be worn into surgery.   Bring small overnight bag day of surgery.    Patients discharged the day of surgery will not be allowed to drive  home.   Special Instructions: Bring a copy of your healthcare power of attorney and living will documents         the day of surgery if you haven't scanned them in before.              Please read over the following fact sheets you were given: IF YOU HAVE QUESTIONS ABOUT YOUR PRE OP INSTRUCTIONS PLEASE CALL [phone]   [facility] - Preparing for Surgery Before surgery, you can play an important role.  Because skin is not sterile, your skin needs to be as free of germs as possible.  You can reduce the number of germs on your skin by washing with CHG (chlorahexidine gluconate) soap before surgery.  CHG is an antiseptic cleaner which kills germs and bonds with the skin to continue killing germs even after washing. Please DO NOT use if you have an allergy to CHG or antibacterial soaps.  If your skin becomes reddened/irritated stop using the CHG and inform your nurse when you arrive at Short Stay. Do not shave (including legs and underarms) for at least 48 hours prior to the first CHG shower.  You may shave your face/neck.  Please follow these instructions carefully:  1.  Shower with CHG Soap the night before surgery and the  morning of surgery.  2.  If you choose to wash your hair, wash your hair first as usual with your normal  shampoo.  3.  After you shampoo, rinse your hair and body thoroughly to remove the shampoo.                             4.  Use CHG as you would any other liquid soap.  You can apply chg directly to the skin and wash.  Gently with a scrungie or clean washcloth.  5.  Apply the CHG Soap to your body ONLY FROM THE NECK DOWN.   Do   not use on face/ open                           Wound or open sores. Avoid contact with eyes, ears mouth and   genitals (private parts).                       Wash face,  Genitals (private parts) with your normal soap.             6.  Wash thoroughly, paying special attention to the area where your    surgery  will be performed.  7.  Thoroughly  rinse your body with warm water from the neck down.  8.  DO NOT shower/wash with your normal soap after using and rinsing off the CHG Soap.                9.  Pat yourself dry with a clean towel.            10.  Wear clean pajamas.  11.  Place clean sheets on your bed the night of your first shower and do not  sleep with pets. Day of Surgery : Do not apply any lotions/deodorants the morning of surgery.  Please wear clean clothes to the hospital/surgery center.  FAILURE TO FOLLOW THESE INSTRUCTIONS MAY RESULT IN THE CANCELLATION OF YOUR SURGERY  PATIENT SIGNATURE_________________________________  NURSE SIGNATURE__________________________________  ________________________________________________________________________

## 2021-01-08 NOTE — Progress Notes (Addendum)
COVID Vaccine Completed:  x1 Date COVID Vaccine completed:  11-21 COVID vaccine manufacturer: [employer]   Date of COVID positive in last 90 days:  11-12-20 results on chart  PCP - Dorothea Glassman, MD Cardiologist - N/A  Chest x-ray - CT chest 12-13-20 in Epic EKG - 12-13-20 in Epic Stress Test -  ECHO -  Cardiac Cath -  Pacemaker/ICD device last checked:  Sleep Study - N/A CPAP -   Fasting Blood Sugar - N/A Checks Blood Sugar _____ times a day  Blood Thinner Instructions:  Eliquis 5 mg.  To remain on Eliquis for procedure. Aspirin Instructions: Last Dose:  Activity level:  Patient is able to climb a flight of stairs but does have shortness of breath due to recent Covid and bilateral PE.  She is able to do housework     Anesthesia review: Recent bilateral pulmonary emoboli  Patient denies shortness of breath, at rest but does have with exertion.  Denies fever, cough and chest pain at PAT appointment   Patient verbalized understanding of instructions that were given to them at the PAT appointment. Patient was also instructed that they will need to review over the PAT instructions again at home before surgery.

## 2021-01-08 NOTE — Patient Instructions (Addendum)
DUE TO COVID-19 ONLY ONE VISITOR IS ALLOWED TO COME WITH YOU AND STAY IN THE WAITING ROOM ONLY DURING PRE OP AND PROCEDURE.      Your procedure is scheduled on: Friday, 01-12-21    Report to [facility] Main  Entrance    Report to admitting at 7:00 AM   Call this number if you have problems the morning of surgery [phone]   Do not eat food :After Midnight.   May have liquids until 6:00 AM day of surgery  CLEAR LIQUID DIET  Foods Allowed                                                                     Foods Excluded  Water, Black Coffee and tea, regular and decaf             liquids that you cannot  Plain Jell-O in any flavor  (No red)                                    see through such as: Fruit ices (not with fruit pulp)                                      milk, soups, orange juice              Iced Popsicles (No red)                                      All solid food                                   Apple juices Sports drinks like Gatorade (No red) Lightly seasoned clear broth or consume(fat free) Sugar, honey syrup        Oral Hygiene is also important to reduce your risk of infection.                                    Remember - BRUSH YOUR TEETH THE MORNING OF SURGERY WITH YOUR REGULAR TOOTHPASTE   Do NOT smoke after Midnight   Take these medicines the morning of surgery with A SIP OF WATER:  Sertraline, Topiramate, Cephalexin, Xanax if needed.  Okay to use  inhalers and  bring with you day of surgery                               You may not have any metal on your body including hair pins, jewelry, and body piercings             Do not wear make-up, lotions, powders, perfumes/cologne, or deodorant             Do not wear nail polish.  Do not shave  48 hours prior to surgery.  Do not bring valuables to the hospital. [place].   Contacts, dentures or bridgework may not be worn into surgery.    Patients  discharged the day of surgery will not be allowed to drive home.   Special Instructions: Bring a copy of your healthcare power of attorney and living will documents the day of surgery if you haven't  scanned them in before.              Please read over the following fact sheets you were given: IF YOU HAVE QUESTIONS ABOUT YOUR PRE OP INSTRUCTIONS PLEASE CALL  [facility] - Preparing for Surgery Before surgery, you can play an important role.  Because skin is not sterile, your skin needs to be as free of germs as possible.  You can reduce the number of germs on your skin by washing with CHG (chlorahexidine gluconate) soap before surgery.  CHG is an antiseptic cleaner which kills germs and bonds with the skin to continue killing germs even after washing. Please DO NOT use if you have an allergy to CHG or antibacterial soaps.  If your skin becomes reddened/irritated stop using the CHG and inform your nurse when you arrive at Short Stay. Do not shave (including legs and underarms) for at least 48 hours prior to the first CHG shower.  You may shave your face/neck.  Please follow these instructions carefully:  1.  Shower with CHG Soap the night before surgery and the  morning of surgery.  2.  If you choose to wash your hair, wash your hair first as usual with your normal  shampoo.  3.  After you shampoo, rinse your hair and body thoroughly to remove the shampoo.                             4.  Use CHG as you would any other liquid soap.  You can apply chg directly to the skin and wash.  Gently with a scrungie or clean washcloth.  5.  Apply the CHG Soap to your body ONLY FROM THE NECK DOWN.   Do   not use on face/ open                           Wound or open sores. Avoid contact with eyes, ears mouth and   genitals (private parts).                       Wash face,  Genitals (private parts) with your normal soap.             6.  Wash thoroughly, paying special attention to the area  where your    surgery  will be performed.  7.  Thoroughly rinse your body with warm water from the neck down.  8.  DO NOT shower/wash with your normal soap after using and rinsing off the CHG Soap.                9.  Pat yourself dry with a clean towel.            10.  Wear clean pajamas.            11.  Place clean sheets on your bed the night of your first shower and do not  sleep with pets. Day of  Surgery : Do not apply any lotions/deodorants the morning of surgery.  Please wear clean clothes to the hospital/surgery center.  FAILURE TO FOLLOW THESE INSTRUCTIONS MAY RESULT IN THE CANCELLATION OF YOUR SURGERY  PATIENT SIGNATURE_________________________________  NURSE SIGNATURE__________________________________  ________________________________________________________________________

## 2021-01-09 ENCOUNTER — Other Ambulatory Visit: Payer: Self-pay

## 2021-01-09 ENCOUNTER — Encounter (HOSPITAL_COMMUNITY)
Admission: RE | Admit: 2021-01-09 | Discharge: 2021-01-09 | Disposition: A | Discharge status: Home / Self Care | Payer: PPO | Source: Ambulatory Visit | Attending: Urology | Admitting: Urology

## 2021-01-09 ENCOUNTER — Encounter (HOSPITAL_COMMUNITY): Payer: Self-pay

## 2021-01-09 DIAGNOSIS — Z01812 Encounter for preprocedural laboratory examination: Secondary | ICD-10-CM | POA: Diagnosis not present

## 2021-01-09 HISTORY — DX: Personal history of urinary calculi: Z87.442

## 2021-01-09 HISTORY — DX: Other pulmonary embolism without acute cor pulmonale: I26.99

## 2021-01-09 LAB — BASIC METABOLIC PANEL
Anion gap: 9 (ref 5–15)
BUN: 13 mg/dL (ref 8–23)
CO2: 24 mmol/L (ref 22–32)
Calcium: 9.1 mg/dL (ref 8.9–10.3)
Chloride: 110 mmol/L (ref 98–111)
Creatinine, Ser: 0.84 mg/dL (ref 0.44–1.00)
GFR, Estimated: >60 mL/min (ref >60)
Glucose, Bld: 101 mg/dL — ABNORMAL HIGH (ref 70–99)
Potassium: 3.8 mmol/L (ref 3.5–5.1)
Sodium: 143 mmol/L (ref 135–145)

## 2021-01-09 LAB — CBC
HCT: 36.8 % (ref 36.0–46.0)
Hemoglobin: 11.9 g/dL — ABNORMAL LOW (ref 12.0–15.0)
MCH: 31.9 pg (ref 26.0–34.0)
MCHC: 32.3 g/dL (ref 30.0–36.0)
MCV: 98.7 fL (ref 80.0–100.0)
Platelets: 141 10*3/uL — ABNORMAL LOW (ref 150–400)
RBC: 3.73 MIL/uL — ABNORMAL LOW (ref 3.87–5.11)
RDW: 13.2 % (ref 11.5–15.5)
WBC: 3 10*3/uL — ABNORMAL LOW (ref 4.0–10.5)
nRBC: 0 % (ref 0.0–0.2)

## 2021-01-11 NOTE — Progress Notes (Signed)
Anesthesia Chart Review   Case: [id number] Date/Time: 01/12/21 0845   Procedure: CYSTOSCOPY/RETROGRADE/URETEROSCOPY/HOLMIUM LASER/STENT PLACEMENT (Left ) - ONLY NEEDS 70 MIN   Anesthesia type: Choice   Pre-op diagnosis: LEFT URETERAL STONE   Location: [facility] / [facility]   Surgeons: Festus Aloe, MD      DISCUSSION:75 y.o. former smoker with h/o non-alcoholic fatty liver disease, bilateral PE, left ureteral stone scheduled for above procedure 01/12/21 with Dr.   Lequita Halt in ED 12/13/20 with provoked bilateral PE in setting of recent COVID dx and urological procedure.  Started on Eliquis.  She had a follow up with PCP 12/19/20. Stable at this visit.  PCP aware of upcoming urological procedure.  Pt has been advised to remain on Eliquis.  VS: BP 129/72   Pulse 70   Temp 36.8 C (Oral)   Resp 16   Ht 5' (1.524 m)   Wt 70.9 kg   SpO2 100%   BMI 30.54 kg/m   PROVIDERS: Nicoletta Dress, MD is PCP    LABS: Labs reviewed: Acceptable for surgery. (all labs ordered are listed, but only abnormal results are displayed)  Labs Reviewed  BASIC METABOLIC PANEL - Abnormal; Notable for the following components:      Result Value   Glucose, Bld 101 (*)    All other components within normal limits  CBC - Abnormal; Notable for the following components:   WBC 3.0 (*)    RBC 3.73 (*)    Hemoglobin 11.9 (*)    Platelets 141 (*)    All other components within normal limits     IMAGES:   EKG: 12/13/20 Rate 75  Sinus rhythm Left axis deviation Abnormal R-wave progression, early transition No STEMI  CV:  Past Medical History:  Diagnosis Date  . Acute carpal tunnel syndrome of right wrist 07/21/2015  . Anemia   . Arthritis   . Cancer (HCC)    Basal cell   . Colon polyps   . Depression    situational  . Fracture of radius, distal, right, closed 07/21/2015  . Headache    migraines  . History of kidney stones   . IBS (irritable bowel syndrome)   . Non-alcoholic fatty liver  disease    mild  . Osteoporosis   . Pneumonia    as a child  . Pulmonary emboli [facility])     Past Surgical History:  Procedure Laterality Date  . ABDOMINAL HYSTERECTOMY    . BREAST SURGERY     augmentation  . CARPAL TUNNEL RELEASE Right 07/21/2015   Procedure: RIGHT CARPAL TUNNEL RELEASE ;  Surgeon: Marchia Bond, MD;  Location: [facility];  Service: Orthopedics;  Laterality: Right;  . COLONOSCOPY    . CYSTOSCOPY/URETEROSCOPY/HOLMIUM LASER/STENT PLACEMENT Left 12/05/2020   Procedure: CYSTOSCOPY/LEFT RETROGRADE PLYOGRAM/LEFT DIAGNOSTIC URETEROSCOPY AND STENT PLACEMENT;  Surgeon: Festus Aloe, MD;  Location[facility];  Service: Urology;  Laterality: Left;  ONLY NEEDS 60 MIN  . FRACTURE SURGERY Left    hip/femur  . FRACTURE SURGERY Right    screws in right hip for stress fracture  . OPEN REDUCTION INTERNAL FIXATION (ORIF) DISTAL RADIAL FRACTURE Right 07/21/2015   Procedure: OPEN REDUCTION INTERNAL FIXATION (ORIF) RIGHT DISTAL RADIAL FRACTURE;  Surgeon: Marchia Bond, MD;  Location: [facility];  Service: Orthopedics;  Laterality: Right;  . TONSILLECTOMY      MEDICATIONS: . albuterol (VENTOLIN HFA) 108 (90 Base) MCG/ACT inhaler  . ALPRAZolam (XANAX) 0.5 MG tablet  . apixaban (ELIQUIS)  5 MG TABS tablet  . APIXABAN (ELIQUIS) VTE STARTER PACK (10MG  AND 5MG )  . cephALEXin (KEFLEX) 250 MG capsule  . nitrofurantoin (MACRODANTIN) 50 MG capsule  . rizatriptan (MAXALT-MLT) 10 MG disintegrating tablet  . sertraline (ZOLOFT) 100 MG tablet  . topiramate (TOPAMAX) 50 MG tablet   . triamcinolone acetonide (KENALOG) 10 MG/ML injection 10 mg   Konrad Felix, PA-C [facility] Pre-Surgical Testing [phone]

## 2021-01-11 NOTE — H&P (Signed)
Office Visit Report     12/13/2020   --------------------------------------------------------------------------------   Hayley Walters  MRN: [id number]  DOB: 02/09/1946, 75 year old Female  SSN:    PRIMARY CARE:  Nicoletta Dress, MD  REFERRING:  Nicoletta Dress, MD  PROVIDER:  Festus Aloe, M.D.  TREATING:  Daine Gravel, NP  LOCATION:  [facility] [phone]     --------------------------------------------------------------------------------   CC/HPI: Hayley Walters was referred for a 6 mm left proximal ureteral stone on CT 11/29/2020. There were no other stones. It was difficult to see on the scout. She has had left-sided abdominal pain since 11/25/2020. She was started on Cipro and Flagyl.   She hasn't seen a stone pass. She has nausea with the pain. No emesis. She is staying hydrated. No tamsulosin. KUB today equivocal with contrast in colon.   No prior stones.    12/13/20: Hayley Walters is a 75 year old female who underwent left ureteroscopy on 12/07/20 for a 44mm left proximal stone. Unfortunately, the procedure was not successsful due to a tight ureter and a stent was left in place. A few weeks prior to URS, she was diagnosed with Covid. She reports that her symptoms were considered mild and mostly consisted of nasal congestion, cough and low grade fevers. Today she presents with concerns of shortness of breath with exertion, back pain, and extreme fatigue. She reports that she was experiencing some right-sided calf pain and discomfort, this occurred about a week and a half to 2 weeks ago and lasted for a few days. Over the past week she has noticed that she gets fatigued extremely easily and has shortness of breath with minimal exertion. She also reports constant back pain located in her upper back. This is worse when taking a deep inspiration. She denies fevers, chills. She denies feeling her heart racing. She denies a history of blood clots. Her urine does have moderate  bacteriuria however she is not experiencing any dysuria.     ALLERGIES: None   MEDICATIONS: Tamsulosin Hcl 0.4 mg capsule 1 capsule PO Q HS  Alprazolam 0.5 mg tablet  Calcium  Ciprofloxacin Hcl 500 mg tablet  Forteo 20 mcg/dose (600 mcg/2.4 ml) pen injector  Hydrocodone-Acetaminophen 5 mg-325 mg tablet  Maxalt Mlt 10 mg tablet,disintegrating  Metronidazole 500 mg tablet  Oxycodone Hcl 10 mg tablet  Proair Hfa 90 mcg hfa aerosol with adapter  Sertraline Hcl 100 mg tablet  Topiramate 50 mg tablet  Valacyclovir 1,000 mg tablet  Vitamin D3     GU PSH: Hysterectomy     NON-GU PSH: Breast augmentation Carpal tunnel surgery, Bilateral Cataract surgery, Bilateral C-Section Foot surgery (unspecified), Right Hip Arthroscopy/surgery, Bilateral Tonsillectomy     GU PMH: Ureteral calculus, Urine sent for cx as a precaution. She was given toradol 60 mg IM today and promethazine 25 mg IM. Feeling much better. KUB equivocal. I discussed with the patient the nature risks and benefits of continued stone passage, off label use of alpha blockers, shockwave lithotripsy or ureteroscopy. All questions answered. Will go ahead and set up something for next Monday or Tuesday in event stone doesn't pass - cysto, left RGP, left URS, stent - disc staged procedure a possibility or she might pass stone. - 11/30/2020    NON-GU PMH: Arthritis Fracture of unspecified carpal bone, left wrist, initial encounter for closed fracture    FAMILY HISTORY: Death of family member - Mother, Father   SOCIAL HISTORY: Marital Status: Married Preferred Language: English; Ethnicity:  Not Hispanic Or Latino; Race: White Current Smoking Status: Patient does not smoke anymore. Has not smoked since 11/01/2000. Smoked for 10 years. Smoked 1 pack per day.   Tobacco Use Assessment Completed: Used Tobacco in last 30 days? Does not use smokeless tobacco. Has never drank.  Drinks 3 caffeinated drinks per day.    REVIEW OF  SYSTEMS:    GU Review Female:   Patient denies frequent urination, hard to postpone urination, burning /pain with urination, get up at night to urinate, leakage of urine, stream starts and stops, trouble starting your stream, have to strain to urinate, and being pregnant.  Gastrointestinal (Upper):   Patient denies nausea, vomiting, and indigestion/ heartburn.  Gastrointestinal (Lower):   Patient denies diarrhea and constipation.  Constitutional:   Patient reports fatigue. Patient denies fever, night sweats, and weight loss.  Skin:   Patient denies skin rash/ lesion and itching.  Eyes:   Patient denies blurred vision and double vision.  Ears/ Nose/ Throat:   Patient denies sore throat and sinus problems.  Hematologic/Lymphatic:   Patient denies swollen glands and easy bruising.  Cardiovascular:   Patient denies leg swelling and chest pains.  Respiratory:   Patient reports shortness of breath. Patient denies cough.  Endocrine:   Patient denies excessive thirst.  Musculoskeletal:   Patient reports back pain. Patient denies joint pain.  Neurological:   Patient denies headaches and dizziness.  Psychologic:   Patient denies depression and anxiety.   Notes: Weakness, "feeling cold", pain at waistline, upper back discomfort/pressure, diarrhea resolved    VITAL SIGNS:      12/13/2020 08:56 AM  Weight 159 lb / 72.12 kg  BP 118/76 mmHg  Pulse 78 /min  Temperature 97.6 F / 36.4 C   MULTI-SYSTEM PHYSICAL EXAMINATION:    Constitutional: Well-nourished. No physical deformities. Normally developed. Good grooming.  Respiratory: No labored breathing, no use of accessory muscles.   Cardiovascular: Normal temperature, normal extremity pulses, no swelling, no varicosities.  Lymphatic: No enlargement of neck, axillae, groin.  Skin: Skin has decreased turgor, pale. No jaundice, no cyanosis. No lesion, no ulcer, no rash.   Neurologic / Psychiatric: Oriented to time, oriented to place, oriented to person. No  depression, no anxiety, no agitation.  Gastrointestinal: No mass, no tenderness, no rigidity, non obese abdomen.     Complexity of Data:  Source Of History:  Patient, Family/Caregiver, Medical Record Summary  Records Review:   Previous Doctor Records, Previous Hospital Records, Previous Patient Records  Urine Test Review:   Urinalysis   12/13/20 12/13/20  Urinalysis  Urine Appearance Slightly Cloudy  Slightly Cloudy   Urine Specimen  Voided   Urine Color Orange  Orange   Urine Glucose Invalid mg/dL Invalid   Urine Bilirubin Invalid mg/dL Invalid   Urine Ketones Invalid mg/dL Invalid   Urine Specific Gravity Invalid  Invalid   Urine Blood Invalid ery/uL Invalid   Urine pH Invalid  Invalid   Urine Protein Invalid mg/dL Invalid   Urine Urobilinogen Invalid mg/dL Invalid   Urine Nitrites Invalid  Invalid   Urine Leukocyte Esterase Invalid leu/uL Invalid   Urine WBC/hpf 10 - 20/hpf  10 - 20/hpf   Urine RBC/hpf 3 - 10/hpf  3 - 10/hpf   Urine Epithelial Cells 0 - 5/hpf  0 - 5/hpf   Urine Bacteria Mod (26-50/hpf)  Mod (26-50/hpf)   Urine Mucous Present  Present   Urine Yeast NS (Not Seen)  NS (Not Seen)   Urine Trichomonas Not Present  Not Present   Urine Cystals NS (Not Seen)  NS (Not Seen)   Urine Casts Granular  Granular   Urine Sperm Not Present  Not Present    PROCEDURES:          Urinalysis w/Scope - 81001 Dipstick Dipstick Cont'd Micro  Specimen: Voided Bilirubin: Invalid WBC/hpf: 10 - 20/hpf  Color: Orange Ketones: Invalid RBC/hpf: 3 - 10/hpf  Appearance: Slightly Cloudy Blood: Invalid Bacteria: Mod (26-50/hpf)  Specific Gravity: Invalid Protein: Invalid Cystals: NS (Not Seen)  pH: Invalid Urobilinogen: Invalid Casts: Granular  Glucose: Invalid Nitrites: Invalid Trichomonas: Not Present    Leukocyte Esterase: Invalid Mucous: Present      Epithelial Cells: 0 - 5/hpf      Yeast: NS (Not Seen)      Sperm: Not Present    Notes:  No dip due to color    ASSESSMENT:       ICD-10 Details  1 GU:   Ureteral calculus - N20.1 Left, Acute, Systemic Symptoms  2 NON-GU:   Other pulmonary embolism without acute cor pulmonale - I26.99 Acute, Life Threatening   PLAN:           Orders Labs CULTURE, URINE  X-Rays: Outside CT With I.V. Contrast  X-Ray Notes: STAT CTA for shortness of breath, right calf pain and recent surgery. If the result is positive please send to the ED and please call with results          Schedule         Document Letter(s):  Created for Patient: Clinical Summary         Notes:   Based off her recent surgical procedure, recent COVID-19 infection and symptoms, I am very concern for a pulmonary embolus versus pneumonia versus cardiac complication. Advised she proceed to outside imaging for a CT angio. I would then like her to follow up in the emergency department given her symptoms. She and her husband were in agreeance with this. CT angio was concerning for bilateral pulmonary embolus. This is being managed by the emergency department. She will need follow-up for further stone management. Urine sent for culture today and I will address this accordingly if warranted.         Next Appointment:      Next Appointment: 01/17/2021 10:30 AM    Appointment Type: Renal Ultrasound    Location: [facility] [phone]    Provider: Radiology Rm1 Radiology Rm 1    Reason for Visit: PO 6 WEEKS WITH LIMITED LEFT RENAL US PRIOR      * Signed by Daine Gravel, NP on 12/13/20 at 7:01 PM (EST*     The information contained in this medical record document is considered private and confidential patient information. This information can only be used for the medical diagnosis and/or medical services that are being provided by the patient's selected caregivers. This information can only be distributed outside of the patient's care if the patient agrees and signs waivers of authorization for this information to be sent to an outside source or  route.  Addendum: She did have PE and started anticoagulation which she can continue. Pre-op clearance by Dr. Delena Bali. Urine cx grew a low count of e coli and she was started on cephalexin 250 mg and remains on it QHS.

## 2021-01-12 ENCOUNTER — Ambulatory Visit (HOSPITAL_COMMUNITY): Payer: PPO

## 2021-01-12 ENCOUNTER — Ambulatory Visit (HOSPITAL_COMMUNITY)
Admission: RE | Admit: 2021-01-12 | Discharge: 2021-01-12 | Disposition: A | Discharge status: Home / Self Care | Payer: PPO | Attending: Urology | Admitting: Urology

## 2021-01-12 ENCOUNTER — Encounter (HOSPITAL_COMMUNITY): Payer: Self-pay | Admitting: Urology

## 2021-01-12 ENCOUNTER — Ambulatory Visit (HOSPITAL_COMMUNITY): Payer: PPO | Admitting: Anesthesiology

## 2021-01-12 ENCOUNTER — Ambulatory Visit (HOSPITAL_COMMUNITY): Payer: PPO | Admitting: Physician Assistant

## 2021-01-12 ENCOUNTER — Encounter (HOSPITAL_COMMUNITY)
Admission: RE | Disposition: A | Discharge status: Home / Self Care | Payer: Self-pay | Source: Home / Self Care | Attending: Urology

## 2021-01-12 DIAGNOSIS — D649 Anemia, unspecified: Secondary | ICD-10-CM | POA: Diagnosis not present

## 2021-01-12 DIAGNOSIS — Z87891 Personal history of nicotine dependence: Secondary | ICD-10-CM | POA: Insufficient documentation

## 2021-01-12 DIAGNOSIS — N201 Calculus of ureter: Secondary | ICD-10-CM

## 2021-01-12 DIAGNOSIS — Z79899 Other long term (current) drug therapy: Secondary | ICD-10-CM | POA: Insufficient documentation

## 2021-01-12 DIAGNOSIS — Z86718 Personal history of other venous thrombosis and embolism: Secondary | ICD-10-CM | POA: Insufficient documentation

## 2021-01-12 DIAGNOSIS — Z79891 Long term (current) use of opiate analgesic: Secondary | ICD-10-CM | POA: Insufficient documentation

## 2021-01-12 DIAGNOSIS — I2699 Other pulmonary embolism without acute cor pulmonale: Secondary | ICD-10-CM | POA: Diagnosis not present

## 2021-01-12 DIAGNOSIS — G43909 Migraine, unspecified, not intractable, without status migrainosus: Secondary | ICD-10-CM | POA: Diagnosis not present

## 2021-01-12 DIAGNOSIS — Z8616 Personal history of COVID-19: Secondary | ICD-10-CM | POA: Diagnosis not present

## 2021-01-12 HISTORY — PX: CYSTOSCOPY/URETEROSCOPY/HOLMIUM LASER/STENT PLACEMENT: SHX6546

## 2021-01-12 SURGERY — CYSTOSCOPY/URETEROSCOPY/HOLMIUM LASER/STENT PLACEMENT
Anesthesia: General | Site: Ureter | Laterality: Left

## 2021-01-12 MED ORDER — DEXAMETHASONE SODIUM PHOSPHATE 10 MG/ML IJ SOLN
INTRAMUSCULAR | Status: AC
  Filled 2021-01-12: qty 1

## 2021-01-12 MED ORDER — ACETAMINOPHEN 500 MG PO TABS
ORAL_TABLET | ORAL | Status: AC
  Administered 2021-01-12: 1000 mg via ORAL
  Filled 2021-01-12: qty 2

## 2021-01-12 MED ORDER — ONDANSETRON HCL 4 MG/2ML IJ SOLN
INTRAMUSCULAR | Status: AC
  Filled 2021-01-12: qty 2

## 2021-01-12 MED ORDER — LACTATED RINGERS IV SOLN: INTRAVENOUS | Status: DC

## 2021-01-12 MED ORDER — FENTANYL CITRATE (PF) 100 MCG/2ML IJ SOLN
INTRAMUSCULAR | Status: AC
  Filled 2021-01-12: qty 2

## 2021-01-12 MED ORDER — CHLORHEXIDINE GLUCONATE 0.12 % MT SOLN
15.0000 mL | Freq: Once | OROMUCOSAL | Status: AC
  Administered 2021-01-12: 15 mL via OROMUCOSAL

## 2021-01-12 MED ORDER — PROPOFOL 10 MG/ML IV BOLUS
INTRAVENOUS | Status: DC | PRN
  Administered 2021-01-12: 100 mg via INTRAVENOUS

## 2021-01-12 MED ORDER — CEFAZOLIN SODIUM-DEXTROSE 2-4 GM/100ML-% IV SOLN
2.0000 g | Freq: Once | INTRAVENOUS | Status: AC
  Administered 2021-01-12: 2 g via INTRAVENOUS

## 2021-01-12 MED ORDER — LIDOCAINE HCL (PF) 2 % IJ SOLN
INTRAMUSCULAR | Status: AC
  Filled 2021-01-12: qty 5

## 2021-01-12 MED ORDER — ORAL CARE MOUTH RINSE: 15.0000 mL | Freq: Once | OROMUCOSAL | Status: AC

## 2021-01-12 MED ORDER — ACETAMINOPHEN 500 MG PO TABS: 1000.0000 mg | ORAL_TABLET | Freq: Once | ORAL | Status: AC

## 2021-01-12 MED ORDER — SODIUM CHLORIDE 0.9 % IR SOLN
Status: DC | PRN
  Administered 2021-01-12: 1000 mL

## 2021-01-12 MED ORDER — DEXAMETHASONE SODIUM PHOSPHATE 10 MG/ML IJ SOLN
INTRAMUSCULAR | Status: DC | PRN
  Administered 2021-01-12: 8 mg via INTRAVENOUS

## 2021-01-12 MED ORDER — FENTANYL CITRATE (PF) 100 MCG/2ML IJ SOLN
INTRAMUSCULAR | Status: DC | PRN
  Administered 2021-01-12 (×4): 25 ug via INTRAVENOUS

## 2021-01-12 MED ORDER — EPHEDRINE SULFATE-NACL 50-0.9 MG/10ML-% IV SOSY
PREFILLED_SYRINGE | INTRAVENOUS | Status: DC | PRN
  Administered 2021-01-12: 10 mg via INTRAVENOUS

## 2021-01-12 MED ORDER — FENTANYL CITRATE (PF) 100 MCG/2ML IJ SOLN: 25.0000 ug | INTRAMUSCULAR | Status: DC | PRN

## 2021-01-12 MED ORDER — PROPOFOL 10 MG/ML IV BOLUS
INTRAVENOUS | Status: AC
  Filled 2021-01-12: qty 20

## 2021-01-12 MED ORDER — CEFAZOLIN SODIUM-DEXTROSE 2-4 GM/100ML-% IV SOLN
INTRAVENOUS | Status: AC
  Filled 2021-01-12: qty 100

## 2021-01-12 MED ORDER — IOHEXOL 300 MG/ML  SOLN
INTRAMUSCULAR | Status: DC | PRN
  Administered 2021-01-12: 10 mL

## 2021-01-12 MED ORDER — LIDOCAINE 2% (20 MG/ML) 5 ML SYRINGE
INTRAMUSCULAR | Status: DC | PRN
  Administered 2021-01-12: 80 mg via INTRAVENOUS

## 2021-01-12 MED ORDER — SODIUM CHLORIDE 0.9 % IR SOLN
Status: DC | PRN
  Administered 2021-01-12: 3000 mL

## 2021-01-12 MED ORDER — EPHEDRINE 5 MG/ML INJ
INTRAVENOUS | Status: AC
  Filled 2021-01-12: qty 10

## 2021-01-12 MED ORDER — ONDANSETRON HCL 4 MG/2ML IJ SOLN
INTRAMUSCULAR | Status: DC | PRN
  Administered 2021-01-12: 4 mg via INTRAVENOUS

## 2021-01-12 MED ORDER — ONDANSETRON HCL 4 MG/2ML IJ SOLN: 4.0000 mg | Freq: Once | INTRAMUSCULAR | Status: DC | PRN

## 2021-01-12 SURGICAL SUPPLY — 24 items
BAG URO CATCHER STRL LF (MISCELLANEOUS) ×3 IMPLANT
BASKET ZERO TIP NITINOL 2.4FR (BASKET) ×2 IMPLANT
BSKT STON RTRVL ZERO TP 2.4FR (BASKET) ×1
CATH INTERMIT  6FR 70CM (CATHETERS) ×3 IMPLANT
CATH URET 5FR 28IN CONE TIP (BALLOONS)
CATH URET 5FR 70CM CONE TIP (BALLOONS) IMPLANT
CLOTH BEACON ORANGE TIMEOUT ST (SAFETY) ×3 IMPLANT
EXTRACTOR STONE NITINOL NGAGE (UROLOGICAL SUPPLIES) ×2 IMPLANT
GLOVE SURG ENC MOIS LTX SZ7.5 (GLOVE) ×3 IMPLANT
GOWN STRL REUS W/TWL XL LVL3 (GOWN DISPOSABLE) ×3 IMPLANT
GUIDEWIRE STR DUAL SENSOR (WIRE) ×3 IMPLANT
GUIDEWIRE ZIPWRE .038 STRAIGHT (WIRE) ×2 IMPLANT
KIT TURNOVER KIT A (KITS) ×3 IMPLANT
LASER FIB FLEXIVA PULSE ID 365 (Laser) IMPLANT
MANIFOLD NEPTUNE II (INSTRUMENTS) ×3 IMPLANT
PACK CYSTO (CUSTOM PROCEDURE TRAY) ×3 IMPLANT
SHEATH URETERAL 12FRX28CM (UROLOGICAL SUPPLIES) ×2 IMPLANT
SHEATH URETERAL 12FRX35CM (MISCELLANEOUS) IMPLANT
STENT URET 6FRX26 CONTOUR (STENTS) ×2 IMPLANT
TRACTIP FLEXIVA PULS ID 200XHI (Laser) IMPLANT
TRACTIP FLEXIVA PULSE ID 200 (Laser) ×3
TUBING CONNECTING 10 (TUBING) ×2 IMPLANT
TUBING CONNECTING 10' (TUBING) ×1
TUBING UROLOGY SET (TUBING) ×3 IMPLANT

## 2021-01-12 NOTE — Interval H&P Note (Signed)
History and Physical Interval Note:  01/12/2021 9:03 AM  Hayley Walters  has presented today for surgery, with the diagnosis of LEFT URETERAL STONE.  The various methods of treatment have been discussed with the patient and family. After consideration of risks, benefits and other options for treatment, the patient has consented to  Procedure(s) with comments: CYSTOSCOPY/RETROGRADE/URETEROSCOPY/HOLMIUM LASER/STENT PLACEMENT (Left) - ONLY NEEDS 70 MIN as a surgical intervention. SHe is on nightly abx. No fever or dysuria. Mild gross hematuria. On anticoagulant. No SOB, CP, etc.  The patient's history has been reviewed, patient examined, no change in status, stable for surgery.  I have reviewed the patient's chart and labs.  Questions were answered to the patient's satisfaction.     Festus Aloe

## 2021-01-12 NOTE — Anesthesia Procedure Notes (Signed)
Procedure Name: LMA Insertion Date/Time: 01/12/2021 9:24 AM Performed by: Sharlette Dense, CRNA Patient Re-evaluated:Patient Re-evaluated prior to induction Oxygen Delivery Method: Circle system utilized Preoxygenation: Pre-oxygenation with 100% oxygen Induction Type: IV induction Ventilation: Mask ventilation without difficulty LMA: LMA inserted LMA Size: 4.0 Number of attempts: 1 Placement Confirmation: breath sounds checked- equal and bilateral and positive ETCO2 Tube secured with: Tape

## 2021-01-12 NOTE — Transfer of Care (Signed)
Immediate Anesthesia Transfer of Care Note  Patient: Hayley Walters  Procedure(s) Performed: CYSTOSCOPY/RETROGRADE/URETEROSCOPY/HOLMIUM LASER/STENT PLACEMENT (Left Ureter)  Patient Location: PACU  Anesthesia Type:General  Level of Consciousness: awake  Airway & Oxygen Therapy: Patient Spontanous Breathing and Patient connected to face mask oxygen  Post-op Assessment: Report given to RN and Post -op Vital signs reviewed and stable  Post vital signs: Reviewed and stable  Last Vitals:  Vitals Value Taken Time  BP 149/67 01/12/21 1041  Temp    Pulse 72 01/12/21 1042  Resp 8 01/12/21 1042  SpO2 100 % 01/12/21 1042  Vitals shown include unvalidated device data.  Last Pain:  Vitals:   01/12/21 0729  TempSrc:   PainSc: 0-No pain         Complications: No complications documented.

## 2021-01-12 NOTE — Anesthesia Postprocedure Evaluation (Signed)
Anesthesia Post Note  Patient: KYLII ENNIS  Procedure(s) Performed: CYSTOSCOPY/RETROGRADE/URETEROSCOPY/HOLMIUM LASER/STENT PLACEMENT (Left Ureter)     Patient location during evaluation: PACU Anesthesia Type: General Level of consciousness: awake and alert Pain management: pain level controlled Vital Signs Assessment: post-procedure vital signs reviewed and stable Respiratory status: spontaneous breathing, nonlabored ventilation, respiratory function stable and patient connected to nasal cannula oxygen Cardiovascular status: blood pressure returned to baseline and stable Postop Assessment: no apparent nausea or vomiting Anesthetic complications: no   No complications documented.  Last Vitals:  Vitals:   01/12/21 1111 01/12/21 1119  BP: 139/77 (!) 155/72  Pulse: 74 80  Resp: 13   Temp: 36.6 C 36.6 C  SpO2: 98% 99%    Last Pain:  Vitals:   01/12/21 1119  TempSrc:   PainSc: 0-No pain                 Catalina Gravel

## 2021-01-12 NOTE — Op Note (Signed)
Preoperative diagnosis: Left proximal ureteral stone Postoperative diagnosis: Same  Procedure: Cystoscopy with left ureteroscopy laser lithotripsy, stone basket extraction, left ureteral stent exchange  Surgeon: Junious Silk  Anesthesia: General  Indication for procedure: Hayley Walters is a 75 year old female with a history of left proximal stone who required staged left ureteroscopy.  She was brought back today after passive ureteral dilation with a stent for definitive stone management.  Findings: On left ureteroscopy stone was located in the left proximal ureter, fragments washed into the kidney.  Remaining large fragments dusted in the upper pole and any significant fragments removed with basket.  Description of procedure: After consent was obtained patient brought to the operating room.  After adequate anesthesia she was placed lithotomy position and prepped and draped in the usual sterile fashion.  Timeout was performed to confirm the patient and procedure.  Cystoscope was passed per urethra and the left ureteral stent grasped and removed through the urethral meatus and a sensor wire advanced to coiled in the collecting system.  She had a red urine but no clots.  The stent was removed.  The stone was visible in the left proximal ureter on fluoroscopy.  Semirigid ureteroscope was advanced up to the stone and 243 m laser fiber advanced and with dust settings the stone was fragmented.  Several fragments washed up toward the renal pelvis and in the process of getting the basket they washed down into the kidney and were visible on fluoroscopy on the lower pole.  Therefore a Glidewire was advanced under direct vision and the semirigid scope backed out.  A short access sheath was passed without difficulty.  Dual channel digital ureteroscope was advanced where the stone was located in the lower pole.  2 smaller fragments were removed intact and the largest fragment was grasped and dropped in the upper pole for ease of  fragmentation.  Here it was dusted.  She had some oozing and a small blood clot collected the rest of the fragments.  The clot was removed and reinspection of the upper calyces noted there to be no stone fragments.  Reinspection of the collecting system and renal pelvis noted there to be no stone fragments.  The access sheath was backed out on the ureteroscope and the renal pelvis and ureter inspected on the way out noted to be without injury and no stone fragment.  The wire was backloaded on the cystoscope and a 6 x 26 cm stent advanced.  The wire was removed with a good coil seen in the upper calyx and a good coil in the bladder.  The bladder was drained and the scope removed.  Urine was clear.  She was awakened taken recovery room in stable condition.  Complications: None  Blood loss: Minimal  Specimens: None  Drains: 6 x 26 cm left ureteral stent with tether  Disposition: Pt stable to PACU - I discussed procedur, post-op care, stent removal ad follow-up with her husband.

## 2021-01-12 NOTE — Anesthesia Preprocedure Evaluation (Addendum)
Anesthesia Evaluation  Patient identified by MRN, date of birth, ID band Patient awake    Reviewed: Allergy & Precautions, NPO status , Patient's Chart, lab work & pertinent test results  Airway Mallampati: II  TM Distance: >3 FB Neck ROM: Full    Dental  (+) Teeth Intact, Dental Advisory Given, Implants,    Pulmonary former smoker, PE Covid 12/21   Pulmonary exam normal breath sounds clear to auscultation       Cardiovascular negative cardio ROS Normal cardiovascular exam Rhythm:Regular Rate:Normal     Neuro/Psych  Headaches, PSYCHIATRIC DISORDERS Depression  Neuromuscular disease    GI/Hepatic negative GI ROS, Neg liver ROS,   Endo/Other  Obesity   Renal/GU LEFT URETERAL STONE     Musculoskeletal  (+) Arthritis ,   Abdominal   Peds  Hematology  (+) Blood dyscrasia (Eliquis; Thrombocytopenia), anemia ,   Anesthesia Other Findings Day of surgery medications reviewed with the patient.  Reproductive/Obstetrics                            Anesthesia Physical Anesthesia Plan  ASA: III  Anesthesia Plan: General   Post-op Pain Management:    Induction: Intravenous  PONV Risk Score and Plan: 4 or greater and Dexamethasone and Ondansetron  Airway Management Planned: LMA  Additional Equipment:   Intra-op Plan:   Post-operative Plan: Extubation in OR  Informed Consent: I have reviewed the patients History and Physical, chart, labs and discussed the procedure including the risks, benefits and alternatives for the proposed anesthesia with the patient or authorized representative who has indicated his/her understanding and acceptance.     Dental advisory given  Plan Discussed with: CRNA  Anesthesia Plan Comments:         Anesthesia Quick Evaluation

## 2021-01-12 NOTE — Discharge Instructions (Signed)
Ureteral Stent Implantation, Care After This sheet gives you information about how to care for yourself after your procedure. Your health care provider may also give you more specific instructions. If you have problems or questions, contact your health care provider.  Removal of the stent: Remove the stent by pulling the string as instructed on Monday morning, 01/15/2021  What can I expect after the procedure? After the procedure, it is common to have:  Nausea.  Mild pain when you urinate. You may feel this pain in your lower back or lower abdomen. The pain should stop within a few minutes after you urinate. This may last for up to 1 week.  A small amount of blood in your urine for several days. Follow these instructions at home: Medicines  Take over-the-counter and prescription medicines only as told by your health care provider.  If you were prescribed an antibiotic medicine, take it as told by your health care provider. Do not stop taking the antibiotic even if you start to feel better.  Do not drive for 24 hours if you were given a sedative during your procedure.  Ask your health care provider if the medicine prescribed to you requires you to avoid driving or using heavy machinery. Activity  Rest as told by your health care provider.  Avoid sitting for a long time without moving. Get up to take short walks every 1-2 hours. This is important to improve blood flow and breathing. Ask for help if you feel weak or unsteady.  Return to your normal activities as told by your health care provider. Ask your health care provider what activities are safe for you. General instructions  Watch for any blood in your urine. Call your health care provider if the amount of blood in your urine increases.  If you have a catheter: ? Follow instructions from your health care provider about taking care of your catheter and collection bag. ? Do not take baths, swim, or use a hot tub until your health  care provider approves. Ask your health care provider if you may take showers. You may only be allowed to take sponge baths.  Drink enough fluid to keep your urine pale yellow.  Do not use any products that contain nicotine or tobacco, such as cigarettes, e-cigarettes, and chewing tobacco. These can delay healing after surgery. If you need help quitting, ask your health care provider.  Keep all follow-up visits as told by your health care provider. This is important.   Contact a health care provider if:  You have pain that gets worse or does not get better with medicine, especially pain when you urinate.  You have difficulty urinating.  You feel nauseous or you vomit repeatedly during a period of more than 2 days after the procedure. Get help right away if:  Your urine is dark red or has blood clots in it.  You are leaking urine (have incontinence).  The end of the stent comes out of your urethra.  You cannot urinate.  You have sudden, sharp, or severe pain in your abdomen or lower back.  You have a fever.  You have swelling or pain in your legs.  You have difficulty breathing. Summary  After the procedure, it is common to have mild pain when you urinate that goes away within a few minutes after you urinate. This may last for up to 1 week.  Watch for any blood in your urine. Call your health care provider if the amount of blood  in your urine increases.  Take over-the-counter and prescription medicines only as told by your health care provider.  Drink enough fluid to keep your urine pale yellow. This information is not intended to replace advice given to you by your health care provider. Make sure you discuss any questions you have with your health care provider. Document Revised: 08/25/2018 Document Reviewed: 08/26/2018 Elsevier Patient Education  2021 [employer].

## 2021-01-13 ENCOUNTER — Encounter (HOSPITAL_COMMUNITY): Payer: Self-pay | Admitting: Urology

## 2021-01-17 DIAGNOSIS — F419 Anxiety disorder, unspecified: Secondary | ICD-10-CM | POA: Diagnosis not present

## 2021-01-17 DIAGNOSIS — Z79899 Other long term (current) drug therapy: Secondary | ICD-10-CM | POA: Diagnosis not present

## 2021-01-17 DIAGNOSIS — Z86718 Personal history of other venous thrombosis and embolism: Secondary | ICD-10-CM | POA: Diagnosis not present

## 2021-01-17 DIAGNOSIS — E559 Vitamin D deficiency, unspecified: Secondary | ICD-10-CM | POA: Diagnosis not present

## 2021-01-17 DIAGNOSIS — M810 Age-related osteoporosis without current pathological fracture: Secondary | ICD-10-CM | POA: Diagnosis not present

## 2021-01-17 DIAGNOSIS — E079 Disorder of thyroid, unspecified: Secondary | ICD-10-CM | POA: Diagnosis not present

## 2021-01-17 DIAGNOSIS — F32A Depression, unspecified: Secondary | ICD-10-CM | POA: Diagnosis not present

## 2021-01-17 DIAGNOSIS — Z86711 Personal history of pulmonary embolism: Secondary | ICD-10-CM | POA: Diagnosis not present

## 2021-01-17 DIAGNOSIS — G43909 Migraine, unspecified, not intractable, without status migrainosus: Secondary | ICD-10-CM | POA: Diagnosis not present

## 2021-01-17 DIAGNOSIS — Z13220 Encounter for screening for lipoid disorders: Secondary | ICD-10-CM | POA: Diagnosis not present

## 2021-01-18 DIAGNOSIS — Z9181 History of falling: Secondary | ICD-10-CM | POA: Diagnosis not present

## 2021-01-18 DIAGNOSIS — Z0000 Encounter for general adult medical examination without abnormal findings: Secondary | ICD-10-CM | POA: Diagnosis not present

## 2021-01-18 DIAGNOSIS — E669 Obesity, unspecified: Secondary | ICD-10-CM | POA: Diagnosis not present

## 2021-01-18 DIAGNOSIS — Z1331 Encounter for screening for depression: Secondary | ICD-10-CM | POA: Diagnosis not present

## 2021-02-22 DIAGNOSIS — N201 Calculus of ureter: Secondary | ICD-10-CM | POA: Diagnosis not present

## 2021-04-17 DIAGNOSIS — Z86718 Personal history of other venous thrombosis and embolism: Secondary | ICD-10-CM | POA: Diagnosis not present

## 2021-04-17 DIAGNOSIS — M810 Age-related osteoporosis without current pathological fracture: Secondary | ICD-10-CM | POA: Diagnosis not present

## 2021-04-17 DIAGNOSIS — Z79899 Other long term (current) drug therapy: Secondary | ICD-10-CM | POA: Diagnosis not present

## 2021-04-17 DIAGNOSIS — E559 Vitamin D deficiency, unspecified: Secondary | ICD-10-CM | POA: Diagnosis not present

## 2021-04-17 DIAGNOSIS — E079 Disorder of thyroid, unspecified: Secondary | ICD-10-CM | POA: Diagnosis not present

## 2021-04-17 DIAGNOSIS — Z1231 Encounter for screening mammogram for malignant neoplasm of breast: Secondary | ICD-10-CM | POA: Diagnosis not present

## 2021-04-17 DIAGNOSIS — F331 Major depressive disorder, recurrent, moderate: Secondary | ICD-10-CM | POA: Diagnosis not present

## 2021-04-17 DIAGNOSIS — F419 Anxiety disorder, unspecified: Secondary | ICD-10-CM | POA: Diagnosis not present

## 2021-04-17 DIAGNOSIS — Z86711 Personal history of pulmonary embolism: Secondary | ICD-10-CM | POA: Diagnosis not present

## 2021-04-17 DIAGNOSIS — G43909 Migraine, unspecified, not intractable, without status migrainosus: Secondary | ICD-10-CM | POA: Diagnosis not present

## 2021-04-23 DIAGNOSIS — Z1231 Encounter for screening mammogram for malignant neoplasm of breast: Secondary | ICD-10-CM | POA: Diagnosis not present

## 2021-04-25 DIAGNOSIS — I2699 Other pulmonary embolism without acute cor pulmonale: Secondary | ICD-10-CM | POA: Diagnosis not present

## 2021-04-25 DIAGNOSIS — Z86711 Personal history of pulmonary embolism: Secondary | ICD-10-CM | POA: Diagnosis not present

## 2021-04-25 DIAGNOSIS — E041 Nontoxic single thyroid nodule: Secondary | ICD-10-CM | POA: Diagnosis not present

## 2021-04-25 DIAGNOSIS — E079 Disorder of thyroid, unspecified: Secondary | ICD-10-CM | POA: Diagnosis not present

## 2021-04-25 DIAGNOSIS — I82411 Acute embolism and thrombosis of right femoral vein: Secondary | ICD-10-CM | POA: Diagnosis not present

## 2021-06-20 DIAGNOSIS — E042 Nontoxic multinodular goiter: Secondary | ICD-10-CM | POA: Diagnosis not present

## 2021-06-21 ENCOUNTER — Other Ambulatory Visit: Payer: Self-pay | Admitting: Surgery

## 2021-06-21 DIAGNOSIS — E041 Nontoxic single thyroid nodule: Secondary | ICD-10-CM

## 2021-06-27 DIAGNOSIS — B9689 Other specified bacterial agents as the cause of diseases classified elsewhere: Secondary | ICD-10-CM | POA: Diagnosis not present

## 2021-06-27 DIAGNOSIS — J208 Acute bronchitis due to other specified organisms: Secondary | ICD-10-CM | POA: Diagnosis not present

## 2021-07-04 DIAGNOSIS — M9903 Segmental and somatic dysfunction of lumbar region: Secondary | ICD-10-CM | POA: Diagnosis not present

## 2021-07-04 DIAGNOSIS — M5432 Sciatica, left side: Secondary | ICD-10-CM | POA: Diagnosis not present

## 2021-07-04 DIAGNOSIS — M5431 Sciatica, right side: Secondary | ICD-10-CM | POA: Diagnosis not present

## 2021-07-04 DIAGNOSIS — M9904 Segmental and somatic dysfunction of sacral region: Secondary | ICD-10-CM | POA: Diagnosis not present

## 2021-07-04 DIAGNOSIS — M9905 Segmental and somatic dysfunction of pelvic region: Secondary | ICD-10-CM | POA: Diagnosis not present

## 2021-07-04 DIAGNOSIS — M5388 Other specified dorsopathies, sacral and sacrococcygeal region: Secondary | ICD-10-CM | POA: Diagnosis not present

## 2021-07-04 DIAGNOSIS — M4727 Other spondylosis with radiculopathy, lumbosacral region: Secondary | ICD-10-CM | POA: Diagnosis not present

## 2021-07-05 ENCOUNTER — Other Ambulatory Visit (HOSPITAL_COMMUNITY)
Admission: RE | Admit: 2021-07-05 | Discharge: 2021-07-05 | Disposition: A | Discharge status: Home / Self Care | Payer: PPO | Source: Ambulatory Visit | Attending: Surgery | Admitting: Surgery

## 2021-07-05 ENCOUNTER — Ambulatory Visit
Admission: RE | Admit: 2021-07-05 | Discharge: 2021-07-05 | Disposition: A | Discharge status: Home / Self Care | Payer: PPO | Source: Ambulatory Visit | Attending: Surgery | Admitting: Surgery

## 2021-07-05 DIAGNOSIS — E041 Nontoxic single thyroid nodule: Secondary | ICD-10-CM

## 2021-07-05 DIAGNOSIS — D34 Benign neoplasm of thyroid gland: Secondary | ICD-10-CM | POA: Insufficient documentation

## 2021-07-10 LAB — CYTOLOGY - NON PAP

## 2021-07-11 NOTE — Progress Notes (Signed)
FNA biopsy of thyroid is benign.  No need for operative intervention at present.  Claiborne Billings - please arrange one year follow up visit with me with USN and TSH prior to [place] [facility] [facility] Office: [phone]

## 2021-07-13 DIAGNOSIS — M545 Low back pain, unspecified: Secondary | ICD-10-CM | POA: Diagnosis not present

## 2021-07-13 DIAGNOSIS — M25551 Pain in right hip: Secondary | ICD-10-CM | POA: Diagnosis not present

## 2021-07-13 DIAGNOSIS — M25552 Pain in left hip: Secondary | ICD-10-CM | POA: Diagnosis not present

## 2021-07-23 DIAGNOSIS — F419 Anxiety disorder, unspecified: Secondary | ICD-10-CM | POA: Diagnosis not present

## 2021-07-23 DIAGNOSIS — E559 Vitamin D deficiency, unspecified: Secondary | ICD-10-CM | POA: Diagnosis not present

## 2021-07-23 DIAGNOSIS — M810 Age-related osteoporosis without current pathological fracture: Secondary | ICD-10-CM | POA: Diagnosis not present

## 2021-07-23 DIAGNOSIS — Z86718 Personal history of other venous thrombosis and embolism: Secondary | ICD-10-CM | POA: Diagnosis not present

## 2021-07-23 DIAGNOSIS — Z86711 Personal history of pulmonary embolism: Secondary | ICD-10-CM | POA: Diagnosis not present

## 2021-07-23 DIAGNOSIS — E041 Nontoxic single thyroid nodule: Secondary | ICD-10-CM | POA: Diagnosis not present

## 2021-07-23 DIAGNOSIS — G43909 Migraine, unspecified, not intractable, without status migrainosus: Secondary | ICD-10-CM | POA: Diagnosis not present

## 2021-07-23 DIAGNOSIS — Z13220 Encounter for screening for lipoid disorders: Secondary | ICD-10-CM | POA: Diagnosis not present

## 2021-07-23 DIAGNOSIS — F331 Major depressive disorder, recurrent, moderate: Secondary | ICD-10-CM | POA: Diagnosis not present

## 2021-07-24 DIAGNOSIS — M5416 Radiculopathy, lumbar region: Secondary | ICD-10-CM | POA: Diagnosis not present

## 2021-07-31 DIAGNOSIS — M25522 Pain in left elbow: Secondary | ICD-10-CM | POA: Diagnosis not present

## 2021-08-02 DIAGNOSIS — M25551 Pain in right hip: Secondary | ICD-10-CM | POA: Diagnosis not present

## 2021-08-03 DIAGNOSIS — M5416 Radiculopathy, lumbar region: Secondary | ICD-10-CM | POA: Diagnosis not present

## 2021-08-30 ENCOUNTER — Other Ambulatory Visit: Payer: Self-pay

## 2021-08-30 ENCOUNTER — Encounter: Payer: Self-pay | Admitting: Family Medicine

## 2021-08-30 ENCOUNTER — Ambulatory Visit (INDEPENDENT_AMBULATORY_CARE_PROVIDER_SITE_OTHER): Payer: PPO | Admitting: Family Medicine

## 2021-08-30 VITALS — Ht 60.0 in | Wt 156.0 lb

## 2021-08-30 DIAGNOSIS — M4316 Spondylolisthesis, lumbar region: Secondary | ICD-10-CM | POA: Diagnosis not present

## 2021-08-30 DIAGNOSIS — M818 Other osteoporosis without current pathological fracture: Secondary | ICD-10-CM

## 2021-08-30 NOTE — Progress Notes (Signed)
OLUWATAMILORE STARNES - 75 y.o. female MRN [id number]  Date of birth: 04-Dec-1945  SUBJECTIVE:  Including CC & ROS.  No chief complaint on file.   Collie NIKEIA HENKES is a 75 y.o. female that is presenting with acute on chronic low back and hip pain.  She also has a longstanding history of osteoporosis with previous stress fractures in the femur with intramedullary rods.  She also has screws placed within the right hip as well.  She reports a previous bone density scan with a T score of -7.  She does have a history of being treated with Prolia but has discontinued that thus far.   Review of Systems See HPI   HISTORY: Past Medical, Surgical, Social, and Family History Reviewed & Updated per EMR.   Pertinent Historical Findings include:  Past Medical History:  Diagnosis Date   Acute carpal tunnel syndrome of right wrist 07/21/2015   Anemia    Arthritis    Cancer (HCC)    Basal cell    Colon polyps    Depression    situational   Fracture of radius, distal, right, closed 07/21/2015   Headache    migraines   History of kidney stones    IBS (irritable bowel syndrome)    Non-alcoholic fatty liver disease    mild   Osteoporosis    Pneumonia    as a child   Pulmonary emboli ([place])     Past Surgical History:  Procedure Laterality Date   ABDOMINAL HYSTERECTOMY     BREAST SURGERY     augmentation   CARPAL TUNNEL RELEASE Right 07/21/2015   Procedure: RIGHT CARPAL TUNNEL RELEASE ;  Surgeon: Marchia Bond, MD;  Location: [place];  Service: Orthopedics;  Laterality: Right;   COLONOSCOPY     CYSTOSCOPY/URETEROSCOPY/HOLMIUM LASER/STENT PLACEMENT Left 12/05/2020   Procedure: CYSTOSCOPY/LEFT RETROGRADE PLYOGRAM/LEFT DIAGNOSTIC URETEROSCOPY AND STENT PLACEMENT;  Surgeon: Festus Aloe, MD;  Location: [facility];  Service: Urology;  Laterality: Left;  ONLY NEEDS 60 MIN   CYSTOSCOPY/URETEROSCOPY/HOLMIUM LASER/STENT PLACEMENT Left 01/12/2021   Procedure: CYSTOSCOPY/RETROGRADE/URETEROSCOPY/HOLMIUM  LASER/STENT PLACEMENT;  Surgeon: Festus Aloe, MD;  Location: [facility];  Service: Urology;  Laterality: Left;  ONLY NEEDS 70 MIN   FRACTURE SURGERY Left    hip/femur   FRACTURE SURGERY Right    screws in right hip for stress fracture   OPEN REDUCTION INTERNAL FIXATION (ORIF) DISTAL RADIAL FRACTURE Right 07/21/2015   Procedure: OPEN REDUCTION INTERNAL FIXATION (ORIF) RIGHT DISTAL RADIAL FRACTURE;  Surgeon: Marchia Bond, MD;  Location: [place];  Service: Orthopedics;  Laterality: Right;   TONSILLECTOMY      Family History  Problem Relation Age of Onset   Congestive Heart Failure Mother    Glaucoma Mother    Macular degeneration Mother    CVA Father     Social History   Socioeconomic History   Marital status: Married    Spouse name: Not on file   Number of children: Not on file   Years of education: Not on file   Highest education level: Not on file  Occupational History   Not on file  Tobacco Use   Smoking status: Former    Types: Cigarettes    Quit date: 07/19/2005    Years since quitting: 16.1   Smokeless tobacco: Never  Vaping Use   Vaping Use: Never used  Substance and Sexual Activity   Alcohol use: Not Currently   Drug use: No   Sexual activity: Not on file  Comment: Hysterectomy  Other Topics Concern   Not on file  Social History Narrative   Not on file   Social Determinants of Health   Financial Resource Strain: Not on file  Food Insecurity: Not on file  Transportation Needs: Not on file  Physical Activity: Not on file  Stress: Not on file  Social Connections: Not on file  Intimate Partner Violence: Not on file     PHYSICAL EXAM:  VS: Ht 5' (1.524 m)   Wt 156 lb (70.8 kg)   BMI 30.47 kg/m  Physical Exam Gen: NAD, alert, cooperative with exam, well-appearing      ASSESSMENT & PLAN:   Spondylolisthesis of lumbar region Degenerative changes of the lumbar region that are acute on chronic in nature.  She does have moderate  spinal stenosis as well.  Has tried epidurals and physical therapy in the past. -Counseled on home exercise therapy and supportive care. -Could consider imaging or physical therapy.  Other osteoporosis without current pathological fracture Acute on chronic in nature.  Reports having a T score of negative separate from years ago.  She has had surgery in each hip from previous stress fractures.  Has been on Prolia in the past.  Has a vitamin D that is up-to-date and in normal range. -Bone density. -Pursue Evenity given history of fractures and previous T score.

## 2021-08-30 NOTE — Assessment & Plan Note (Signed)
Acute on chronic in nature.  Reports having a T score of negative separate from years ago.  She has had surgery in each hip from previous stress fractures.  Has been on Prolia in the past.  Has a vitamin D that is up-to-date and in normal range. -Bone density. -Pursue Evenity given history of fractures and previous T score.

## 2021-08-30 NOTE — Patient Instructions (Signed)
Nice to meet you Please schedule the bone density downstairs. I will call with the results.  Please try the exercises  We'll call when the medication is approved   Please send me a message in MyChart with any questions or updates.  Please see me back in 4 weeks.   --Dr. Raeford Razor

## 2021-08-30 NOTE — Assessment & Plan Note (Signed)
Degenerative changes of the lumbar region that are acute on chronic in nature.  She does have moderate spinal stenosis as well.  Has tried epidurals and physical therapy in the past. -Counseled on home exercise therapy and supportive care. -Could consider imaging or physical therapy.

## 2021-08-31 ENCOUNTER — Other Ambulatory Visit: Payer: Self-pay | Admitting: Family Medicine

## 2021-08-31 MED ORDER — TRAMADOL HCL 50 MG PO TABS: 50.0000 mg | ORAL_TABLET | Freq: Two times a day (BID) | ORAL | 0 refills | Status: DC | PRN

## 2021-08-31 NOTE — Progress Notes (Signed)
Provided tramadol for pain.   Rosemarie Ax, MD [facility] 08/31/2021, 8:05 AM

## 2021-09-04 ENCOUNTER — Telehealth: Payer: Self-pay

## 2021-09-04 NOTE — Telephone Encounter (Signed)
Evenity VOB initiated via [email or web address]  Last OV:  Next OV:  Last Evenity inj: NEW START Next Evenity inj DUE:

## 2021-09-06 ENCOUNTER — Ambulatory Visit (HOSPITAL_BASED_OUTPATIENT_CLINIC_OR_DEPARTMENT_OTHER)
Admission: RE | Admit: 2021-09-06 | Discharge: 2021-09-06 | Disposition: A | Discharge status: Home / Self Care | Payer: PPO | Source: Ambulatory Visit | Attending: Family Medicine | Admitting: Family Medicine

## 2021-09-06 ENCOUNTER — Other Ambulatory Visit: Payer: Self-pay

## 2021-09-06 DIAGNOSIS — M818 Other osteoporosis without current pathological fracture: Secondary | ICD-10-CM

## 2021-09-06 DIAGNOSIS — Z780 Asymptomatic menopausal state: Secondary | ICD-10-CM | POA: Diagnosis not present

## 2021-09-10 ENCOUNTER — Telehealth: Payer: Self-pay | Admitting: Family Medicine

## 2021-09-10 NOTE — Telephone Encounter (Signed)
Left VM for patient. If she calls back please have her speak with a nurse/CMA and inform that her bone density was demonstrating a normal scan.  Questionable as there were several areas that were unable to be scanned due to previous surgery or fracture.  We may not be able to obtain evenity given the normal bone density.   If any questions then please take the best time and phone number to call and I will try to call her back.   Rosemarie Ax, MD [facility] 09/10/2021, 4:55 PM

## 2021-09-13 ENCOUNTER — Other Ambulatory Visit (HOSPITAL_BASED_OUTPATIENT_CLINIC_OR_DEPARTMENT_OTHER): Payer: PPO

## 2021-09-17 NOTE — Telephone Encounter (Signed)
Hold VOB process per Stacey/Dr. Raeford Razor

## 2021-09-27 ENCOUNTER — Ambulatory Visit: Payer: PPO | Admitting: Family Medicine

## 2021-09-27 ENCOUNTER — Encounter: Payer: Self-pay | Admitting: Family Medicine

## 2021-09-27 ENCOUNTER — Ambulatory Visit: Payer: Self-pay

## 2021-09-27 VITALS — BP 160/90 | Ht 60.0 in | Wt 156.0 lb

## 2021-09-27 DIAGNOSIS — M25551 Pain in right hip: Secondary | ICD-10-CM

## 2021-09-27 DIAGNOSIS — M25552 Pain in left hip: Secondary | ICD-10-CM | POA: Diagnosis not present

## 2021-09-27 DIAGNOSIS — M818 Other osteoporosis without current pathological fracture: Secondary | ICD-10-CM | POA: Diagnosis not present

## 2021-09-27 MED ORDER — METHYLPREDNISOLONE ACETATE 40 MG/ML IJ SUSP
40.0000 mg | Freq: Once | INTRAMUSCULAR | Status: AC
Start: 2021-09-27 — End: 2021-09-27
  Administered 2021-09-27: 40 mg via INTRA_ARTICULAR

## 2021-09-27 MED ORDER — METHYLPREDNISOLONE ACETATE 40 MG/ML IJ SUSP
40.0000 mg | Freq: Once | INTRAMUSCULAR | Status: AC
Start: 2021-09-27 — End: 2021-09-27
  Administered 2021-09-27: 40 mg via INTRAMUSCULAR

## 2021-09-27 NOTE — Patient Instructions (Signed)
Good to see you Please use ice as needed  Please try the exercises  I will call with the results from today   Please send me a message in MyChart with any questions or updates.  Please see me back in 4 weeks.   --Dr. Raeford Razor

## 2021-09-27 NOTE — Assessment & Plan Note (Signed)
Bursitis was appreciated on the left as well as having hip abduction weakness.  She does have limited internal rotation on the left as well. -Counseled on home exercise therapy and supportive care. -Bilateral injections today. -Could consider physical therapy or further imaging.

## 2021-09-27 NOTE — Progress Notes (Signed)
Hayley Walters - 75 y.o. female MRN [id number]  Date of birth: Mar 11, 1946  SUBJECTIVE:  Including CC & ROS.  No chief complaint on file.   Hayley Walters is a 75 y.o. female that is presenting with worsening lateral bilateral hip pain.  The pain is burning whatsoever.  She is also bringing in her DEXA scan that was done previously.  This was done 2 years ago which showed a T score of -4.7.    Review of Systems See HPI   HISTORY: Past Medical, Surgical, Social, and Family History Reviewed & Updated per EMR.   Pertinent Historical Findings include:  Past Medical History:  Diagnosis Date   Acute carpal tunnel syndrome of right wrist 07/21/2015   Anemia    Arthritis    Cancer (HCC)    Basal cell    Colon polyps    Depression    situational   Fracture of radius, distal, right, closed 07/21/2015   Headache    migraines   History of kidney stones    IBS (irritable bowel syndrome)    Non-alcoholic fatty liver disease    mild   Osteoporosis    Pneumonia    as a child   Pulmonary emboli ([place])     Past Surgical History:  Procedure Laterality Date   ABDOMINAL HYSTERECTOMY     BREAST SURGERY     augmentation   CARPAL TUNNEL RELEASE Right 07/21/2015   Procedure: RIGHT CARPAL TUNNEL RELEASE ;  Surgeon: Marchia Bond, MD;  Location: [place];  Service: Orthopedics;  Laterality: Right;   COLONOSCOPY     CYSTOSCOPY/URETEROSCOPY/HOLMIUM LASER/STENT PLACEMENT Left 12/05/2020   Procedure: CYSTOSCOPY/LEFT RETROGRADE PLYOGRAM/LEFT DIAGNOSTIC URETEROSCOPY AND STENT PLACEMENT;  Surgeon: Festus Aloe, MD;  [facility];  Service: Urology;  Laterality: Left;  ONLY NEEDS 60 MIN   CYSTOSCOPY/URETEROSCOPY/HOLMIUM LASER/STENT PLACEMENT Left 01/12/2021   Procedure: CYSTOSCOPY/RETROGRADE/URETEROSCOPY/HOLMIUM LASER/STENT PLACEMENT;  Surgeon: Festus Aloe, MD;  [facility];  Service: Urology;  Laterality: Left;  ONLY NEEDS 70 MIN   FRACTURE SURGERY Left    hip/femur    FRACTURE SURGERY Right    screws in right hip for stress fracture   OPEN REDUCTION INTERNAL FIXATION (ORIF) DISTAL RADIAL FRACTURE Right 07/21/2015   Procedure: OPEN REDUCTION INTERNAL FIXATION (ORIF) RIGHT DISTAL RADIAL FRACTURE;  Surgeon: Marchia Bond, MD;  Location: [place];  Service: Orthopedics;  Laterality: Right;   TONSILLECTOMY      Family History  Problem Relation Age of Onset   Congestive Heart Failure Mother    Glaucoma Mother    Macular degeneration Mother    CVA Father     Social History   Socioeconomic History   Marital status: Married    Spouse name: Not on file   Number of children: Not on file   Years of education: Not on file   Highest education level: Not on file  Occupational History   Not on file  Tobacco Use   Smoking status: Former    Types: Cigarettes    Quit date: 07/19/2005    Years since quitting: 16.2   Smokeless tobacco: Never  Vaping Use   Vaping Use: Never used  Substance and Sexual Activity   Alcohol use: Not Currently   Drug use: No   Sexual activity: Not on file    Comment: Hysterectomy  Other Topics Concern   Not on file  Social History Narrative   Not on file   Social Determinants of Health  Financial Resource Strain: Not on file  Food Insecurity: Not on file  Transportation Needs: Not on file  Physical Activity: Not on file  Stress: Not on file  Social Connections: Not on file  Intimate Partner Violence: Not on file     PHYSICAL EXAM:  VS: BP (!) 160/90 (BP Location: Left Arm, Patient Position: Sitting)   Ht 5' (1.524 m)   Wt 156 lb (70.8 kg)   BMI 30.47 kg/m  Physical Exam Gen: NAD, alert, cooperative with exam, well-appearing   Aspiration/Injection Procedure Note Hayley Walters 1946-05-29  Procedure: Injection Indications: Left hip pain  Procedure Details Consent: Risks of procedure as well as the alternatives and risks of each were explained to the (patient/caregiver).  Consent for  procedure obtained. Time Out: Verified patient identification, verified procedure, site/side was marked, verified correct patient position, special equipment/implants available, medications/allergies/relevent history reviewed, required imaging and test results available.  Performed.  The area was cleaned with iodine and alcohol swabs.    The left greater trochanteric bursa was injected using 1 cc's of 40 mg Depo-Medrol and 4 cc's of 0.25% bupivacaine with a 22 3 1/2" needle.  Ultrasound was used. Images were obtained in short views showing the injection.     A sterile dressing was applied.  Patient did tolerate procedure well.  Aspiration/Injection Procedure Note Hayley Walters [birth year]  Procedure: Injection Indications: right hip pain  Procedure Details Consent: Risks of procedure as well as the alternatives and risks of each were explained to the (patient/caregiver).  Consent for procedure obtained. Time Out: Verified patient identification, verified procedure, site/side was marked, verified correct patient position, special equipment/implants available, medications/allergies/relevent history reviewed, required imaging and test results available.  Performed.  The area was cleaned with iodine and alcohol swabs.    The right greater trochanteric bursa was injected using 1 cc's of 40 mg Depo-Medrol and 4 cc's of 0.25% bupivacaine with a 22 3 1/2" needle.  Ultrasound was used. Images were obtained in short views showing the injection.     A sterile dressing was applied.  Patient did tolerate procedure well.   ASSESSMENT & PLAN:   Greater trochanteric pain syndrome of both lower extremities Bursitis was appreciated on the left as well as having hip abduction weakness.  She does have limited internal rotation on the left as well. -Counseled on home exercise therapy and supportive care. -Bilateral injections today. -Could consider physical therapy or further imaging.  Other  osteoporosis without current pathological fracture Her DEXA scan from years ago was demonstrating -4.7 in the forearm.  The DEXA scan just completed did not scan this area to demonstrate the severe T score given her history of fracture in the area.  Her FRAX score of a major osteoporotic event is 60%.  -Counseled on home exercise therapy and supportive care. -CMP, pathology smear, vitamin D, TSH, SPEP. -Would pursue Evenity given no secondary osteoporosis.

## 2021-09-27 NOTE — Assessment & Plan Note (Signed)
Her DEXA scan from years ago was demonstrating -4.7 in the forearm.  The DEXA scan just completed did not scan this area to demonstrate the severe T score given her history of fracture in the area.  Her FRAX score of a major osteoporotic event is 60%.  -Counseled on home exercise therapy and supportive care. -CMP, pathology smear, vitamin D, TSH, SPEP. -Would pursue Evenity given no secondary osteoporosis.

## 2021-10-01 LAB — PATHOLOGIST SMEAR REVIEW
Basophils Absolute: 0 10*3/uL (ref 0.0–0.2)
Basos: 1 %
EOS (ABSOLUTE): 0 10*3/uL (ref 0.0–0.4)
Eos: 0 %
Hematocrit: 35.5 % (ref 34.0–46.6)
Hemoglobin: 11.8 g/dL (ref 11.1–15.9)
Immature Grans (Abs): 0 10*3/uL (ref 0.0–0.1)
Immature Granulocytes: 1 %
Lymphocytes Absolute: 1.3 10*3/uL (ref 0.7–3.1)
Lymphs: 30 %
MCH: 31.9 pg (ref 26.6–33.0)
MCHC: 33.2 g/dL (ref 31.5–35.7)
MCV: 96 fL (ref 79–97)
Monocytes Absolute: 0.6 10*3/uL (ref 0.1–0.9)
Monocytes: 14 %
Neutrophils Absolute: 2.4 10*3/uL (ref 1.4–7.0)
Neutrophils: 54 %
Platelets: 158 10*3/uL (ref 150–450)
RBC: 3.7 x10E6/uL — ABNORMAL LOW (ref 3.77–5.28)
RDW: 12.5 % (ref 11.7–15.4)
WBC: 4.3 10*3/uL (ref 3.4–10.8)

## 2021-10-01 LAB — COMPREHENSIVE METABOLIC PANEL
ALT: 11 IU/L (ref 0–32)
AST: 15 IU/L (ref 0–40)
Albumin/Globulin Ratio: 2.3 — ABNORMAL HIGH (ref 1.2–2.2)
Albumin: 4.4 g/dL (ref 3.7–4.7)
Alkaline Phosphatase: 94 IU/L (ref 44–121)
BUN/Creatinine Ratio: 36 — ABNORMAL HIGH (ref 12–28)
BUN: 28 mg/dL — ABNORMAL HIGH (ref 8–27)
Bilirubin Total: 0.3 mg/dL (ref 0.0–1.2)
CO2: 22 mmol/L (ref 20–29)
Calcium: 9.4 mg/dL (ref 8.7–10.3)
Chloride: 107 mmol/L — ABNORMAL HIGH (ref 96–106)
Creatinine, Ser: 0.77 mg/dL (ref 0.57–1.00)
Globulin, Total: 1.9 g/dL (ref 1.5–4.5)
Glucose: 87 mg/dL (ref 70–99)
Potassium: 4.6 mmol/L (ref 3.5–5.2)
Sodium: 144 mmol/L (ref 134–144)
Total Protein: 6.3 g/dL (ref 6.0–8.5)
eGFR: 81 mL/min/{1.73_m2} (ref >59)

## 2021-10-01 LAB — VITAMIN D 25 HYDROXY (VIT D DEFICIENCY, FRACTURES): Vit D, 25-Hydroxy: 51.2 ng/mL (ref 30.0–100.0)

## 2021-10-01 LAB — PROTEIN ELECTROPHORESIS, SERUM
A/G Ratio: 1.9 — ABNORMAL HIGH (ref 0.7–1.7)
Albumin ELP: 4.1 g/dL (ref 2.9–4.4)
Alpha 1: 0.2 g/dL (ref 0.0–0.4)
Alpha 2: 0.6 g/dL (ref 0.4–1.0)
Beta: 1 g/dL (ref 0.7–1.3)
Gamma Globulin: 0.5 g/dL (ref 0.4–1.8)
Globulin, Total: 2.2 g/dL (ref 2.2–3.9)

## 2021-10-01 LAB — TSH: TSH: 1.75 u[IU]/mL (ref 0.450–4.500)

## 2021-10-03 ENCOUNTER — Telehealth: Payer: Self-pay | Admitting: Family Medicine

## 2021-10-03 NOTE — Telephone Encounter (Signed)
Informed of results.   Rosemarie Ax, MD [facility] 10/03/2021, 1:26 PM

## 2021-10-11 NOTE — Telephone Encounter (Signed)
Pt ready for scheduling on or after 10/11/21  Out-of-pocket cost due at time of visit: $977  Primary: HTA Evenity co-insurance: 50%  Admin fee co-insurance: 0%  Secondary: n/a Evenity co-insurance:  Admin fee co-insurance:   Deductible: does not apply  Prior Auth: not required PA# Valid:    ** This summary of benefits is an estimation of the patient's out-of-pocket cost. Exact cost may vary based on individual plan coverage.

## 2021-10-12 ENCOUNTER — Other Ambulatory Visit: Payer: Self-pay | Admitting: Family Medicine

## 2021-10-15 MED ORDER — TRAMADOL HCL 50 MG PO TABS: 50.0000 mg | ORAL_TABLET | Freq: Two times a day (BID) | ORAL | 0 refills | Status: DC | PRN

## 2021-10-16 NOTE — Telephone Encounter (Signed)
Pt informed of below.  She will follow up with Dr. Raeford Razor as scheduled and discuss this further then. She states she can not afford this and you mentioned "getting help" with the cost. In the meantime, she is going to shop around for other insurance plans.

## 2021-10-25 NOTE — Telephone Encounter (Signed)
Appt with Dr. Raeford Razor 10/29/21.

## 2021-10-29 ENCOUNTER — Ambulatory Visit: Payer: PPO | Admitting: Family Medicine

## 2021-10-29 ENCOUNTER — Encounter: Payer: Self-pay | Admitting: Family Medicine

## 2021-10-29 DIAGNOSIS — M4316 Spondylolisthesis, lumbar region: Secondary | ICD-10-CM

## 2021-10-29 DIAGNOSIS — M818 Other osteoporosis without current pathological fracture: Secondary | ICD-10-CM

## 2021-10-29 DIAGNOSIS — M25551 Pain in right hip: Secondary | ICD-10-CM

## 2021-10-29 DIAGNOSIS — M25552 Pain in left hip: Secondary | ICD-10-CM | POA: Diagnosis not present

## 2021-10-29 NOTE — Assessment & Plan Note (Signed)
Out of pocket for evenity is significant.  - will proceed with prolia

## 2021-10-29 NOTE — Patient Instructions (Signed)
Good to see you We'll check the prolia  Continue the exercises   Please send me a message in MyChart with any questions or updates.  Please see me back in 4-8 weeks.   --Dr. Raeford Razor

## 2021-10-29 NOTE — Assessment & Plan Note (Signed)
Has gotten improvement with injections but pain returning.  - counseled on home exercise therapy and supportive care - shockwave therapy today  - could consider PRP or further imaging.

## 2021-10-29 NOTE — Assessment & Plan Note (Signed)
Acute on chronic in nature.  Has previous MRI of the lumbar spine that shows degenerative changes with facet joints. -Counseled on home exercise therapy and supportive care. -Could consider facet injections.

## 2021-10-29 NOTE — Progress Notes (Signed)
Hayley Walters - 75 y.o. female MRN [id number]  Date of birth: 19-Apr-1946  SUBJECTIVE:  Including CC & ROS.  No chief complaint on file.   Hayley Walters is a 75 y.o. female that is following up for her osteoporosis and bilateral hip pain.  She also has low back pain that is acute on chronic in nature.  Previous MRI of her lumbar spine showing degenerative changes of the facet joints.   Review of Systems See HPI   HISTORY: Past Medical, Surgical, Social, and Family History Reviewed & Updated per EMR.   Pertinent Historical Findings include:  Past Medical History:  Diagnosis Date   Acute carpal tunnel syndrome of right wrist 07/21/2015   Anemia    Arthritis    Cancer (HCC)    Basal cell    Colon polyps    Depression    situational   Fracture of radius, distal, right, closed 07/21/2015   Headache    migraines   History of kidney stones    IBS (irritable bowel syndrome)    Non-alcoholic fatty liver disease    mild   Osteoporosis    Pneumonia    as a child   Pulmonary emboli ([place])     Past Surgical History:  Procedure Laterality Date   ABDOMINAL HYSTERECTOMY     BREAST SURGERY     augmentation   CARPAL TUNNEL RELEASE Right 07/21/2015   Procedure: RIGHT CARPAL TUNNEL RELEASE ;  Surgeon: Marchia Bond, MD;  Location: [place];  Service: Orthopedics;  Laterality: Right;   COLONOSCOPY     CYSTOSCOPY/URETEROSCOPY/HOLMIUM LASER/STENT PLACEMENT Left 12/05/2020   Procedure: CYSTOSCOPY/LEFT RETROGRADE PLYOGRAM/LEFT DIAGNOSTIC URETEROSCOPY AND STENT PLACEMENT;  Surgeon: Festus Aloe, MD;  [facility];  Service: Urology;  Laterality: Left;  ONLY NEEDS 60 MIN   CYSTOSCOPY/URETEROSCOPY/HOLMIUM LASER/STENT PLACEMENT Left 01/12/2021   Procedure: CYSTOSCOPY/RETROGRADE/URETEROSCOPY/HOLMIUM LASER/STENT PLACEMENT;  Surgeon: Festus Aloe, MD;  [facility];  Service: Urology;  Laterality: Left;  ONLY NEEDS 70 MIN   FRACTURE SURGERY Left    hip/femur   FRACTURE  SURGERY Right    screws in right hip for stress fracture   OPEN REDUCTION INTERNAL FIXATION (ORIF) DISTAL RADIAL FRACTURE Right 07/21/2015   Procedure: OPEN REDUCTION INTERNAL FIXATION (ORIF) RIGHT DISTAL RADIAL FRACTURE;  Surgeon: Marchia Bond, MD;  Location: [place];  Service: Orthopedics;  Laterality: Right;   TONSILLECTOMY      Family History  Problem Relation Age of Onset   Congestive Heart Failure Mother    Glaucoma Mother    Macular degeneration Mother    CVA Father     Social History   Socioeconomic History   Marital status: Married    Spouse name: Not on file   Number of children: Not on file   Years of education: Not on file   Highest education level: Not on file  Occupational History   Not on file  Tobacco Use   Smoking status: Former    Types: Cigarettes    Quit date: 07/19/2005    Years since quitting: 16.2   Smokeless tobacco: Never  Vaping Use   Vaping Use: Never used  Substance and Sexual Activity   Alcohol use: Not Currently   Drug use: No   Sexual activity: Not on file    Comment: Hysterectomy  Other Topics Concern   Not on file  Social History Narrative   Not on file   Social Determinants of Health   Financial Resource Strain: Not  on file  Food Insecurity: Not on file  Transportation Needs: Not on file  Physical Activity: Not on file  Stress: Not on file  Social Connections: Not on file  Intimate Partner Violence: Not on file     PHYSICAL EXAM:  VS: BP 140/80 (BP Location: Left Arm, Patient Position: Sitting)   Ht 5' (1.524 m)   Wt 156 lb (70.8 kg)   BMI 30.47 kg/m  Physical Exam Gen: NAD, alert, cooperative with exam, well-appearing   ECSWT Note Hayley Walters [birth year]  Procedure: ECSWT Indications: right hip pain   Procedure Details Consent: Risks of procedure as well as the alternatives and risks of each were explained to the (patient/caregiver).  Consent for procedure obtained. Time Out: Verified patient  identification, verified procedure, site/side was marked, verified correct patient position, special equipment/implants available, medications/allergies/relevent history reviewed, required imaging and test results available.  Performed.  The area was cleaned with iodine and alcohol swabs.    The right hip was targeted for Extracorporeal shockwave therapy.   Preset: Trochanteric bursitis Power Level: 90 Frequency: 10 Impulse/cycles: 2000 Head size: Large Session: First  Patient did tolerate procedure well.  ECSWT Note Hayley Walters [birth year]  Procedure: ECSWT Indications: Left hip pain   Procedure Details Consent: Risks of procedure as well as the alternatives and risks of each were explained to the (patient/caregiver).  Consent for procedure obtained. Time Out: Verified patient identification, verified procedure, site/side was marked, verified correct patient position, special equipment/implants available, medications/allergies/relevent history reviewed, required imaging and test results available.  Performed.  The area was cleaned with iodine and alcohol swabs.    The left hip was targeted for Extracorporeal shockwave therapy.   Preset: Trochanteric bursitis Power Level: 90 Frequency: 10 Impulse/cycles: 2000 Head size: Large Session: First  Patient did tolerate procedure well.   ASSESSMENT & PLAN:   Spondylolisthesis of lumbar region Acute on chronic in nature.  Has previous MRI of the lumbar spine that shows degenerative changes with facet joints. -Counseled on home exercise therapy and supportive care. -Could consider facet injections.  Greater trochanteric pain syndrome of both lower extremities Has gotten improvement with injections but pain returning.  - counseled on home exercise therapy and supportive care - shockwave therapy today  - could consider PRP or further imaging.   Other osteoporosis without current pathological fracture Out of pocket for evenity is  significant.  - will proceed with prolia

## 2021-11-02 ENCOUNTER — Encounter: Payer: Self-pay | Admitting: Family Medicine

## 2021-11-02 ENCOUNTER — Ambulatory Visit: Payer: PPO | Admitting: Family Medicine

## 2021-11-02 DIAGNOSIS — M818 Other osteoporosis without current pathological fracture: Secondary | ICD-10-CM | POA: Diagnosis not present

## 2021-11-02 DIAGNOSIS — M25552 Pain in left hip: Secondary | ICD-10-CM | POA: Diagnosis not present

## 2021-11-02 DIAGNOSIS — M25551 Pain in right hip: Secondary | ICD-10-CM

## 2021-11-02 NOTE — Progress Notes (Signed)
Hayley Walters - 75 y.o. female MRN [id number]  Date of birth: [birth year]  SUBJECTIVE:  Including CC & ROS.  No chief complaint on file.   Hayley Walters is a 75 y.o. female that is following up for her bilateral hip pain.  Pain is still occurring over the lateral aspect with no radicular component.  Has been trying to do the home exercises.   Review of Systems See HPI   HISTORY: Past Medical, Surgical, Social, and Family History Reviewed & Updated per EMR.   Pertinent Historical Findings include:  Past Medical History:  Diagnosis Date   Acute carpal tunnel syndrome of right wrist 07/21/2015   Anemia    Arthritis    Cancer (HCC)    Basal cell    Colon polyps    Depression    situational   Fracture of radius, distal, right, closed 07/21/2015   Headache    migraines   History of kidney stones    IBS (irritable bowel syndrome)    Non-alcoholic fatty liver disease    mild   Osteoporosis    Pneumonia    as a child   Pulmonary emboli ([place])     Past Surgical History:  Procedure Laterality Date   ABDOMINAL HYSTERECTOMY     BREAST SURGERY     augmentation   CARPAL TUNNEL RELEASE Right 07/21/2015   Procedure: RIGHT CARPAL TUNNEL RELEASE ;  Surgeon: Marchia Bond, MD;  Location: [facility];  Service: Orthopedics;  Laterality: Right;   COLONOSCOPY     CYSTOSCOPY/URETEROSCOPY/HOLMIUM LASER/STENT PLACEMENT Left 12/05/2020   Procedure: CYSTOSCOPY/LEFT RETROGRADE PLYOGRAM/LEFT DIAGNOSTIC URETEROSCOPY AND STENT PLACEMENT;  Surgeon: Festus Aloe, MD;  [facility];  Service: Urology;  Laterality: Left;  ONLY NEEDS 60 MIN   CYSTOSCOPY/URETEROSCOPY/HOLMIUM LASER/STENT PLACEMENT Left 01/12/2021   Procedure: CYSTOSCOPY/RETROGRADE/URETEROSCOPY/HOLMIUM LASER/STENT PLACEMENT;  Surgeon: Festus Aloe, MD;  [facility];  Service: Urology;  Laterality: Left;  ONLY NEEDS 70 MIN   FRACTURE SURGERY Left    hip/femur   FRACTURE SURGERY Right    screws in right hip for  stress fracture   OPEN REDUCTION INTERNAL FIXATION (ORIF) DISTAL RADIAL FRACTURE Right 07/21/2015   Procedure: OPEN REDUCTION INTERNAL FIXATION (ORIF) RIGHT DISTAL RADIAL FRACTURE;  Surgeon: Marchia Bond, MD;  Location: [facility];  Service: Orthopedics;  Laterality: Right;   TONSILLECTOMY      Family History  Problem Relation Age of Onset   Congestive Heart Failure Mother    Glaucoma Mother    Macular degeneration Mother    CVA Father     Social History   Socioeconomic History   Marital status: Married    Spouse name: Not on file   Number of children: Not on file   Years of education: Not on file   Highest education level: Not on file  Occupational History   Not on file  Tobacco Use   Smoking status: Former    Types: Cigarettes    Quit date: 07/19/2005    Years since quitting: 16.3   Smokeless tobacco: Never  Vaping Use   Vaping Use: Never used  Substance and Sexual Activity   Alcohol use: Not Currently   Drug use: No   Sexual activity: Not on file    Comment: Hysterectomy  Other Topics Concern   Not on file  Social History Narrative   Not on file   Social Determinants of Health   Financial Resource Strain: Not on file  Food Insecurity: Not on file  Transportation Needs: Not on file  Physical Activity: Not on file  Stress: Not on file  Social Connections: Not on file  Intimate Partner Violence: Not on file     PHYSICAL EXAM:  VS: Ht 5' (1.524 m)   Wt 156 lb (70.8 kg)   BMI 30.47 kg/m  Physical Exam Gen: NAD, alert, cooperative with exam, well-appearing   ECSWT Note Hayley Walters 01-23-1946  Procedure: ECSWT Indications: left hip pain   Procedure Details Consent: Risks of procedure as well as the alternatives and risks of each were explained to the (patient/caregiver).  Consent for procedure obtained. Time Out: Verified patient identification, verified procedure, site/side was marked, verified correct patient position, special  equipment/implants available, medications/allergies/relevent history reviewed, required imaging and test results available.  Performed.  The area was cleaned with iodine and alcohol swabs.    The left lateral hip was targeted for Extracorporeal shockwave therapy.   Preset: Trochanteric bursitis Power Level: 100 Frequency: 10 Impulse/cycles: 2400 Head size: Large Session: Second  Patient did tolerate procedure well.  ECSWT Note Hayley Walters 01-Jan-1946  Procedure: ECSWT Indications: right hip pain   Procedure Details Consent: Risks of procedure as well as the alternatives and risks of each were explained to the (patient/caregiver).  Consent for procedure obtained. Time Out: Verified patient identification, verified procedure, site/side was marked, verified correct patient position, special equipment/implants available, medications/allergies/relevent history reviewed, required imaging and test results available.  Performed.  The area was cleaned with iodine and alcohol swabs.    The right lateral hip was targeted for Extracorporeal shockwave therapy.   Preset: Trochanteric bursitis Power Level: 100 Frequency: 10 Impulse/cycles: 2400 Head size: Large Session: Second  Patient did tolerate procedure well.   ASSESSMENT & PLAN:   Greater trochanteric pain syndrome of both lower extremities Continues to work on her strength to help with her lateral hip pain.  Left hip has different tissue changes given history of type of surgery in the area -Counseled on home exercise therapy and supportive care. -Shockwave therapy.

## 2021-11-02 NOTE — Assessment & Plan Note (Signed)
Continues to work on her strength to help with her lateral hip pain.  Left hip has different tissue changes given history of type of surgery in the area -Counseled on home exercise therapy and supportive care. -Shockwave therapy.

## 2021-11-02 NOTE — Patient Instructions (Signed)
Good to see you  Please send me a message in MyChart with any questions or updates.  Please see me back in one week.   --Dr. Raeford Razor

## 2021-11-07 ENCOUNTER — Encounter: Payer: Self-pay | Admitting: Family Medicine

## 2021-11-07 ENCOUNTER — Ambulatory Visit: Payer: PPO | Admitting: Family Medicine

## 2021-11-07 VITALS — Ht 60.0 in | Wt 156.0 lb

## 2021-11-07 DIAGNOSIS — M25551 Pain in right hip: Secondary | ICD-10-CM | POA: Diagnosis not present

## 2021-11-07 DIAGNOSIS — M25552 Pain in left hip: Secondary | ICD-10-CM | POA: Diagnosis not present

## 2021-11-07 NOTE — Assessment & Plan Note (Signed)
Acute on chronic in nature.  -Counseled on home exercise therapy and supportive care. -Shockwave therapy.

## 2021-11-07 NOTE — Progress Notes (Signed)
Hayley Walters - 75 y.o. female MRN [id number]  Date of birth: [birth year]  SUBJECTIVE:  Including CC & ROS.  No chief complaint on file.   Hayley Walters is a 75 y.o. female that is  here for bilateral hip pain.    Review of Systems See HPI   HISTORY: Past Medical, Surgical, Social, and Family History Reviewed & Updated per EMR.   Pertinent Historical Findings include:  Past Medical History:  Diagnosis Date   Acute carpal tunnel syndrome of right wrist 07/21/2015   Anemia    Arthritis    Cancer (HCC)    Basal cell    Colon polyps    Depression    situational   Fracture of radius, distal, right, closed 07/21/2015   Headache    migraines   History of kidney stones    IBS (irritable bowel syndrome)    Non-alcoholic fatty liver disease    mild   Osteoporosis    Pneumonia    as a child   Pulmonary emboli ([place])     Past Surgical History:  Procedure Laterality Date   ABDOMINAL HYSTERECTOMY     BREAST SURGERY     augmentation   CARPAL TUNNEL RELEASE Right 07/21/2015   Procedure: RIGHT CARPAL TUNNEL RELEASE ;  Surgeon: Marchia Bond, MD;  Location: [facility];  Service: Orthopedics;  Laterality: Right;   COLONOSCOPY     CYSTOSCOPY/URETEROSCOPY/HOLMIUM LASER/STENT PLACEMENT Left 12/05/2020   Procedure: CYSTOSCOPY/LEFT RETROGRADE PLYOGRAM/LEFT DIAGNOSTIC URETEROSCOPY AND STENT PLACEMENT;  Surgeon: Festus Aloe, MD;  [facility];  Service: Urology;  Laterality: Left;  ONLY NEEDS 60 MIN   CYSTOSCOPY/URETEROSCOPY/HOLMIUM LASER/STENT PLACEMENT Left 01/12/2021   Procedure: CYSTOSCOPY/RETROGRADE/URETEROSCOPY/HOLMIUM LASER/STENT PLACEMENT;  Surgeon: Festus Aloe, MD;  [facility];  Service: Urology;  Laterality: Left;  ONLY NEEDS 70 MIN   FRACTURE SURGERY Left    hip/femur   FRACTURE SURGERY Right    screws in right hip for stress fracture   OPEN REDUCTION INTERNAL FIXATION (ORIF) DISTAL RADIAL FRACTURE Right 07/21/2015   Procedure: OPEN REDUCTION  INTERNAL FIXATION (ORIF) RIGHT DISTAL RADIAL FRACTURE;  Surgeon: Marchia Bond, MD;  Location: [facility];  Service: Orthopedics;  Laterality: Right;   TONSILLECTOMY      Family History  Problem Relation Age of Onset   Congestive Heart Failure Mother    Glaucoma Mother    Macular degeneration Mother    CVA Father     Social History   Socioeconomic History   Marital status: Married    Spouse name: Not on file   Number of children: Not on file   Years of education: Not on file   Highest education level: Not on file  Occupational History   Not on file  Tobacco Use   Smoking status: Former    Types: Cigarettes    Quit date: 07/19/2005    Years since quitting: 16.3   Smokeless tobacco: Never  Vaping Use   Vaping Use: Never used  Substance and Sexual Activity   Alcohol use: Not Currently   Drug use: No   Sexual activity: Not on file    Comment: Hysterectomy  Other Topics Concern   Not on file  Social History Narrative   Not on file   Social Determinants of Health   Financial Resource Strain: Not on file  Food Insecurity: Not on file  Transportation Needs: Not on file  Physical Activity: Not on file  Stress: Not on file  Social Connections: Not on  file  Intimate Partner Violence: Not on file     PHYSICAL EXAM:  VS: Ht 5' (1.524 m)   Wt 156 lb (70.8 kg)   BMI 30.47 kg/m  Physical Exam Gen: NAD, alert, cooperative with exam, well-appearing   ECSWT Note Hayley Walters 03-31-1946  Procedure: ECSWT Indications: Left hip pain  Procedure Details Consent: Risks of procedure as well as the alternatives and risks of each were explained to the (patient/caregiver).  Consent for procedure obtained. Time Out: Verified patient identification, verified procedure, site/side was marked, verified correct patient position, special equipment/implants available, medications/allergies/relevent history reviewed, required imaging and test results available.  Performed.   The area was cleaned with iodine and alcohol swabs.    The left hip was targeted for Extracorporeal shockwave therapy.   Preset: Trochanteric bursitis Power Level: 110 Frequency: 10 Impulse/cycles: 2900 Head size: Large Session: Third  Patient did tolerate procedure well.   ECSWT Note Hayley Walters [birth year]  Procedure: ECSWT Indications: Right hip pain  Procedure Details Consent: Risks of procedure as well as the alternatives and risks of each were explained to the (patient/caregiver).  Consent for procedure obtained. Time Out: Verified patient identification, verified procedure, site/side was marked, verified correct patient position, special equipment/implants available, medications/allergies/relevent history reviewed, required imaging and test results available.  Performed.  The area was cleaned with iodine and alcohol swabs.    The Right hip was targeted for Extracorporeal shockwave therapy.   Preset: Trochanteric bursitis Power Level: 110 Frequency: 10 Impulse/cycles: 2900 Head size: Large Session: Third  Patient did tolerate procedure well.   ASSESSMENT & PLAN:   Greater trochanteric pain syndrome of both lower extremities Acute on chronic in nature.  -Counseled on home exercise therapy and supportive care. -Shockwave therapy.

## 2021-11-09 ENCOUNTER — Other Ambulatory Visit: Payer: Self-pay | Admitting: Family Medicine

## 2021-11-09 MED ORDER — TRAMADOL HCL 50 MG PO TABS: 50.0000 mg | ORAL_TABLET | Freq: Two times a day (BID) | ORAL | 0 refills | Status: DC | PRN

## 2021-11-11 ENCOUNTER — Telehealth: Payer: Self-pay

## 2021-11-11 NOTE — Telephone Encounter (Signed)
Per visit note 10/29/21:  Editor: Rosemarie Ax, MD (Physician)              Out of pocket for evenity is significant.  - will proceed with prolia

## 2021-11-11 NOTE — Telephone Encounter (Signed)
Last Prolia inj 11/02/21 Next Prolia inj due 05/04/22

## 2021-11-13 ENCOUNTER — Encounter: Payer: Self-pay | Admitting: Family Medicine

## 2021-11-14 ENCOUNTER — Ambulatory Visit: Payer: PPO | Admitting: Family Medicine

## 2021-11-14 ENCOUNTER — Encounter: Payer: Self-pay | Admitting: Family Medicine

## 2021-11-14 DIAGNOSIS — M25551 Pain in right hip: Secondary | ICD-10-CM

## 2021-11-14 DIAGNOSIS — M25552 Pain in left hip: Secondary | ICD-10-CM | POA: Diagnosis not present

## 2021-11-14 NOTE — Progress Notes (Signed)
Hayley Walters - 75 y.o. female MRN [id number]  Date of birth: 1945-12-21  SUBJECTIVE:  Including CC & ROS.  No chief complaint on file.   Hayley Walters is a 75 y.o. female that is  here for acute on chronic bilateral hip pain.   Review of Systems See HPI   HISTORY: Past Medical, Surgical, Social, and Family History Reviewed & Updated per EMR.   Pertinent Historical Findings include:  Past Medical History:  Diagnosis Date   Acute carpal tunnel syndrome of right wrist 07/21/2015   Anemia    Arthritis    Cancer (HCC)    Basal cell    Colon polyps    Depression    situational   Fracture of radius, distal, right, closed 07/21/2015   Headache    migraines   History of kidney stones    IBS (irritable bowel syndrome)    Non-alcoholic fatty liver disease    mild   Osteoporosis    Pneumonia    as a child   Pulmonary emboli ([place])     Past Surgical History:  Procedure Laterality Date   ABDOMINAL HYSTERECTOMY     BREAST SURGERY     augmentation   CARPAL TUNNEL RELEASE Right 07/21/2015   Procedure: RIGHT CARPAL TUNNEL RELEASE ;  Surgeon: Marchia Bond, MD;  Location: [facility];  Service: Orthopedics;  Laterality: Right;   COLONOSCOPY     CYSTOSCOPY/URETEROSCOPY/HOLMIUM LASER/STENT PLACEMENT Left 12/05/2020   Procedure: CYSTOSCOPY/LEFT RETROGRADE PLYOGRAM/LEFT DIAGNOSTIC URETEROSCOPY AND STENT PLACEMENT;  Surgeon: Festus Aloe, MD;  [facility];  Service: Urology;  Laterality: Left;  ONLY NEEDS 60 MIN   CYSTOSCOPY/URETEROSCOPY/HOLMIUM LASER/STENT PLACEMENT Left 01/12/2021   Procedure: CYSTOSCOPY/RETROGRADE/URETEROSCOPY/HOLMIUM LASER/STENT PLACEMENT;  Surgeon: Festus Aloe, MD;  [facility];  Service: Urology;  Laterality: Left;  ONLY NEEDS 70 MIN   FRACTURE SURGERY Left    hip/femur   FRACTURE SURGERY Right    screws in right hip for stress fracture   OPEN REDUCTION INTERNAL FIXATION (ORIF) DISTAL RADIAL FRACTURE Right 07/21/2015   Procedure: OPEN  REDUCTION INTERNAL FIXATION (ORIF) RIGHT DISTAL RADIAL FRACTURE;  Surgeon: Marchia Bond, MD;  Location: [facility];  Service: Orthopedics;  Laterality: Right;   TONSILLECTOMY      Family History  Problem Relation Age of Onset   Congestive Heart Failure Mother    Glaucoma Mother    Macular degeneration Mother    CVA Father     Social History   Socioeconomic History   Marital status: Married    Spouse name: Not on file   Number of children: Not on file   Years of education: Not on file   Highest education level: Not on file  Occupational History   Not on file  Tobacco Use   Smoking status: Former    Types: Cigarettes    Quit date: 07/19/2005    Years since quitting: 16.3   Smokeless tobacco: Never  Vaping Use   Vaping Use: Never used  Substance and Sexual Activity   Alcohol use: Not Currently   Drug use: No   Sexual activity: Not on file    Comment: Hysterectomy  Other Topics Concern   Not on file  Social History Narrative   Not on file   Social Determinants of Health   Financial Resource Strain: Not on file  Food Insecurity: Not on file  Transportation Needs: Not on file  Physical Activity: Not on file  Stress: Not on file  Social Connections:  Not on file  Intimate Partner Violence: Not on file     PHYSICAL EXAM:  VS: Ht 5' (1.524 m)    Wt 156 lb (70.8 kg)    BMI 30.47 kg/m  Physical Exam Gen: NAD, alert, cooperative with exam, well-appearing   ECSWT Note Hayley Walters [birth year]  Procedure: ECSWT Indications: left hip pain   Procedure Details Consent: Risks of procedure as well as the alternatives and risks of each were explained to the (patient/caregiver).  Consent for procedure obtained. Time Out: Verified patient identification, verified procedure, site/side was marked, verified correct patient position, special equipment/implants available, medications/allergies/relevent history reviewed, required imaging and test results available.   Performed.  The area was cleaned with iodine and alcohol swabs.    The left hip was targeted for Extracorporeal shockwave therapy.   Preset: trochanteric bursitis Power Level: 120 Frequency: 10 Impulse/cycles: 3400 Head size: large  Session: 4th  Patient did tolerate procedure well.  ECSWT Note Hayley Walters 17-Sep-1946  Procedure: ECSWT Indications: right hip pain   Procedure Details Consent: Risks of procedure as well as the alternatives and risks of each were explained to the (patient/caregiver).  Consent for procedure obtained. Time Out: Verified patient identification, verified procedure, site/side was marked, verified correct patient position, special equipment/implants available, medications/allergies/relevent history reviewed, required imaging and test results available.  Performed.  The area was cleaned with iodine and alcohol swabs.    The right hip was targeted for Extracorporeal shockwave therapy.   Preset: trochanteric bursitis Power Level: 120 Frequency: 10 Impulse/cycles: 3400 Head size: large  Session: 4th  Patient did tolerate procedure well.   ASSESSMENT & PLAN:   Greater trochanteric pain syndrome of both lower extremities Has acute worsening of the right hip pain but the left is doing well. -Counseled on home exercise therapy and supportive care. -Shockwave therapy.

## 2021-11-14 NOTE — Assessment & Plan Note (Signed)
Has acute worsening of the right hip pain but the left is doing well. -Counseled on home exercise therapy and supportive care. -Shockwave therapy.

## 2021-11-21 ENCOUNTER — Telehealth: Payer: Self-pay | Admitting: Family Medicine

## 2021-11-21 ENCOUNTER — Ambulatory Visit: Payer: PPO | Admitting: Family Medicine

## 2021-11-21 ENCOUNTER — Encounter: Payer: Self-pay | Admitting: Family Medicine

## 2021-11-21 DIAGNOSIS — M25552 Pain in left hip: Secondary | ICD-10-CM

## 2021-11-21 DIAGNOSIS — M25551 Pain in right hip: Secondary | ICD-10-CM

## 2021-11-21 NOTE — Telephone Encounter (Signed)
Patient came by office says received a Denial from her Health Ins ([employer])  --- Per patient denial can be appeal & for [place] to call Ins Co for process [phone] auth dept spk w/ rep who says clm denied for :  -- Missing the Modifier (TC) and says it can be edited in to a new claim for resubmission & reprocessing .  --Called [place] pt. Accting dept [phone] spk w/ [place] who says will send message to [employer] review dept for review & correction for claim resubmission.  --glh

## 2021-11-21 NOTE — Progress Notes (Signed)
Hayley Walters - 75 y.o. female MRN [id number]  Date of birth: [birth year]  SUBJECTIVE:  Including CC & ROS.  No chief complaint on file.   Hayley Walters is a 75 y.o. female that is  presenting with her bilateral hip pain. Has been doing well. Able to sleep on her side without much pain.   Review of Systems See HPI   HISTORY: Past Medical, Surgical, Social, and Family History Reviewed & Updated per EMR.   Pertinent Historical Findings include:  Past Medical History:  Diagnosis Date   Acute carpal tunnel syndrome of right wrist 07/21/2015   Anemia    Arthritis    Cancer (HCC)    Basal cell    Colon polyps    Depression    situational   Fracture of radius, distal, right, closed 07/21/2015   Headache    migraines   History of kidney stones    IBS (irritable bowel syndrome)    Non-alcoholic fatty liver disease    mild   Osteoporosis    Pneumonia    as a child   Pulmonary emboli ([place])     Past Surgical History:  Procedure Laterality Date   ABDOMINAL HYSTERECTOMY     BREAST SURGERY     augmentation   CARPAL TUNNEL RELEASE Right 07/21/2015   Procedure: RIGHT CARPAL TUNNEL RELEASE ;  Surgeon: Marchia Bond, MD;  Location: [facility];  Service: Orthopedics;  Laterality: Right;   COLONOSCOPY     CYSTOSCOPY/URETEROSCOPY/HOLMIUM LASER/STENT PLACEMENT Left 12/05/2020   Procedure: CYSTOSCOPY/LEFT RETROGRADE PLYOGRAM/LEFT DIAGNOSTIC URETEROSCOPY AND STENT PLACEMENT;  Surgeon: Festus Aloe, MD;  [facility];  Service: Urology;  Laterality: Left;  ONLY NEEDS 60 MIN   CYSTOSCOPY/URETEROSCOPY/HOLMIUM LASER/STENT PLACEMENT Left 01/12/2021   Procedure: CYSTOSCOPY/RETROGRADE/URETEROSCOPY/HOLMIUM LASER/STENT PLACEMENT;  Surgeon: Festus Aloe, MD;  [facility];  Service: Urology;  Laterality: Left;  ONLY NEEDS 70 MIN   FRACTURE SURGERY Left    hip/femur   FRACTURE SURGERY Right    screws in right hip for stress fracture   OPEN REDUCTION INTERNAL FIXATION (ORIF)  DISTAL RADIAL FRACTURE Right 07/21/2015   Procedure: OPEN REDUCTION INTERNAL FIXATION (ORIF) RIGHT DISTAL RADIAL FRACTURE;  Surgeon: Marchia Bond, MD;  Location: [facility];  Service: Orthopedics;  Laterality: Right;   TONSILLECTOMY      Family History  Problem Relation Age of Onset   Congestive Heart Failure Mother    Glaucoma Mother    Macular degeneration Mother    CVA Father     Social History   Socioeconomic History   Marital status: Married    Spouse name: Not on file   Number of children: Not on file   Years of education: Not on file   Highest education level: Not on file  Occupational History   Not on file  Tobacco Use   Smoking status: Former    Types: Cigarettes    Quit date: 07/19/2005    Years since quitting: 16.3   Smokeless tobacco: Never  Vaping Use   Vaping Use: Never used  Substance and Sexual Activity   Alcohol use: Not Currently   Drug use: No   Sexual activity: Not on file    Comment: Hysterectomy  Other Topics Concern   Not on file  Social History Narrative   Not on file   Social Determinants of Health   Financial Resource Strain: Not on file  Food Insecurity: Not on file  Transportation Needs: Not on file  Physical Activity:  Not on file  Stress: Not on file  Social Connections: Not on file  Intimate Partner Violence: Not on file     PHYSICAL EXAM:  VS: Ht 5' (1.524 m)    Wt 156 lb (70.8 kg)    BMI 30.47 kg/m  Physical Exam Gen: NAD, alert, cooperative with exam, well-appearing   ECSWT Note Hayley Walters Apr 15, 1946  Procedure: ECSWT Indications: Right hip pain  Procedure Details Consent: Risks of procedure as well as the alternatives and risks of each were explained to the (patient/caregiver).  Consent for procedure obtained. Time Out: Verified patient identification, verified procedure, site/side was marked, verified correct patient position, special equipment/implants available, medications/allergies/relevent  history reviewed, required imaging and test results available.  Performed.  The area was cleaned with iodine and alcohol swabs.    The right hip was targeted for Extracorporeal shockwave therapy.   Preset: Trochanteric bursitis Power Level: 130 Frequency: 10 Impulse/cycles: 3800 Head size: Large Session: 5th  Patient did tolerate procedure well.  ECSWT Note Hayley Walters [birth year]  Procedure: ECSWT Indications: left hip pain  Procedure Details Consent: Risks of procedure as well as the alternatives and risks of each were explained to the (patient/caregiver).  Consent for procedure obtained. Time Out: Verified patient identification, verified procedure, site/side was marked, verified correct patient position, special equipment/implants available, medications/allergies/relevent history reviewed, required imaging and test results available.  Performed.  The area was cleaned with iodine and alcohol swabs.    The left hip was targeted for Extracorporeal shockwave therapy.   Preset: Trochanteric bursitis Power Level: 130 Frequency: 10 Impulse/cycles: 3800 Head size: Large Session: 5th  Patient did tolerate procedure well.   ASSESSMENT & PLAN:   Greater trochanteric pain syndrome of both lower extremities Has been doing well and still notices pain intermittently. -Counseled on home exercise therapy and supportive care. -Shockwave today. -Could consider physical therapy.

## 2021-11-21 NOTE — Assessment & Plan Note (Signed)
Has been doing well and still notices pain intermittently. -Counseled on home exercise therapy and supportive care. -Shockwave today. -Could consider physical therapy.

## 2021-12-05 ENCOUNTER — Ambulatory Visit: Payer: PPO | Admitting: Family Medicine

## 2021-12-06 ENCOUNTER — Other Ambulatory Visit (HOSPITAL_BASED_OUTPATIENT_CLINIC_OR_DEPARTMENT_OTHER): Payer: PPO

## 2021-12-11 DIAGNOSIS — B029 Zoster without complications: Secondary | ICD-10-CM | POA: Diagnosis not present

## 2021-12-12 ENCOUNTER — Ambulatory Visit (INDEPENDENT_AMBULATORY_CARE_PROVIDER_SITE_OTHER): Payer: Self-pay | Admitting: Family Medicine

## 2021-12-12 ENCOUNTER — Encounter: Payer: Self-pay | Admitting: Family Medicine

## 2021-12-12 DIAGNOSIS — M25552 Pain in left hip: Secondary | ICD-10-CM

## 2021-12-12 DIAGNOSIS — M25551 Pain in right hip: Secondary | ICD-10-CM

## 2021-12-12 NOTE — Progress Notes (Signed)
Hayley Walters - 76 y.o. female MRN [id number]  Date of birth: 04-06-1946  SUBJECTIVE:  Including CC & ROS.  No chief complaint on file.   Hayley Walters is a 76 y.o. female that is presenting with acute on chronic bilateral hip pain.  She has been doing well for the most part.  The pain is intermittent in nature.  Has been doing well with the  shockwave therapy.   Review of Systems See HPI   HISTORY: Past Medical, Surgical, Social, and Family History Reviewed & Updated per EMR.   Pertinent Historical Findings include:  Past Medical History:  Diagnosis Date   Acute carpal tunnel syndrome of right wrist 07/21/2015   Anemia    Arthritis    Cancer (HCC)    Basal cell    Colon polyps    Depression    situational   Fracture of radius, distal, right, closed 07/21/2015   Headache    migraines   History of kidney stones    IBS (irritable bowel syndrome)    Non-alcoholic fatty liver disease    mild   Osteoporosis    Pneumonia    as a child   Pulmonary emboli ([place])     Past Surgical History:  Procedure Laterality Date   ABDOMINAL HYSTERECTOMY     BREAST SURGERY     augmentation   CARPAL TUNNEL RELEASE Right 07/21/2015   Procedure: RIGHT CARPAL TUNNEL RELEASE ;  Surgeon: Marchia Bond, MD;  Location: [facility];  Service: Orthopedics;  Laterality: Right;   COLONOSCOPY     CYSTOSCOPY/URETEROSCOPY/HOLMIUM LASER/STENT PLACEMENT Left 12/05/2020   Procedure: CYSTOSCOPY/LEFT RETROGRADE PLYOGRAM/LEFT DIAGNOSTIC URETEROSCOPY AND STENT PLACEMENT;  Surgeon: Festus Aloe, MD;  [facility];  Service: Urology;  Laterality: Left;  ONLY NEEDS 60 MIN   CYSTOSCOPY/URETEROSCOPY/HOLMIUM LASER/STENT PLACEMENT Left 01/12/2021   Procedure: CYSTOSCOPY/RETROGRADE/URETEROSCOPY/HOLMIUM LASER/STENT PLACEMENT;  Surgeon: Festus Aloe, MD;  [facility];  Service: Urology;  Laterality: Left;  ONLY NEEDS 70 MIN   FRACTURE SURGERY Left    hip/femur   FRACTURE SURGERY Right     screws in right hip for stress fracture   OPEN REDUCTION INTERNAL FIXATION (ORIF) DISTAL RADIAL FRACTURE Right 07/21/2015   Procedure: OPEN REDUCTION INTERNAL FIXATION (ORIF) RIGHT DISTAL RADIAL FRACTURE;  Surgeon: Marchia Bond, MD;  Location: [facility];  Service: Orthopedics;  Laterality: Right;   TONSILLECTOMY       PHYSICAL EXAM:  VS: Ht 5' (1.524 m)    Wt 156 lb (70.8 kg)    BMI 30.47 kg/m  Physical Exam Gen: NAD, alert, cooperative with exam, well-appearing MSK:  Neurovascularly intact    ECSWT Note Hayley Walters [birth year]  Procedure: ECSWT Indications: left hip pain   Procedure Details Consent: Risks of procedure as well as the alternatives and risks of each were explained to the (patient/caregiver).  Consent for procedure obtained. Time Out: Verified patient identification, verified procedure, site/side was marked, verified correct patient position, special equipment/implants available, medications/allergies/relevent history reviewed, required imaging and test results available.  Performed.  The area was cleaned with iodine and alcohol swabs.    The left hip was targeted for Extracorporeal shockwave therapy.   Preset: Trochanteric bursitis Power Level: 140 Frequency: 10 Impulse/cycles: 4000 Head size: Large Session: 6  Patient did tolerate procedure well.  ECSWT Note Hayley Walters [birth year]  Procedure: ECSWT Indications: right hip pain   Procedure Details Consent: Risks of procedure as well as the alternatives and risks of each were  explained to the (patient/caregiver).  Consent for procedure obtained. Time Out: Verified patient identification, verified procedure, site/side was marked, verified correct patient position, special equipment/implants available, medications/allergies/relevent history reviewed, required imaging and test results available.  Performed.  The area was cleaned with iodine and alcohol swabs.    The right hip was targeted for  Extracorporeal shockwave therapy.   Preset: Trochanteric bursitis Power Level: 140 Frequency: 10 Impulse/cycles: 4000 Head size: Large Session: 6  Patient did tolerate procedure well.  ASSESSMENT & PLAN:   Greater trochanteric pain syndrome of both lower extremities Acutely occurring.  Has noticed more pain on the right. -Counseled on home exercise therapy and supportive care. -Shockwave today.

## 2021-12-12 NOTE — Assessment & Plan Note (Signed)
Acutely occurring.  Has noticed more pain on the right. -Counseled on home exercise therapy and supportive care. -Shockwave today.

## 2021-12-19 ENCOUNTER — Encounter: Payer: Self-pay | Admitting: Family Medicine

## 2021-12-19 ENCOUNTER — Ambulatory Visit (INDEPENDENT_AMBULATORY_CARE_PROVIDER_SITE_OTHER): Payer: Self-pay | Admitting: Family Medicine

## 2021-12-19 DIAGNOSIS — M25551 Pain in right hip: Secondary | ICD-10-CM

## 2021-12-19 DIAGNOSIS — M25552 Pain in left hip: Secondary | ICD-10-CM

## 2021-12-19 NOTE — Progress Notes (Signed)
Hayley Walters - 76 y.o. female MRN [id number]  Date of birth: Nov 13, 1946  SUBJECTIVE:  Including CC & ROS.  No chief complaint on file.   Hayley Walters is a 76 y.o. female that is  here for shockwave therapy .    Review of Systems See HPI   HISTORY: Past Medical, Surgical, Social, and Family History Reviewed & Updated per EMR.   Pertinent Historical Findings include:  Past Medical History:  Diagnosis Date   Acute carpal tunnel syndrome of right wrist 07/21/2015   Anemia    Arthritis    Cancer (HCC)    Basal cell    Colon polyps    Depression    situational   Fracture of radius, distal, right, closed 07/21/2015   Headache    migraines   History of kidney stones    IBS (irritable bowel syndrome)    Non-alcoholic fatty liver disease    mild   Osteoporosis    Pneumonia    as a child   Pulmonary emboli ([place])     Past Surgical History:  Procedure Laterality Date   ABDOMINAL HYSTERECTOMY     BREAST SURGERY     augmentation   CARPAL TUNNEL RELEASE Right 07/21/2015   Procedure: RIGHT CARPAL TUNNEL RELEASE ;  Surgeon: Marchia Bond, MD;  Location: [facility];  Service: Orthopedics;  Laterality: Right;   COLONOSCOPY     CYSTOSCOPY/URETEROSCOPY/HOLMIUM LASER/STENT PLACEMENT Left 12/05/2020   Procedure: CYSTOSCOPY/LEFT RETROGRADE PLYOGRAM/LEFT DIAGNOSTIC URETEROSCOPY AND STENT PLACEMENT;  Surgeon: Festus Aloe, MD;  [facility];  Service: Urology;  Laterality: Left;  ONLY NEEDS 60 MIN   CYSTOSCOPY/URETEROSCOPY/HOLMIUM LASER/STENT PLACEMENT Left 01/12/2021   Procedure: CYSTOSCOPY/RETROGRADE/URETEROSCOPY/HOLMIUM LASER/STENT PLACEMENT;  Surgeon: Festus Aloe, MD;  [facility];  Service: Urology;  Laterality: Left;  ONLY NEEDS 70 MIN   FRACTURE SURGERY Left    hip/femur   FRACTURE SURGERY Right    screws in right hip for stress fracture   OPEN REDUCTION INTERNAL FIXATION (ORIF) DISTAL RADIAL FRACTURE Right 07/21/2015   Procedure: OPEN REDUCTION  INTERNAL FIXATION (ORIF) RIGHT DISTAL RADIAL FRACTURE;  Surgeon: Marchia Bond, MD;  Location: [facility];  Service: Orthopedics;  Laterality: Right;   TONSILLECTOMY       PHYSICAL EXAM:  VS: Ht 5' (1.524 m)    Wt 156 lb (70.8 kg)    BMI 30.47 kg/m  Physical Exam Gen: NAD, alert, cooperative with exam, well-appearing MSK:  Neurovascularly intact    ECSWT Note Hayley Walters 1946-05-10  Procedure: ECSWT Indications: right hip pain   Procedure Details Consent: Risks of procedure as well as the alternatives and risks of each were explained to the (patient/caregiver).  Consent for procedure obtained. Time Out: Verified patient identification, verified procedure, site/side was marked, verified correct patient position, special equipment/implants available, medications/allergies/relevent history reviewed, required imaging and test results available.  Performed.  The area was cleaned with iodine and alcohol swabs.    The right hip was targeted for Extracorporeal shockwave therapy.   Preset: Trochanteric bursitis Power Level: 130 Frequency: 10 Impulse/cycles: 4300 Head size: Large Session: 7th  Patient did tolerate procedure well.  ECSWT Note Hayley Walters Jun 30, 1946  Procedure: ECSWT Indications: left hip pain   Procedure Details Consent: Risks of procedure as well as the alternatives and risks of each were explained to the (patient/caregiver).  Consent for procedure obtained. Time Out: Verified patient identification, verified procedure, site/side was marked, verified correct patient position, special equipment/implants available, medications/allergies/relevent history  reviewed, required imaging and test results available.  Performed.  The area was cleaned with iodine and alcohol swabs.    The left hip was targeted for Extracorporeal shockwave therapy.   Preset: Trochanteric bursitis Power Level: 130 Frequency: 10 Impulse/cycles: 4300 Head size: Large Session:  7th  Patient did tolerate procedure well.  ASSESSMENT & PLAN:   Greater trochanteric pain syndrome of both lower extremities Completed shockwave therapy today.

## 2021-12-19 NOTE — Assessment & Plan Note (Signed)
Completed shockwave therapy today.  

## 2021-12-25 ENCOUNTER — Other Ambulatory Visit: Payer: Self-pay | Admitting: Family Medicine

## 2021-12-25 MED ORDER — TRAMADOL HCL 50 MG PO TABS: 50.0000 mg | ORAL_TABLET | Freq: Two times a day (BID) | ORAL | 0 refills | Status: DC | PRN

## 2021-12-26 ENCOUNTER — Encounter: Payer: Self-pay | Admitting: Family Medicine

## 2021-12-26 ENCOUNTER — Ambulatory Visit (INDEPENDENT_AMBULATORY_CARE_PROVIDER_SITE_OTHER): Payer: Self-pay | Admitting: Family Medicine

## 2021-12-26 DIAGNOSIS — M25551 Pain in right hip: Secondary | ICD-10-CM

## 2021-12-26 DIAGNOSIS — M25552 Pain in left hip: Secondary | ICD-10-CM

## 2021-12-26 DIAGNOSIS — M4316 Spondylolisthesis, lumbar region: Secondary | ICD-10-CM

## 2021-12-26 NOTE — Progress Notes (Signed)
Hayley Walters - 76 y.o. female MRN [id number]  Date of birth: Oct 07, 1946  SUBJECTIVE:  Including CC & ROS.  No chief complaint on file.   Hayley Walters is a 76 y.o. female that is here for shockwave therapy.  Is presenting with acute on chronic low back pain.   Review of Systems See HPI   HISTORY: Past Medical, Surgical, Social, and Family History Reviewed & Updated per EMR.   Pertinent Historical Findings include:  Past Medical History:  Diagnosis Date   Acute carpal tunnel syndrome of right wrist 07/21/2015   Anemia    Arthritis    Cancer (HCC)    Basal cell    Colon polyps    Depression    situational   Fracture of radius, distal, right, closed 07/21/2015   Headache    migraines   History of kidney stones    IBS (irritable bowel syndrome)    Non-alcoholic fatty liver disease    mild   Osteoporosis    Pneumonia    as a child   Pulmonary emboli ([place])     Past Surgical History:  Procedure Laterality Date   ABDOMINAL HYSTERECTOMY     BREAST SURGERY     augmentation   CARPAL TUNNEL RELEASE Right 07/21/2015   Procedure: RIGHT CARPAL TUNNEL RELEASE ;  Surgeon: Marchia Bond, MD;  Location: [facility];  Service: Orthopedics;  Laterality: Right;   COLONOSCOPY     CYSTOSCOPY/URETEROSCOPY/HOLMIUM LASER/STENT PLACEMENT Left 12/05/2020   Procedure: CYSTOSCOPY/LEFT RETROGRADE PLYOGRAM/LEFT DIAGNOSTIC URETEROSCOPY AND STENT PLACEMENT;  Surgeon: Festus Aloe, MD;  [facility];  Service: Urology;  Laterality: Left;  ONLY NEEDS 60 MIN   CYSTOSCOPY/URETEROSCOPY/HOLMIUM LASER/STENT PLACEMENT Left 01/12/2021   Procedure: CYSTOSCOPY/RETROGRADE/URETEROSCOPY/HOLMIUM LASER/STENT PLACEMENT;  Surgeon: Festus Aloe, MD;  [facility];  Service: Urology;  Laterality: Left;  ONLY NEEDS 70 MIN   FRACTURE SURGERY Left    hip/femur   FRACTURE SURGERY Right    screws in right hip for stress fracture   OPEN REDUCTION INTERNAL FIXATION (ORIF) DISTAL RADIAL FRACTURE  Right 07/21/2015   Procedure: OPEN REDUCTION INTERNAL FIXATION (ORIF) RIGHT DISTAL RADIAL FRACTURE;  Surgeon: Marchia Bond, MD;  Location: [facility];  Service: Orthopedics;  Laterality: Right;   TONSILLECTOMY       PHYSICAL EXAM:  VS: Ht 5' (1.524 m)    Wt 156 lb (70.8 kg)    BMI 30.47 kg/m  Physical Exam Gen: NAD, alert, cooperative with exam, well-appearing MSK:  Neurovascularly intact    ECSWT Note TYSHAWN KEEL October 05, 1946  Procedure: ECSWT Indications: right hip pain   Procedure Details Consent: Risks of procedure as well as the alternatives and risks of each were explained to the (patient/caregiver).  Consent for procedure obtained. Time Out: Verified patient identification, verified procedure, site/side was marked, verified correct patient position, special equipment/implants available, medications/allergies/relevent history reviewed, required imaging and test results available.  Performed.  The area was cleaned with iodine and alcohol swabs.    The right hip was targeted for Extracorporeal shockwave therapy.   Preset: Trochanteric bursitis Power Level: 140 Frequency: 10 Impulse/cycles: 4500 Head size: large  Session: 8th  Patient did tolerate procedure well.  ECSWT Note BRANDILEE PIES 1946/08/15  Procedure: ECSWT Indications: left hip pain   Procedure Details Consent: Risks of procedure as well as the alternatives and risks of each were explained to the (patient/caregiver).  Consent for procedure obtained. Time Out: Verified patient identification, verified procedure, site/side was marked, verified  correct patient position, special equipment/implants available, medications/allergies/relevent history reviewed, required imaging and test results available.  Performed.  The area was cleaned with iodine and alcohol swabs.    The left hip was targeted for Extracorporeal shockwave therapy.   Preset: Trochanteric bursitis Power Level: 140 Frequency:  10 Impulse/cycles: 4500 Head size: large  Session: 8th  Patient did tolerate procedure well.  ASSESSMENT & PLAN:   Greater trochanteric pain syndrome of both lower extremities Completed shockwave therapy.  Spondylolisthesis of lumbar region Has facet changes as well as some mild slip that is likely contributing to her acute on chronic low back pain.  Most recent MRI was August 2022. -Counseled on home exercise therapy and supportive care. -Could consider physical therapy or facet injections.

## 2021-12-26 NOTE — Assessment & Plan Note (Signed)
Has facet changes as well as some mild slip that is likely contributing to her acute on chronic low back pain.  Most recent MRI was August 2022. -Counseled on home exercise therapy and supportive care. -Could consider physical therapy or facet injections.

## 2021-12-26 NOTE — Assessment & Plan Note (Signed)
Completed shockwave therapy  

## 2022-01-09 ENCOUNTER — Ambulatory Visit (INDEPENDENT_AMBULATORY_CARE_PROVIDER_SITE_OTHER): Payer: Self-pay | Admitting: Family Medicine

## 2022-01-09 ENCOUNTER — Encounter: Payer: Self-pay | Admitting: Family Medicine

## 2022-01-09 DIAGNOSIS — M25551 Pain in right hip: Secondary | ICD-10-CM

## 2022-01-09 DIAGNOSIS — M25552 Pain in left hip: Secondary | ICD-10-CM

## 2022-01-09 NOTE — Progress Notes (Signed)
SANDRINE BLOODSWORTH - 76 y.o. female MRN [id number]  Date of birth: 08-04-1946  SUBJECTIVE:  Including CC & ROS.  No chief complaint on file.   Ronne CELICIA MINAHAN is a 76 y.o. female that is here for shockwave therapy.   Review of Systems See HPI   HISTORY: Past Medical, Surgical, Social, and Family History Reviewed & Updated per EMR.   Pertinent Historical Findings include:  Past Medical History:  Diagnosis Date   Acute carpal tunnel syndrome of right wrist 07/21/2015   Anemia    Arthritis    Cancer (HCC)    Basal cell    Colon polyps    Depression    situational   Fracture of radius, distal, right, closed 07/21/2015   Headache    migraines   History of kidney stones    IBS (irritable bowel syndrome)    Non-alcoholic fatty liver disease    mild   Osteoporosis    Pneumonia    as a child   Pulmonary emboli ([place])     Past Surgical History:  Procedure Laterality Date   ABDOMINAL HYSTERECTOMY     BREAST SURGERY     augmentation   CARPAL TUNNEL RELEASE Right 07/21/2015   Procedure: RIGHT CARPAL TUNNEL RELEASE ;  Surgeon: Marchia Bond, MD;  Location: [facility];  Service: Orthopedics;  Laterality: Right;   COLONOSCOPY     CYSTOSCOPY/URETEROSCOPY/HOLMIUM LASER/STENT PLACEMENT Left 12/05/2020   Procedure: CYSTOSCOPY/LEFT RETROGRADE PLYOGRAM/LEFT DIAGNOSTIC URETEROSCOPY AND STENT PLACEMENT;  Surgeon: Festus Aloe, MD;  [facility];  Service: Urology;  Laterality: Left;  ONLY NEEDS 60 MIN   CYSTOSCOPY/URETEROSCOPY/HOLMIUM LASER/STENT PLACEMENT Left 01/12/2021   Procedure: CYSTOSCOPY/RETROGRADE/URETEROSCOPY/HOLMIUM LASER/STENT PLACEMENT;  Surgeon: Festus Aloe, MD;  [facility];  Service: Urology;  Laterality: Left;  ONLY NEEDS 70 MIN   FRACTURE SURGERY Left    hip/femur   FRACTURE SURGERY Right    screws in right hip for stress fracture   OPEN REDUCTION INTERNAL FIXATION (ORIF) DISTAL RADIAL FRACTURE Right 07/21/2015   Procedure: OPEN REDUCTION INTERNAL  FIXATION (ORIF) RIGHT DISTAL RADIAL FRACTURE;  Surgeon: Marchia Bond, MD;  Location: [facility];  Service: Orthopedics;  Laterality: Right;   TONSILLECTOMY       PHYSICAL EXAM:  VS: Ht 5' (1.524 m)    Wt 156 lb (70.8 kg)    BMI 30.47 kg/m  Physical Exam Gen: NAD, alert, cooperative with exam, well-appearing MSK:  Neurovascularly intact    ECSWT Note MABREY HOWLAND March 20, 1946  Procedure: ECSWT Indications: right hip pain  Procedure Details Consent: Risks of procedure as well as the alternatives and risks of each were explained to the (patient/caregiver).  Consent for procedure obtained. Time Out: Verified patient identification, verified procedure, site/side was marked, verified correct patient position, special equipment/implants available, medications/allergies/relevent history reviewed, required imaging and test results available.  Performed.  The area was cleaned with iodine and alcohol swabs.    The right hip was targeted for Extracorporeal shockwave therapy.   Preset: Trochanteric bursitis Power Level: 120 Frequency: 10 Impulse/cycles: 3500 Head size: 9th  Session: Large  Patient did tolerate procedure well.  ECSWT Note CAREY LAFON [birth year]  Procedure: ECSWT Indications: left hip pain  Procedure Details Consent: Risks of procedure as well as the alternatives and risks of each were explained to the (patient/caregiver).  Consent for procedure obtained. Time Out: Verified patient identification, verified procedure, site/side was marked, verified correct patient position, special equipment/implants available, medications/allergies/relevent history reviewed, required imaging and  test results available.  Performed.  The area was cleaned with iodine and alcohol swabs.    The left hip was targeted for Extracorporeal shockwave therapy.   Preset: Trochanteric bursitis Power Level: 120 Frequency: 10 Impulse/cycles: 3500 Head size: 9th  Session:  Large  Patient did tolerate procedure well.  ASSESSMENT & PLAN:   Greater trochanteric pain syndrome of both lower extremities Completed shockwave therapy

## 2022-01-09 NOTE — Assessment & Plan Note (Signed)
Completed shockwave therapy  

## 2022-01-16 ENCOUNTER — Encounter: Payer: Self-pay | Admitting: Family Medicine

## 2022-01-16 ENCOUNTER — Telehealth: Payer: Self-pay | Admitting: Family Medicine

## 2022-01-16 ENCOUNTER — Ambulatory Visit (INDEPENDENT_AMBULATORY_CARE_PROVIDER_SITE_OTHER): Payer: Self-pay | Admitting: Family Medicine

## 2022-01-16 DIAGNOSIS — M25552 Pain in left hip: Secondary | ICD-10-CM

## 2022-01-16 DIAGNOSIS — M25551 Pain in right hip: Secondary | ICD-10-CM

## 2022-01-16 MED ORDER — TRIAMCINOLONE ACETONIDE 0.1 % EX OINT: 1.0000 "application " | TOPICAL_OINTMENT | Freq: Two times a day (BID) | CUTANEOUS | 6 refills | Status: DC

## 2022-01-16 NOTE — Assessment & Plan Note (Signed)
Completed shockwave therapy  

## 2022-01-16 NOTE — Telephone Encounter (Signed)
Pt stopped on way out from [place] says forgot to ask for Rx for Eczema .  --Forwarding request to med asst for review w/ provider, if approved send Rx order to :  [facility], [place] [address] Phone:  [phone]  Fax:  [phone]      -- Please call pt if there are any questions.  -glh

## 2022-01-16 NOTE — Progress Notes (Signed)
Hayley Walters - 76 y.o. female MRN [id number]  Date of birth: [birth year]  SUBJECTIVE:  Including CC & ROS.  No chief complaint on file.   Hayley Walters is a 76 y.o. female that is  here for shockwave therapy.    Review of Systems See HPI   HISTORY: Past Medical, Surgical, Social, and Family History Reviewed & Updated per EMR.   Pertinent Historical Findings include:  Past Medical History:  Diagnosis Date   Acute carpal tunnel syndrome of right wrist 07/21/2015   Anemia    Arthritis    Cancer (HCC)    Basal cell    Colon polyps    Depression    situational   Fracture of radius, distal, right, closed 07/21/2015   Headache    migraines   History of kidney stones    IBS (irritable bowel syndrome)    Non-alcoholic fatty liver disease    mild   Osteoporosis    Pneumonia    as a child   Pulmonary emboli ([place])     Past Surgical History:  Procedure Laterality Date   ABDOMINAL HYSTERECTOMY     BREAST SURGERY     augmentation   CARPAL TUNNEL RELEASE Right 07/21/2015   Procedure: RIGHT CARPAL TUNNEL RELEASE ;  Surgeon: Marchia Bond, MD;  Location: [place];  Service: Orthopedics;  Laterality: Right;   COLONOSCOPY     CYSTOSCOPY/URETEROSCOPY/HOLMIUM LASER/STENT PLACEMENT Left 12/05/2020   Procedure: CYSTOSCOPY/LEFT RETROGRADE PLYOGRAM/LEFT DIAGNOSTIC URETEROSCOPY AND STENT PLACEMENT;  Surgeon: Festus Aloe, MD;  [facility];  Service: Urology;  Laterality: Left;  ONLY NEEDS 60 MIN   CYSTOSCOPY/URETEROSCOPY/HOLMIUM LASER/STENT PLACEMENT Left 01/12/2021   Procedure: CYSTOSCOPY/RETROGRADE/URETEROSCOPY/HOLMIUM LASER/STENT PLACEMENT;  Surgeon: Festus Aloe, MD;  [facility];  Service: Urology;  Laterality: Left;  ONLY NEEDS 70 MIN   FRACTURE SURGERY Left    hip/femur   FRACTURE SURGERY Right    screws in right hip for stress fracture   OPEN REDUCTION INTERNAL FIXATION (ORIF) DISTAL RADIAL FRACTURE Right 07/21/2015   Procedure: OPEN REDUCTION  INTERNAL FIXATION (ORIF) RIGHT DISTAL RADIAL FRACTURE;  Surgeon: Marchia Bond, MD;  Location: [place];  Service: Orthopedics;  Laterality: Right;   TONSILLECTOMY       PHYSICAL EXAM:  VS: Ht 5' (1.524 m)    Wt 156 lb (70.8 kg)    BMI 30.47 kg/m  Physical Exam Gen: NAD, alert, cooperative with exam, well-appearing MSK:  Neurovascularly intact    ECSWT Note TORIANA SPONSEL 08-02-1946  Procedure: ECSWT Indications: right hip pain   Procedure Details Consent: Risks of procedure as well as the alternatives and risks of each were explained to the (patient/caregiver).  Consent for procedure obtained. Time Out: Verified patient identification, verified procedure, site/side was marked, verified correct patient position, special equipment/implants available, medications/allergies/relevent history reviewed, required imaging and test results available.  Performed.  The area was cleaned with iodine and alcohol swabs.    The right hip was targeted for Extracorporeal shockwave therapy.   Preset: Trochanteric bursitis Power Level: 120 Frequency: 10 Impulse/cycles: 3500 Head size: Large Session: 10th  Patient did tolerate procedure well.  ECSWT Note MARGUERETTE SHELLER 12-12-1945  Procedure: ECSWT Indications: left hip pain   Procedure Details Consent: Risks of procedure as well as the alternatives and risks of each were explained to the (patient/caregiver).  Consent for procedure obtained. Time Out: Verified patient identification, verified procedure, site/side was marked, verified correct patient position, special equipment/implants available, medications/allergies/relevent history reviewed,  required imaging and test results available.  Performed.  The area was cleaned with iodine and alcohol swabs.    The left hip was targeted for Extracorporeal shockwave therapy.   Preset: Trochanteric bursitis Power Level: 120 Frequency: 10 Impulse/cycles: 3500 Head size:  Large Session: 10th  Patient did tolerate procedure well.   ASSESSMENT & PLAN:   Greater trochanteric pain syndrome of both lower extremities Completed shockwave therapy

## 2022-01-30 ENCOUNTER — Ambulatory Visit: Payer: PPO | Admitting: Family Medicine

## 2022-02-01 ENCOUNTER — Other Ambulatory Visit: Payer: Self-pay | Admitting: Family Medicine

## 2022-02-01 DIAGNOSIS — E669 Obesity, unspecified: Secondary | ICD-10-CM | POA: Diagnosis not present

## 2022-02-01 DIAGNOSIS — Z139 Encounter for screening, unspecified: Secondary | ICD-10-CM | POA: Diagnosis not present

## 2022-02-01 DIAGNOSIS — E785 Hyperlipidemia, unspecified: Secondary | ICD-10-CM | POA: Diagnosis not present

## 2022-02-01 DIAGNOSIS — Z0000 Encounter for general adult medical examination without abnormal findings: Secondary | ICD-10-CM | POA: Diagnosis not present

## 2022-02-01 DIAGNOSIS — Z6830 Body mass index (BMI) 30.0-30.9, adult: Secondary | ICD-10-CM | POA: Diagnosis not present

## 2022-02-01 DIAGNOSIS — Z1331 Encounter for screening for depression: Secondary | ICD-10-CM | POA: Diagnosis not present

## 2022-02-01 DIAGNOSIS — Z9181 History of falling: Secondary | ICD-10-CM | POA: Diagnosis not present

## 2022-02-06 ENCOUNTER — Ambulatory Visit (INDEPENDENT_AMBULATORY_CARE_PROVIDER_SITE_OTHER): Payer: Self-pay | Admitting: Family Medicine

## 2022-02-06 ENCOUNTER — Encounter: Payer: Self-pay | Admitting: Family Medicine

## 2022-02-06 DIAGNOSIS — M25551 Pain in right hip: Secondary | ICD-10-CM

## 2022-02-06 DIAGNOSIS — M25552 Pain in left hip: Secondary | ICD-10-CM

## 2022-02-06 NOTE — Assessment & Plan Note (Signed)
Completed shockwave therapy  

## 2022-02-06 NOTE — Progress Notes (Signed)
?Hayley Walters - 76 y.o. female MRN [id number]  Date of birth: 25-Sep-1946 ? ?SUBJECTIVE:  Including CC & ROS.  ?No chief complaint on file. ? ? ?Hayley Walters is a 76 y.o. female that is here for shockwave therapy. ? ? ?Review of Systems ?See HPI  ? ?HISTORY: Past Medical, Surgical, Social, and Family History Reviewed & Updated per EMR.   ?Pertinent Historical Findings include: ? ?Past Medical History:  ?Diagnosis Date  ? Acute carpal tunnel syndrome of right wrist 07/21/2015  ? Anemia   ? Arthritis   ? Cancer [facility])   ? Basal cell   ? Colon polyps   ? Depression   ? situational  ? Fracture of radius, distal, right, closed 07/21/2015  ? Headache   ? migraines  ? History of kidney stones   ? IBS (irritable bowel syndrome)   ? Non-alcoholic fatty liver disease   ? mild  ? Osteoporosis   ? Pneumonia   ? as a child  ? Pulmonary emboli ([place])   ? ? ?Past Surgical History:  ?Procedure Laterality Date  ? ABDOMINAL HYSTERECTOMY    ? BREAST SURGERY    ? augmentation  ? CARPAL TUNNEL RELEASE Right 07/21/2015  ? Procedure: RIGHT CARPAL TUNNEL RELEASE ;  Surgeon: Marchia Bond, MD;  Location: [place];  Service: Orthopedics;  Laterality: Right;  ? COLONOSCOPY    ? CYSTOSCOPY/URETEROSCOPY/HOLMIUM LASER/STENT PLACEMENT Left 12/05/2020  ? Procedure: CYSTOSCOPY/LEFT RETROGRADE PLYOGRAM/LEFT DIAGNOSTIC URETEROSCOPY AND STENT PLACEMENT;  Surgeon: Festus Aloe, MD;  [facility];  Service: Urology;  Laterality: Left;  ONLY NEEDS 60 MIN  ? CYSTOSCOPY/URETEROSCOPY/HOLMIUM LASER/STENT PLACEMENT Left 01/12/2021  ? Procedure: CHYIFOYDXA/JOINOMVEHM/CNOBSJGGEZMO/QHUTMLY LASER/STENT PLACEMENT;  Surgeon: Festus Aloe, MD;  [facility];  Service: Urology;  Laterality: Left;  ONLY NEEDS 70 MIN  ? FRACTURE SURGERY Left   ? hip/femur  ? FRACTURE SURGERY Right   ? screws in right hip for stress fracture  ? OPEN REDUCTION INTERNAL FIXATION (ORIF) DISTAL RADIAL FRACTURE Right 07/21/2015  ? Procedure: OPEN REDUCTION INTERNAL  FIXATION (ORIF) RIGHT DISTAL RADIAL FRACTURE;  Surgeon: Marchia Bond, MD;  Location: [place];  Service: Orthopedics;  Laterality: Right;  ? TONSILLECTOMY    ? ? ? ?PHYSICAL EXAM:  ?VS: Ht 5' (1.524 m)   Wt 156 lb (70.8 kg)   BMI 30.47 kg/m?  ?Physical Exam ?Gen: NAD, alert, cooperative with exam, well-appearing ?MSK:  ?Neurovascularly intact   ? ?ECSWT Note ?Hayley Walters ?1946/03/29 ? ?Procedure: ECSWT ?Indications: right hip pain  ? ?Procedure Details ?Consent: Risks of procedure as well as the alternatives and risks of each were explained to the (patient/caregiver).  Consent for procedure obtained. ?Time Out: Verified patient identification, verified procedure, site/side was marked, verified correct patient position, special equipment/implants available, medications/allergies/relevent history reviewed, required imaging and test results available.  Performed.  The area was cleaned with iodine and alcohol swabs.   ? ?The right hip was targeted for Extracorporeal shockwave therapy.  ? ?Preset: Trochanteric bursitis ?Power Level: 120 ?Frequency: 10 ?Impulse/cycles: 3500 ?Head size: Large ?Session: 11th ? ?Patient did tolerate procedure well. ? ?ECSWT Note ?Hayley Walters ?1946/07/08 ? ?Procedure: ECSWT ?Indications: left hip pain  ? ?Procedure Details ?Consent: Risks of procedure as well as the alternatives and risks of each were explained to the (patient/caregiver).  Consent for procedure obtained. ?Time Out: Verified patient identification, verified procedure, site/side was marked, verified correct patient position, special equipment/implants available, medications/allergies/relevent history reviewed, required imaging and test  results available.  Performed.  The area was cleaned with iodine and alcohol swabs.   ? ?The left hip was targeted for Extracorporeal shockwave therapy.  ? ?Preset: Trochanteric bursitis ?Power Level: 120 ?Frequency: 10 ?Impulse/cycles: 3500 ?Head size: Large ?Session:  11th ? ?Patient did tolerate procedure well. ? ? ? ?ASSESSMENT & PLAN:  ? ?Greater trochanteric pain syndrome of both lower extremities ?Completed shockwave therapy ? ? ? ? ?

## 2022-02-13 ENCOUNTER — Encounter: Payer: Self-pay | Admitting: Family Medicine

## 2022-02-13 ENCOUNTER — Ambulatory Visit: Payer: Self-pay | Admitting: Family Medicine

## 2022-02-13 DIAGNOSIS — M25552 Pain in left hip: Secondary | ICD-10-CM

## 2022-02-13 DIAGNOSIS — M25551 Pain in right hip: Secondary | ICD-10-CM

## 2022-02-13 NOTE — Assessment & Plan Note (Signed)
Completed shockwave therapy  

## 2022-02-13 NOTE — Progress Notes (Signed)
?Hayley Walters - 76 y.o. female MRN [id number]  Date of birth: 1946/05/29 ? ?SUBJECTIVE:  Including CC & ROS.  ?No chief complaint on file. ? ? ?Hayley Walters is a 76 y.o. female that is  here for shockwave therapy. ? ? ? ?Review of Systems ?See HPI  ? ?HISTORY: Past Medical, Surgical, Social, and Family History Reviewed & Updated per EMR.   ?Pertinent Historical Findings include: ? ?Past Medical History:  ?Diagnosis Date  ? Acute carpal tunnel syndrome of right wrist 07/21/2015  ? Anemia   ? Arthritis   ? Cancer [facility])   ? Basal cell   ? Colon polyps   ? Depression   ? situational  ? Fracture of radius, distal, right, closed 07/21/2015  ? Headache   ? migraines  ? History of kidney stones   ? IBS (irritable bowel syndrome)   ? Non-alcoholic fatty liver disease   ? mild  ? Osteoporosis   ? Pneumonia   ? as a child  ? Pulmonary emboli ([place])   ? ? ?Past Surgical History:  ?Procedure Laterality Date  ? ABDOMINAL HYSTERECTOMY    ? BREAST SURGERY    ? augmentation  ? CARPAL TUNNEL RELEASE Right 07/21/2015  ? Procedure: RIGHT CARPAL TUNNEL RELEASE ;  Surgeon: Marchia Bond, MD;  Location: [place];  Service: Orthopedics;  Laterality: Right;  ? COLONOSCOPY    ? CYSTOSCOPY/URETEROSCOPY/HOLMIUM LASER/STENT PLACEMENT Left 12/05/2020  ? Procedure: CYSTOSCOPY/LEFT RETROGRADE PLYOGRAM/LEFT DIAGNOSTIC URETEROSCOPY AND STENT PLACEMENT;  Surgeon: Festus Aloe, MD;  [facility];  Service: Urology;  Laterality: Left;  ONLY NEEDS 60 MIN  ? CYSTOSCOPY/URETEROSCOPY/HOLMIUM LASER/STENT PLACEMENT Left 01/12/2021  ? Procedure: HQIONGEXBM/WUXLKGMWNU/UVOZDGUYQIHK/VQQVZDG LASER/STENT PLACEMENT;  Surgeon: Festus Aloe, MD;  [facility];  Service: Urology;  Laterality: Left;  ONLY NEEDS 70 MIN  ? FRACTURE SURGERY Left   ? hip/femur  ? FRACTURE SURGERY Right   ? screws in right hip for stress fracture  ? OPEN REDUCTION INTERNAL FIXATION (ORIF) DISTAL RADIAL FRACTURE Right 07/21/2015  ? Procedure: OPEN REDUCTION  INTERNAL FIXATION (ORIF) RIGHT DISTAL RADIAL FRACTURE;  Surgeon: Marchia Bond, MD;  Location: [place];  Service: Orthopedics;  Laterality: Right;  ? TONSILLECTOMY    ? ? ? ?PHYSICAL EXAM:  ?VS: Ht 5' (1.524 m)   Wt 156 lb (70.8 kg)   BMI 30.47 kg/m?  ?Physical Exam ?Gen: NAD, alert, cooperative with exam, well-appearing ?MSK:  ?Neurovascularly intact   ? ?ECSWT Note ?Hayley Walters ?11/06/1946 ? ?Procedure: ECSWT ?Indications: left hip pain  ? ?Procedure Details ?Consent: Risks of procedure as well as the alternatives and risks of each were explained to the (patient/caregiver).  Consent for procedure obtained. ?Time Out: Verified patient identification, verified procedure, site/side was marked, verified correct patient position, special equipment/implants available, medications/allergies/relevent history reviewed, required imaging and test results available.  Performed.  The area was cleaned with iodine and alcohol swabs.   ? ?The left hip was targeted for Extracorporeal shockwave therapy.  ? ?Preset: trochanteric bursitis  ?Power Level: 120 ?Frequency: 10 ?Impulse/cycles: 3500 ?Head size: large  ?Session: 12th ? ?Patient did tolerate procedure well. ? ?ECSWT Note ?Hayley Walters ?10/20/1946 ? ?Procedure: ECSWT ?Indications: right hip pain  ? ?Procedure Details ?Consent: Risks of procedure as well as the alternatives and risks of each were explained to the (patient/caregiver).  Consent for procedure obtained. ?Time Out: Verified patient identification, verified procedure, site/side was marked, verified correct patient position, special equipment/implants available, medications/allergies/relevent history reviewed,  required imaging and test results available.  Performed.  The area was cleaned with iodine and alcohol swabs.   ? ?The right hip was targeted for Extracorporeal shockwave therapy.  ? ?Preset: trochanteric bursitis  ?Power Level: 120 ?Frequency: 10 ?Impulse/cycles: 3500 ?Head size: large   ?Session: 12th ? ?Patient did tolerate procedure well. ? ? ? ?ASSESSMENT & PLAN:  ? ?Greater trochanteric pain syndrome of both lower extremities ?Completed shockwave therapy ? ? ? ? ?

## 2022-02-26 ENCOUNTER — Ambulatory Visit: Payer: Self-pay | Admitting: Family Medicine

## 2022-02-26 ENCOUNTER — Encounter: Payer: Self-pay | Admitting: Family Medicine

## 2022-02-26 DIAGNOSIS — M25552 Pain in left hip: Secondary | ICD-10-CM

## 2022-02-26 DIAGNOSIS — M25551 Pain in right hip: Secondary | ICD-10-CM

## 2022-02-26 NOTE — Assessment & Plan Note (Signed)
Completed shockwave therapy  

## 2022-02-26 NOTE — Progress Notes (Signed)
?Hayley Walters - 76 y.o. female MRN [id number]  Date of birth: Sep 13, 1946 ? ?SUBJECTIVE:  Including CC & ROS.  ?No chief complaint on file. ? ? ?Hayley Walters is a 76 y.o. female that is here for shockwave therapy. ? ? ? ?Review of Systems ?See HPI  ? ?HISTORY: Past Medical, Surgical, Social, and Family History Reviewed & Updated per EMR.   ?Pertinent Historical Findings include: ? ?Past Medical History:  ?Diagnosis Date  ? Acute carpal tunnel syndrome of right wrist 07/21/2015  ? Anemia   ? Arthritis   ? Cancer [facility])   ? Basal cell   ? Colon polyps   ? Depression   ? situational  ? Fracture of radius, distal, right, closed 07/21/2015  ? Headache   ? migraines  ? History of kidney stones   ? IBS (irritable bowel syndrome)   ? Non-alcoholic fatty liver disease   ? mild  ? Osteoporosis   ? Pneumonia   ? as a child  ? Pulmonary emboli ([place])   ? ? ?Past Surgical History:  ?Procedure Laterality Date  ? ABDOMINAL HYSTERECTOMY    ? BREAST SURGERY    ? augmentation  ? CARPAL TUNNEL RELEASE Right 07/21/2015  ? Procedure: RIGHT CARPAL TUNNEL RELEASE ;  Surgeon: Marchia Bond, MD;  Location: [place];  Service: Orthopedics;  Laterality: Right;  ? COLONOSCOPY    ? CYSTOSCOPY/URETEROSCOPY/HOLMIUM LASER/STENT PLACEMENT Left 12/05/2020  ? Procedure: CYSTOSCOPY/LEFT RETROGRADE PLYOGRAM/LEFT DIAGNOSTIC URETEROSCOPY AND STENT PLACEMENT;  Surgeon: Festus Aloe, MD;  [facility];  Service: Urology;  Laterality: Left;  ONLY NEEDS 60 MIN  ? CYSTOSCOPY/URETEROSCOPY/HOLMIUM LASER/STENT PLACEMENT Left 01/12/2021  ? Procedure: WNUUVOZDGU/YQIHKVQQVZ/DGLOVFIEPPIR/JJOACZY LASER/STENT PLACEMENT;  Surgeon: Festus Aloe, MD;  [facility];  Service: Urology;  Laterality: Left;  ONLY NEEDS 70 MIN  ? FRACTURE SURGERY Left   ? hip/femur  ? FRACTURE SURGERY Right   ? screws in right hip for stress fracture  ? OPEN REDUCTION INTERNAL FIXATION (ORIF) DISTAL RADIAL FRACTURE Right 07/21/2015  ? Procedure: OPEN REDUCTION INTERNAL  FIXATION (ORIF) RIGHT DISTAL RADIAL FRACTURE;  Surgeon: Marchia Bond, MD;  Location: [place];  Service: Orthopedics;  Laterality: Right;  ? TONSILLECTOMY    ? ? ? ?PHYSICAL EXAM:  ?VS: Ht 5' (1.524 m)   Wt 156 lb (70.8 kg)   BMI 30.47 kg/m?  ?Physical Exam ?Gen: NAD, alert, cooperative with exam, well-appearing ?MSK:  ?Neurovascularly intact   ? ?ECSWT Note ?Hayley Walters ?1945/12/22 ? ?Procedure: ECSWT ?Indications: right hip pain ? ?Procedure Details ?Consent: Risks of procedure as well as the alternatives and risks of each were explained to the (patient/caregiver).  Consent for procedure obtained. ?Time Out: Verified patient identification, verified procedure, site/side was marked, verified correct patient position, special equipment/implants available, medications/allergies/relevent history reviewed, required imaging and test results available.  Performed.  The area was cleaned with iodine and alcohol swabs.   ? ?The right hip was targeted for Extracorporeal shockwave therapy.  ? ?Preset: trochanteric bursitis  ?Power Level: 120 ?Frequency: 10 ?Impulse/cycles: 3500 ?Head size: large  ?Session: 13 ? ?Patient did tolerate procedure well. ? ?ECSWT Note ?Hayley Walters ?[birth year] ? ?Procedure: ECSWT ?Indications: left hip pain ? ?Procedure Details ?Consent: Risks of procedure as well as the alternatives and risks of each were explained to the (patient/caregiver).  Consent for procedure obtained. ?Time Out: Verified patient identification, verified procedure, site/side was marked, verified correct patient position, special equipment/implants available, medications/allergies/relevent history reviewed, required imaging and  test results available.  Performed.  The area was cleaned with iodine and alcohol swabs.   ? ?The left hip was targeted for Extracorporeal shockwave therapy.  ? ?Preset: trochanteric bursitis  ?Power Level: 120 ?Frequency: 10 ?Impulse/cycles: 3500 ?Head size: large  ?Session:  13 ? ?Patient did tolerate procedure well. ? ? ? ?ASSESSMENT & PLAN:  ? ?Greater trochanteric pain syndrome of both lower extremities ?Completed shockwave therapy  ? ? ? ? ?

## 2022-03-12 ENCOUNTER — Ambulatory Visit: Payer: Self-pay | Admitting: Family Medicine

## 2022-03-12 ENCOUNTER — Encounter: Payer: Self-pay | Admitting: Family Medicine

## 2022-03-12 ENCOUNTER — Ambulatory Visit: Payer: Self-pay

## 2022-03-12 ENCOUNTER — Ambulatory Visit: Payer: PPO | Admitting: Family Medicine

## 2022-03-12 VITALS — Ht 60.0 in | Wt 156.0 lb

## 2022-03-12 VITALS — BP 122/70 | Ht 60.0 in | Wt 156.0 lb

## 2022-03-12 DIAGNOSIS — M25551 Pain in right hip: Secondary | ICD-10-CM

## 2022-03-12 DIAGNOSIS — M25552 Pain in left hip: Secondary | ICD-10-CM

## 2022-03-12 MED ORDER — METHYLPREDNISOLONE ACETATE 40 MG/ML IJ SUSP
40.0000 mg | Freq: Once | INTRAMUSCULAR | Status: AC
  Administered 2022-03-12: 40 mg via INTRA_ARTICULAR

## 2022-03-12 NOTE — Progress Notes (Signed)
?Hayley Walters - 76 y.o. female MRN [id number]  Date of birth: 1946/10/15 ? ?SUBJECTIVE:  Including CC & ROS.  ?No chief complaint on file. ? ? ?Hayley Walters is a 76 y.o. female that is here for shockwave therapy. ? ? ? ?Review of Systems ?See HPI  ? ?HISTORY: Past Medical, Surgical, Social, and Family History Reviewed & Updated per EMR.   ?Pertinent Historical Findings include: ? ?Past Medical History:  ?Diagnosis Date  ? Acute carpal tunnel syndrome of right wrist 07/21/2015  ? Anemia   ? Arthritis   ? Cancer [facility])   ? Basal cell   ? Colon polyps   ? Depression   ? situational  ? Fracture of radius, distal, right, closed 07/21/2015  ? Headache   ? migraines  ? History of kidney stones   ? IBS (irritable bowel syndrome)   ? Non-alcoholic fatty liver disease   ? mild  ? Osteoporosis   ? Pneumonia   ? as a child  ? Pulmonary emboli ([place])   ? ? ?Past Surgical History:  ?Procedure Laterality Date  ? ABDOMINAL HYSTERECTOMY    ? BREAST SURGERY    ? augmentation  ? CARPAL TUNNEL RELEASE Right 07/21/2015  ? Procedure: RIGHT CARPAL TUNNEL RELEASE ;  Surgeon: Marchia Bond, MD;  Location: [place];  Service: Orthopedics;  Laterality: Right;  ? COLONOSCOPY    ? CYSTOSCOPY/URETEROSCOPY/HOLMIUM LASER/STENT PLACEMENT Left 12/05/2020  ? Procedure: CYSTOSCOPY/LEFT RETROGRADE PLYOGRAM/LEFT DIAGNOSTIC URETEROSCOPY AND STENT PLACEMENT;  Surgeon: Festus Aloe, MD;  [facility];  Service: Urology;  Laterality: Left;  ONLY NEEDS 60 MIN  ? CYSTOSCOPY/URETEROSCOPY/HOLMIUM LASER/STENT PLACEMENT Left 01/12/2021  ? Procedure: JQZESPQZRA/QTMAUQJFHL/KTGYBWLSLHTD/SKAJGOT LASER/STENT PLACEMENT;  Surgeon: Festus Aloe, MD;  [facility];  Service: Urology;  Laterality: Left;  ONLY NEEDS 70 MIN  ? FRACTURE SURGERY Left   ? hip/femur  ? FRACTURE SURGERY Right   ? screws in right hip for stress fracture  ? OPEN REDUCTION INTERNAL FIXATION (ORIF) DISTAL RADIAL FRACTURE Right 07/21/2015  ? Procedure: OPEN REDUCTION INTERNAL  FIXATION (ORIF) RIGHT DISTAL RADIAL FRACTURE;  Surgeon: Marchia Bond, MD;  Location: [place];  Service: Orthopedics;  Laterality: Right;  ? TONSILLECTOMY    ? ? ? ?PHYSICAL EXAM:  ?VS: Ht 5' (1.524 m)   Wt 156 lb (70.8 kg)   BMI 30.47 kg/m?  ?Physical Exam ?Gen: NAD, alert, cooperative with exam, well-appearing ?MSK:  ?Neurovascularly intact   ? ?ECSWT Note ?Hayley Walters ?1946/09/01 ? ?Procedure: ECSWT ?Indications: right hip pain  ? ?Procedure Details ?Consent: Risks of procedure as well as the alternatives and risks of each were explained to the (patient/caregiver).  Consent for procedure obtained. ?Time Out: Verified patient identification, verified procedure, site/side was marked, verified correct patient position, special equipment/implants available, medications/allergies/relevent history reviewed, required imaging and test results available.  Performed.  The area was cleaned with iodine and alcohol swabs.   ? ?The right hip pain was targeted for Extracorporeal shockwave therapy.  ? ?Preset: trochanteric bursitis ?Power Level: 130 ?Frequency: 10 ?Impulse/cycles: 3500 ?Head size: large  ?Session: 14th ? ?Patient did tolerate procedure well. ? ?ECSWT Note ?Hayley Walters ?Aug 19, 1946 ? ?Procedure: ECSWT ?Indications: left hip pain  ? ?Procedure Details ?Consent: Risks of procedure as well as the alternatives and risks of each were explained to the (patient/caregiver).  Consent for procedure obtained. ?Time Out: Verified patient identification, verified procedure, site/side was marked, verified correct patient position, special equipment/implants available, medications/allergies/relevent history reviewed, required  imaging and test results available.  Performed.  The area was cleaned with iodine and alcohol swabs.   ? ?The left hip pain was targeted for Extracorporeal shockwave therapy.  ? ?Preset: trochanteric bursitis ?Power Level: 130 ?Frequency: 10 ?Impulse/cycles: 3500 ?Head size: large   ?Session: 14th ? ?Patient did tolerate procedure well. ? ?ASSESSMENT & PLAN:  ? ?Greater trochanteric pain syndrome of both lower extremities ?Completed shockwave therapy ? ? ? ? ?

## 2022-03-12 NOTE — Assessment & Plan Note (Signed)
Acutely worsen.  Has been doing well with shockwave therapy but pain has become more severe in nature. ?-Counseled on home exercise therapy and supportive care. ?-Injection today bilaterally ?-could consider physical therapy. ?

## 2022-03-12 NOTE — Progress Notes (Signed)
?Hayley Walters - 76 y.o. female MRN [id number]  Date of birth: 01-04-1946 ? ?SUBJECTIVE:  Including CC & ROS.  ?No chief complaint on file. ? ? ?Hayley Walters is a 76 y.o. female that is presenting with acute worsening of her bilateral hip pain.  She has been doing well with shockwave therapy but recently the pain is worsened.  Pain is localized to the lateral aspect of each hip. ? ? ?Review of Systems ?See HPI  ? ?HISTORY: Past Medical, Surgical, Social, and Family History Reviewed & Updated per EMR.   ?Pertinent Historical Findings include: ? ?Past Medical History:  ?Diagnosis Date  ? Acute carpal tunnel syndrome of right wrist 07/21/2015  ? Anemia   ? Arthritis   ? Cancer [facility])   ? Basal cell   ? Colon polyps   ? Depression   ? situational  ? Fracture of radius, distal, right, closed 07/21/2015  ? Headache   ? migraines  ? History of kidney stones   ? IBS (irritable bowel syndrome)   ? Non-alcoholic fatty liver disease   ? mild  ? Osteoporosis   ? Pneumonia   ? as a child  ? Pulmonary emboli ([place])   ? ? ?Past Surgical History:  ?Procedure Laterality Date  ? ABDOMINAL HYSTERECTOMY    ? BREAST SURGERY    ? augmentation  ? CARPAL TUNNEL RELEASE Right 07/21/2015  ? Procedure: RIGHT CARPAL TUNNEL RELEASE ;  Surgeon: Marchia Bond, MD;  Location: [place];  Service: Orthopedics;  Laterality: Right;  ? COLONOSCOPY    ? CYSTOSCOPY/URETEROSCOPY/HOLMIUM LASER/STENT PLACEMENT Left 12/05/2020  ? Procedure: CYSTOSCOPY/LEFT RETROGRADE PLYOGRAM/LEFT DIAGNOSTIC URETEROSCOPY AND STENT PLACEMENT;  Surgeon: Festus Aloe, MD;  [facility];  Service: Urology;  Laterality: Left;  ONLY NEEDS 60 MIN  ? CYSTOSCOPY/URETEROSCOPY/HOLMIUM LASER/STENT PLACEMENT Left 01/12/2021  ? Procedure: HCWCBJSEGB/TDVVOHYWVP/XTGGYIRSWNIO/EVOJJKK LASER/STENT PLACEMENT;  Surgeon: Festus Aloe, MD;  [facility];  Service: Urology;  Laterality: Left;  ONLY NEEDS 70 MIN  ? FRACTURE SURGERY Left   ? hip/femur  ? FRACTURE SURGERY  Right   ? screws in right hip for stress fracture  ? OPEN REDUCTION INTERNAL FIXATION (ORIF) DISTAL RADIAL FRACTURE Right 07/21/2015  ? Procedure: OPEN REDUCTION INTERNAL FIXATION (ORIF) RIGHT DISTAL RADIAL FRACTURE;  Surgeon: Marchia Bond, MD;  Location: [place];  Service: Orthopedics;  Laterality: Right;  ? TONSILLECTOMY    ? ? ? ?PHYSICAL EXAM:  ?VS: BP 122/70 (BP Location: Left Arm, Patient Position: Sitting)   Ht 5' (1.524 m)   Wt 156 lb (70.8 kg)   BMI 30.47 kg/m?  ?Physical Exam ?Gen: NAD, alert, cooperative with exam, well-appearing ?MSK:  ?Neurovascularly intact   ? ? ?Aspiration/Injection Procedure Note ?Hayley Walters ?04-05-1946 ? ?Procedure: Injection ?Indications: Right hip pain ? ?Procedure Details ?Consent: Risks of procedure as well as the alternatives and risks of each were explained to the (patient/caregiver).  Consent for procedure obtained. ?Time Out: Verified patient identification, verified procedure, site/side was marked, verified correct patient position, special equipment/implants available, medications/allergies/relevent history reviewed, required imaging and test results available.  Performed.  The area was cleaned with iodine and alcohol swabs.   ? ?The right greater trochanteric bursa was injected using 1 cc's of 40 mg Depo-Medrol and 4 cc's of 0.25% bupivacaine with a 22 3 1/2" needle.  Ultrasound was used. Images were obtained in short views showing the injection.   ? ? ?A sterile dressing was applied. ? ?Patient did tolerate procedure well. ? ?  Aspiration/Injection Procedure Note ?Hayley Walters ?[birth year] ? ?Procedure: Injection ?Indications: left hip pain ? ?Procedure Details ?Consent: Risks of procedure as well as the alternatives and risks of each were explained to the (patient/caregiver).  Consent for procedure obtained. ?Time Out: Verified patient identification, verified procedure, site/side was marked, verified correct patient position, special  equipment/implants available, medications/allergies/relevent history reviewed, required imaging and test results available.  Performed.  The area was cleaned with iodine and alcohol swabs.   ? ?The left greater trochanteric bursa was injected using 1 cc's of 40 mg Depo-Medrol and 4 cc's of 0.25% bupivacaine with a 22 3 1/2" needle.  Ultrasound was used. Images were obtained in short views showing the injection.   ? ? ?A sterile dressing was applied. ? ?Patient did tolerate procedure well. ? ? ?ASSESSMENT & PLAN:  ? ?Greater trochanteric pain syndrome of both lower extremities ?Acutely worsen.  Has been doing well with shockwave therapy but pain has become more severe in nature. ?-Counseled on home exercise therapy and supportive care. ?-Injection today bilaterally ?-could consider physical therapy. ? ? ? ? ?

## 2022-03-12 NOTE — Assessment & Plan Note (Signed)
Completed shockwave therapy  

## 2022-03-12 NOTE — Addendum Note (Signed)
Addended by: Cresenciano Lick on: 03/12/2022 01:34 PM ? ? Modules accepted: Orders ? ?

## 2022-03-26 ENCOUNTER — Encounter: Payer: Self-pay | Admitting: Family Medicine

## 2022-03-26 ENCOUNTER — Ambulatory Visit: Payer: Self-pay | Admitting: Family Medicine

## 2022-03-26 DIAGNOSIS — M25552 Pain in left hip: Secondary | ICD-10-CM

## 2022-03-26 DIAGNOSIS — M25551 Pain in right hip: Secondary | ICD-10-CM

## 2022-03-26 NOTE — Assessment & Plan Note (Signed)
Completed shockwave therapy  

## 2022-03-26 NOTE — Progress Notes (Signed)
?Hayley Walters - 76 y.o. female MRN [id number]  Date of birth: 08/27/1946 ? ?SUBJECTIVE:  Including CC & ROS.  ?No chief complaint on file. ? ? ?Hayley Walters is a 76 y.o. female that is here for shockwave therapy. ? ? ?Review of Systems ?See HPI  ? ?HISTORY: Past Medical, Surgical, Social, and Family History Reviewed & Updated per EMR.   ?Pertinent Historical Findings include: ? ?Past Medical History:  ?Diagnosis Date  ? Acute carpal tunnel syndrome of right wrist 07/21/2015  ? Anemia   ? Arthritis   ? Cancer [facility])   ? Basal cell   ? Colon polyps   ? Depression   ? situational  ? Fracture of radius, distal, right, closed 07/21/2015  ? Headache   ? migraines  ? History of kidney stones   ? IBS (irritable bowel syndrome)   ? Non-alcoholic fatty liver disease   ? mild  ? Osteoporosis   ? Pneumonia   ? as a child  ? Pulmonary emboli ([place])   ? ? ?Past Surgical History:  ?Procedure Laterality Date  ? ABDOMINAL HYSTERECTOMY    ? BREAST SURGERY    ? augmentation  ? CARPAL TUNNEL RELEASE Right 07/21/2015  ? Procedure: RIGHT CARPAL TUNNEL RELEASE ;  Surgeon: Marchia Bond, MD;  Location: [place];  Service: Orthopedics;  Laterality: Right;  ? COLONOSCOPY    ? CYSTOSCOPY/URETEROSCOPY/HOLMIUM LASER/STENT PLACEMENT Left 12/05/2020  ? Procedure: CYSTOSCOPY/LEFT RETROGRADE PLYOGRAM/LEFT DIAGNOSTIC URETEROSCOPY AND STENT PLACEMENT;  Surgeon: Festus Aloe, MD;  [facility];  Service: Urology;  Laterality: Left;  ONLY NEEDS 60 MIN  ? CYSTOSCOPY/URETEROSCOPY/HOLMIUM LASER/STENT PLACEMENT Left 01/12/2021  ? Procedure: MOQHUTMLYY/TKPTWSFKCL/EXNTZGYFVCBS/WHQPRFF LASER/STENT PLACEMENT;  Surgeon: Festus Aloe, MD;  [facility];  Service: Urology;  Laterality: Left;  ONLY NEEDS 70 MIN  ? FRACTURE SURGERY Left   ? hip/femur  ? FRACTURE SURGERY Right   ? screws in right hip for stress fracture  ? OPEN REDUCTION INTERNAL FIXATION (ORIF) DISTAL RADIAL FRACTURE Right 07/21/2015  ? Procedure: OPEN REDUCTION INTERNAL  FIXATION (ORIF) RIGHT DISTAL RADIAL FRACTURE;  Surgeon: Marchia Bond, MD;  Location: [place];  Service: Orthopedics;  Laterality: Right;  ? TONSILLECTOMY    ? ? ? ?PHYSICAL EXAM:  ?VS: Ht 5' (1.524 m)   Wt 156 lb (70.8 kg)   BMI 30.47 kg/m?  ?Physical Exam ?Gen: NAD, alert, cooperative with exam, well-appearing ?MSK:  ?Neurovascularly intact   ? ?ECSWT Note ?Hayley Walters ?[birth year] ? ?Procedure: ECSWT ?Indications: right hip pain  ? ?Procedure Details ?Consent: Risks of procedure as well as the alternatives and risks of each were explained to the (patient/caregiver).  Consent for procedure obtained. ?Time Out: Verified patient identification, verified procedure, site/side was marked, verified correct patient position, special equipment/implants available, medications/allergies/relevent history reviewed, required imaging and test results available.  Performed.  The area was cleaned with iodine and alcohol swabs.   ? ?The right hip was targeted for Extracorporeal shockwave therapy.  ? ?Preset: Trochanteric bursitis ?Power Level: 130 ?Frequency: 10 ?Impulse/cycles: 3400 ?Head size: Large ?Session: 15th ? ?Patient did tolerate procedure well. ? ?ECSWT Note ?Hayley Walters ?1946-05-26 ? ?Procedure: ECSWT ?Indications: left hip pain  ? ?Procedure Details ?Consent: Risks of procedure as well as the alternatives and risks of each were explained to the (patient/caregiver).  Consent for procedure obtained. ?Time Out: Verified patient identification, verified procedure, site/side was marked, verified correct patient position, special equipment/implants available, medications/allergies/relevent history reviewed, required imaging and test  results available.  Performed.  The area was cleaned with iodine and alcohol swabs.   ? ?The left hip was targeted for Extracorporeal shockwave therapy.  ? ?Preset: Trochanteric bursitis ?Power Level: 130 ?Frequency: 10 ?Impulse/cycles: 3400 ?Head size: Large ?Session:  15th ? ?Patient did tolerate procedure well. ? ? ?ASSESSMENT & PLAN:  ? ?Greater trochanteric pain syndrome of both lower extremities ?Completed shockwave therapy.  ? ? ? ? ?

## 2022-04-02 NOTE — Telephone Encounter (Signed)
Prolia VOB initiated via [email or web address] ? ?Last OV: 03/26/22 ?Next OV:  ?Last Prolia inj: 11/02/21 ?Next Prolia inj DUE: 05/04/22 ? ?

## 2022-04-09 ENCOUNTER — Encounter: Payer: Self-pay | Admitting: Family Medicine

## 2022-04-09 ENCOUNTER — Ambulatory Visit: Payer: Self-pay | Admitting: Family Medicine

## 2022-04-09 DIAGNOSIS — M25552 Pain in left hip: Secondary | ICD-10-CM

## 2022-04-09 DIAGNOSIS — M25551 Pain in right hip: Secondary | ICD-10-CM

## 2022-04-09 MED ORDER — BACLOFEN 10 MG PO TABS
5.0000 mg | ORAL_TABLET | Freq: Three times a day (TID) | ORAL | 1 refills | Status: DC | PRN
Start: 2022-04-09 — End: 2022-07-23

## 2022-04-09 NOTE — Assessment & Plan Note (Signed)
Completed shockwave therapy today. ?Provided baclofen. ?

## 2022-04-09 NOTE — Progress Notes (Signed)
?Hayley Walters - 76 y.o. female MRN [id number]  Date of birth: [birth year] ? ?SUBJECTIVE:  Including CC & ROS.  ?No chief complaint on file. ? ? ?Hayley Walters is a 76 y.o. female that is  here for shockwave therapy. ? ? ? ?Review of Systems ?See HPI  ? ?HISTORY: Past Medical, Surgical, Social, and Family History Reviewed & Updated per EMR.   ?Pertinent Historical Findings include: ? ?Past Medical History:  ?Diagnosis Date  ? Acute carpal tunnel syndrome of right wrist 07/21/2015  ? Anemia   ? Arthritis   ? Cancer [facility])   ? Basal cell   ? Colon polyps   ? Depression   ? situational  ? Fracture of radius, distal, right, closed 07/21/2015  ? Headache   ? migraines  ? History of kidney stones   ? IBS (irritable bowel syndrome)   ? Non-alcoholic fatty liver disease   ? mild  ? Osteoporosis   ? Pneumonia   ? as a child  ? Pulmonary emboli ([place])   ? ? ?Past Surgical History:  ?Procedure Laterality Date  ? ABDOMINAL HYSTERECTOMY    ? BREAST SURGERY    ? augmentation  ? CARPAL TUNNEL RELEASE Right 07/21/2015  ? Procedure: RIGHT CARPAL TUNNEL RELEASE ;  Surgeon: Marchia Bond, MD;  Location: [place];  Service: Orthopedics;  Laterality: Right;  ? COLONOSCOPY    ? CYSTOSCOPY/URETEROSCOPY/HOLMIUM LASER/STENT PLACEMENT Left 12/05/2020  ? Procedure: CYSTOSCOPY/LEFT RETROGRADE PLYOGRAM/LEFT DIAGNOSTIC URETEROSCOPY AND STENT PLACEMENT;  Surgeon: Festus Aloe, MD;  [facility];  Service: Urology;  Laterality: Left;  ONLY NEEDS 60 MIN  ? CYSTOSCOPY/URETEROSCOPY/HOLMIUM LASER/STENT PLACEMENT Left 01/12/2021  ? Procedure: QQPYPPJKDT/OIZTIWPYKD/XIPJASNKNLZJ/QBHALPF LASER/STENT PLACEMENT;  Surgeon: Festus Aloe, MD;  [facility];  Service: Urology;  Laterality: Left;  ONLY NEEDS 70 MIN  ? FRACTURE SURGERY Left   ? hip/femur  ? FRACTURE SURGERY Right   ? screws in right hip for stress fracture  ? OPEN REDUCTION INTERNAL FIXATION (ORIF) DISTAL RADIAL FRACTURE Right 07/21/2015  ? Procedure: OPEN REDUCTION  INTERNAL FIXATION (ORIF) RIGHT DISTAL RADIAL FRACTURE;  Surgeon: Marchia Bond, MD;  Location: [place];  Service: Orthopedics;  Laterality: Right;  ? TONSILLECTOMY    ? ? ? ?PHYSICAL EXAM:  ?VS: Ht 5' (1.524 m)   Wt 156 lb (70.8 kg)   BMI 30.47 kg/m?  ?Physical Exam ?Gen: NAD, alert, cooperative with exam, well-appearing ?MSK:  ?Neurovascularly intact   ? ?ECSWT Note ?Hayley Walters ?1946/09/04 ? ?Procedure: ECSWT ?Indications: Right hip pain ? ?Procedure Details ?Consent: Risks of procedure as well as the alternatives and risks of each were explained to the (patient/caregiver).  Consent for procedure obtained. ?Time Out: Verified patient identification, verified procedure, site/side was marked, verified correct patient position, special equipment/implants available, medications/allergies/relevent history reviewed, required imaging and test results available.  Performed.  The area was cleaned with iodine and alcohol swabs.   ? ?The right hip was targeted for Extracorporeal shockwave therapy.  ? ?Preset: Trochanteric bursitis ?Power Level: 120 ?Frequency: 10 ?Impulse/cycles: 3200 ?Head size: Large ?Session: 16 ? ?Patient did tolerate procedure well. ? ?ECSWT Note ?Hayley Walters ?September 01, 1946 ? ?Procedure: ECSWT ?Indications: Left hip pain ? ?Procedure Details ?Consent: Risks of procedure as well as the alternatives and risks of each were explained to the (patient/caregiver).  Consent for procedure obtained. ?Time Out: Verified patient identification, verified procedure, site/side was marked, verified correct patient position, special equipment/implants available, medications/allergies/relevent history reviewed, required imaging and test  results available.  Performed.  The area was cleaned with iodine and alcohol swabs.   ? ?The left hip was targeted for Extracorporeal shockwave therapy.  ? ?Preset: Trochanteric bursitis ?Power Level: 120 ?Frequency: 10 ?Impulse/cycles: 3200 ?Head size: Large ?Session:  16 ? ?Patient did tolerate procedure well. ? ? ?ASSESSMENT & PLAN:  ? ?Greater trochanteric pain syndrome of both lower extremities ?Completed shockwave therapy today. ?Provided baclofen. ? ? ? ? ?

## 2022-04-16 NOTE — Telephone Encounter (Signed)
Pt ready for scheduling on or after 05/04/22 ? ?Out-of-pocket cost due at time of visit: $301 ? ?Primary: [employer] Adv ?Prolia co-insurance: 20% (approximately $276) ?Admin fee co-insurance: 20% (approximately $25) ? ?Secondary: n/a ?Prolia co-insurance:  ?Admin fee co-insurance:  ? ?Deductible: does not apply ? ?Prior Auth: not required ?PA# ?Valid:  ? ?** This summary of benefits is an estimation of the patient's out-of-pocket cost. Exact cost may vary based on individual plan coverage.  ? ?

## 2022-04-23 ENCOUNTER — Ambulatory Visit: Payer: PPO | Admitting: Family Medicine

## 2022-05-01 NOTE — Telephone Encounter (Addendum)
Pt informed of below.  OV is scheduled 6/8.   She wants to know if she needs labs prior to starting Prolia. Please advise.

## 2022-05-09 ENCOUNTER — Ambulatory Visit: Payer: PPO | Admitting: Family Medicine

## 2022-05-14 DIAGNOSIS — Z1231 Encounter for screening mammogram for malignant neoplasm of breast: Secondary | ICD-10-CM | POA: Diagnosis not present

## 2022-05-27 NOTE — Telephone Encounter (Signed)
Appt 05/09/22 canceled, please follow up with patient, thanks!

## 2022-06-06 ENCOUNTER — Ambulatory Visit: Payer: PPO | Admitting: Family Medicine

## 2022-06-06 VITALS — Ht 60.0 in | Wt 156.0 lb

## 2022-06-06 DIAGNOSIS — M818 Other osteoporosis without current pathological fracture: Secondary | ICD-10-CM

## 2022-06-06 DIAGNOSIS — M25551 Pain in right hip: Secondary | ICD-10-CM | POA: Diagnosis not present

## 2022-06-06 DIAGNOSIS — M25552 Pain in left hip: Secondary | ICD-10-CM | POA: Diagnosis not present

## 2022-06-06 MED ORDER — DENOSUMAB 60 MG/ML ~~LOC~~ SOSY
60.0000 mg | PREFILLED_SYRINGE | Freq: Once | SUBCUTANEOUS | Status: AC
  Administered 2022-06-06: 60 mg via SUBCUTANEOUS

## 2022-06-06 NOTE — Progress Notes (Signed)
  Hayley Walters - 76 y.o. female MRN [id number]  Date of birth: 02/01/1946  SUBJECTIVE:  Including CC & ROS.  No chief complaint on file.   Hayley Walters is a 76 y.o. female that is  here for shockwave therapy.   Review of Systems See HPI   HISTORY: Past Medical, Surgical, Social, and Family History Reviewed & Updated per EMR.   Pertinent Historical Findings include:  Past Medical History:  Diagnosis Date   Acute carpal tunnel syndrome of right wrist 07/21/2015   Anemia    Arthritis    Cancer ([facility])    Basal cell    Colon polyps    Depression    situational   Fracture of radius, distal, right, closed 07/21/2015   Headache    migraines   History of kidney stones    IBS (irritable bowel syndrome)    Non-alcoholic fatty liver disease    mild   Osteoporosis    Pneumonia    as a child   Pulmonary emboli ([place])     Past Surgical History:  Procedure Laterality Date   ABDOMINAL HYSTERECTOMY     BREAST SURGERY     augmentation   CARPAL TUNNEL RELEASE Right 07/21/2015   Procedure: RIGHT CARPAL TUNNEL RELEASE ;  Surgeon: Marchia Bond, MD;  Location: [place];  Service: Orthopedics;  Laterality: Right;   COLONOSCOPY     CYSTOSCOPY/URETEROSCOPY/HOLMIUM LASER/STENT PLACEMENT Left 12/05/2020   Procedure: CYSTOSCOPY/LEFT RETROGRADE PLYOGRAM/LEFT DIAGNOSTIC URETEROSCOPY AND STENT PLACEMENT;  Surgeon: Festus Aloe, MD;  [facility];  Service: Urology;  Laterality: Left;  ONLY NEEDS 60 MIN   CYSTOSCOPY/URETEROSCOPY/HOLMIUM LASER/STENT PLACEMENT Left 01/12/2021   Procedure: CYSTOSCOPY/RETROGRADE/URETEROSCOPY/HOLMIUM LASER/STENT PLACEMENT;  Surgeon: Festus Aloe, MD;  [facility];  Service: Urology;  Laterality: Left;  ONLY NEEDS 70 MIN   FRACTURE SURGERY Left    hip/femur   FRACTURE SURGERY Right    screws in right hip for stress fracture   OPEN REDUCTION INTERNAL FIXATION (ORIF) DISTAL RADIAL FRACTURE Right 07/21/2015   Procedure: OPEN REDUCTION INTERNAL  FIXATION (ORIF) RIGHT DISTAL RADIAL FRACTURE;  Surgeon: Marchia Bond, MD;  Location: [place];  Service: Orthopedics;  Laterality: Right;   TONSILLECTOMY       PHYSICAL EXAM:  VS: Ht 5' (1.524 m)   Wt 156 lb (70.8 kg)   BMI 30.47 kg/m  Physical Exam Gen: NAD, alert, cooperative with exam, well-appearing MSK:  Neurovascularly intact    ECSWT Note Hayley Walters 31-Aug-1946  Procedure: ECSWT Indications: left hip pain  Procedure Details Consent: Risks of procedure as well as the alternatives and risks of each were explained to the (patient/caregiver).  Consent for procedure obtained. Time Out: Verified patient identification, verified procedure, site/side was marked, verified correct patient position, special equipment/implants available, medications/allergies/relevent history reviewed, required imaging and test results available.  Performed.  The area was cleaned with iodine and alcohol swabs.    The left hip was targeted for Extracorporeal shockwave therapy.   Preset: Trochanteric bursitis Power Level: 100 Frequency: 10 Impulse/cycles: 2700 Head size: Large Session: 17  Patient did tolerate procedure well.    ASSESSMENT & PLAN:   Greater trochanteric pain syndrome of both lower extremities Completed shockwave therapy

## 2022-06-06 NOTE — Assessment & Plan Note (Signed)
Completed shockwave therapy  

## 2022-06-14 ENCOUNTER — Ambulatory Visit: Payer: Self-pay | Admitting: Family Medicine

## 2022-06-14 DIAGNOSIS — M25552 Pain in left hip: Secondary | ICD-10-CM

## 2022-06-14 NOTE — Assessment & Plan Note (Signed)
Completed shockwave therapy today.  

## 2022-06-14 NOTE — Progress Notes (Signed)
  Hayley Walters - 76 y.o. female MRN [id number]  Date of birth: 01/11/1946  SUBJECTIVE:  Including CC & ROS.  No chief complaint on file.   Hayley Walters is a 76 y.o. female that is here for shockwave therapy.    Review of Systems See HPI   HISTORY: Past Medical, Surgical, Social, and Family History Reviewed & Updated per EMR.   Pertinent Historical Findings include:  Past Medical History:  Diagnosis Date   Acute carpal tunnel syndrome of right wrist 07/21/2015   Anemia    Arthritis    Cancer ([facility])    Basal cell    Colon polyps    Depression    situational   Fracture of radius, distal, right, closed 07/21/2015   Headache    migraines   History of kidney stones    IBS (irritable bowel syndrome)    Non-alcoholic fatty liver disease    mild   Osteoporosis    Pneumonia    as a child   Pulmonary emboli ([place])     Past Surgical History:  Procedure Laterality Date   ABDOMINAL HYSTERECTOMY     BREAST SURGERY     augmentation   CARPAL TUNNEL RELEASE Right 07/21/2015   Procedure: RIGHT CARPAL TUNNEL RELEASE ;  Surgeon: Marchia Bond, MD;  Location: [place];  Service: Orthopedics;  Laterality: Right;   COLONOSCOPY     CYSTOSCOPY/URETEROSCOPY/HOLMIUM LASER/STENT PLACEMENT Left 12/05/2020   Procedure: CYSTOSCOPY/LEFT RETROGRADE PLYOGRAM/LEFT DIAGNOSTIC URETEROSCOPY AND STENT PLACEMENT;  Surgeon: Festus Aloe, MD;  [facility];  Service: Urology;  Laterality: Left;  ONLY NEEDS 60 MIN   CYSTOSCOPY/URETEROSCOPY/HOLMIUM LASER/STENT PLACEMENT Left 01/12/2021   Procedure: CYSTOSCOPY/RETROGRADE/URETEROSCOPY/HOLMIUM LASER/STENT PLACEMENT;  Surgeon: Festus Aloe, MD;  [facility];  Service: Urology;  Laterality: Left;  ONLY NEEDS 70 MIN   FRACTURE SURGERY Left    hip/femur   FRACTURE SURGERY Right    screws in right hip for stress fracture   OPEN REDUCTION INTERNAL FIXATION (ORIF) DISTAL RADIAL FRACTURE Right 07/21/2015   Procedure: OPEN REDUCTION INTERNAL  FIXATION (ORIF) RIGHT DISTAL RADIAL FRACTURE;  Surgeon: Marchia Bond, MD;  Location: [place];  Service: Orthopedics;  Laterality: Right;   TONSILLECTOMY       PHYSICAL EXAM:  VS: There were no vitals taken for this visit. Physical Exam Gen: NAD, alert, cooperative with exam, well-appearing MSK:  Neurovascularly intact    ECSWT Note Hayley Walters [birth year]  Procedure: ECSWT Indications: left hip pain   Procedure Details Consent: Risks of procedure as well as the alternatives and risks of each were explained to the (patient/caregiver).  Consent for procedure obtained. Time Out: Verified patient identification, verified procedure, site/side was marked, verified correct patient position, special equipment/implants available, medications/allergies/relevent history reviewed, required imaging and test results available.  Performed.  The area was cleaned with iodine and alcohol swabs.    The left hip was targeted for Extracorporeal shockwave therapy.   Preset: Trochanteric bursitis Power Level: 110 Frequency: 10 Impulse/cycles: 2800 Head size: Large Session: 18  Patient did tolerate procedure well.    ASSESSMENT & PLAN:   Greater trochanteric pain syndrome of left lower extremity Completed shockwave therapy today.

## 2022-06-20 ENCOUNTER — Ambulatory Visit: Payer: Self-pay | Admitting: Family Medicine

## 2022-06-20 ENCOUNTER — Ambulatory Visit: Payer: Self-pay

## 2022-06-20 VITALS — BP 120/80 | Ht 60.0 in | Wt 156.0 lb

## 2022-06-20 DIAGNOSIS — M25552 Pain in left hip: Secondary | ICD-10-CM

## 2022-06-20 MED ORDER — TRIAMCINOLONE ACETONIDE 32 MG IX SRER
32.0000 mg | Freq: Once | INTRA_ARTICULAR | Status: AC
  Administered 2022-06-20: 32 mg via INTRA_ARTICULAR

## 2022-06-20 NOTE — Progress Notes (Signed)
Hayley Walters - 76 y.o. female MRN [id number]  Date of birth: 08/05/1946  SUBJECTIVE:  Including CC & ROS.  No chief complaint on file.   Hayley Walters is a 76 y.o. female that is here for an injection.   Review of Systems See HPI   HISTORY: Past Medical, Surgical, Social, and Family History Reviewed & Updated per EMR.   Pertinent Historical Findings include:  Past Medical History:  Diagnosis Date   Acute carpal tunnel syndrome of right wrist 07/21/2015   Anemia    Arthritis    Cancer (HCC)    Basal cell    Colon polyps    Depression    situational   Fracture of radius, distal, right, closed 07/21/2015   Headache    migraines   History of kidney stones    IBS (irritable bowel syndrome)    Non-alcoholic fatty liver disease    mild   Osteoporosis    Pneumonia    as a child   Pulmonary emboli ([place])     Past Surgical History:  Procedure Laterality Date   ABDOMINAL HYSTERECTOMY     BREAST SURGERY     augmentation   CARPAL TUNNEL RELEASE Right 07/21/2015   Procedure: RIGHT CARPAL TUNNEL RELEASE ;  Surgeon: Marchia Bond, MD;  Location: [place];  Service: Orthopedics;  Laterality: Right;   COLONOSCOPY     CYSTOSCOPY/URETEROSCOPY/HOLMIUM LASER/STENT PLACEMENT Left 12/05/2020   Procedure: CYSTOSCOPY/LEFT RETROGRADE PLYOGRAM/LEFT DIAGNOSTIC URETEROSCOPY AND STENT PLACEMENT;  Surgeon: Festus Aloe, MD;  [facility];  Service: Urology;  Laterality: Left;  ONLY NEEDS 60 MIN   CYSTOSCOPY/URETEROSCOPY/HOLMIUM LASER/STENT PLACEMENT Left 01/12/2021   Procedure: CYSTOSCOPY/RETROGRADE/URETEROSCOPY/HOLMIUM LASER/STENT PLACEMENT;  Surgeon: Festus Aloe, MD;  [facility];  Service: Urology;  Laterality: Left;  ONLY NEEDS 70 MIN   FRACTURE SURGERY Left    hip/femur   FRACTURE SURGERY Right    screws in right hip for stress fracture   OPEN REDUCTION INTERNAL FIXATION (ORIF) DISTAL RADIAL FRACTURE Right 07/21/2015   Procedure: OPEN REDUCTION INTERNAL  FIXATION (ORIF) RIGHT DISTAL RADIAL FRACTURE;  Surgeon: Marchia Bond, MD;  Location: [place];  Service: Orthopedics;  Laterality: Right;   TONSILLECTOMY       PHYSICAL EXAM:  VS: BP 120/80 (BP Location: Left Arm, Patient Position: Sitting)   Ht 5' (1.524 m)   Wt 156 lb (70.8 kg)   BMI 30.47 kg/m  Physical Exam Gen: NAD, alert, cooperative with exam, well-appearing MSK:  Neurovascularly intact     Aspiration/Injection Procedure Note Hayley Walters Jun 17, 1946  Procedure: Injection Indications: left hip pain   Procedure Details Consent: Risks of procedure as well as the alternatives and risks of each were explained to the (patient/caregiver).  Consent for procedure obtained. Time Out: Verified patient identification, verified procedure, site/side was marked, verified correct patient position, special equipment/implants available, medications/allergies/relevent history reviewed, required imaging and test results available.  Performed.  The area was cleaned with iodine and alcohol swabs.    The left trochanteric bursa was injected using 3 cc of 1% lidocaine on a 22-gauge 3-1/2 inch needle.  A The syringe was switched and a mixture containing 5 cc's of 32 mg Zilretta and 4 cc's of 0.25% bupivacaine was injected.  Ultrasound was used. Images were obtained in long views showing the injection.    A sterile dressing was applied.  Patient did tolerate procedure well.    ASSESSMENT & PLAN:   Greater trochanteric pain syndrome of left lower  extremity Completed Zilretta injection of the left hip today.

## 2022-06-20 NOTE — Assessment & Plan Note (Signed)
Completed Zilretta injection of the left hip today.

## 2022-06-25 DIAGNOSIS — Z961 Presence of intraocular lens: Secondary | ICD-10-CM | POA: Diagnosis not present

## 2022-06-25 DIAGNOSIS — H26493 Other secondary cataract, bilateral: Secondary | ICD-10-CM | POA: Diagnosis not present

## 2022-06-25 DIAGNOSIS — H35371 Puckering of macula, right eye: Secondary | ICD-10-CM | POA: Diagnosis not present

## 2022-06-25 DIAGNOSIS — H524 Presbyopia: Secondary | ICD-10-CM | POA: Diagnosis not present

## 2022-07-04 ENCOUNTER — Encounter: Payer: Self-pay | Admitting: Family Medicine

## 2022-07-04 ENCOUNTER — Ambulatory Visit: Payer: Self-pay | Admitting: Family Medicine

## 2022-07-04 DIAGNOSIS — M25552 Pain in left hip: Secondary | ICD-10-CM

## 2022-07-04 NOTE — Progress Notes (Signed)
  Hayley Walters - 76 y.o. female MRN [id number]  Date of birth: [birth year]  SUBJECTIVE:  Including CC & ROS.  No chief complaint on file.   Hayley Walters is a 76 y.o. female that is here for shockwave therapy.    Review of Systems See HPI   HISTORY: Past Medical, Surgical, Social, and Family History Reviewed & Updated per EMR.   Pertinent Historical Findings include:  Past Medical History:  Diagnosis Date   Acute carpal tunnel syndrome of right wrist 07/21/2015   Anemia    Arthritis    Cancer ([facility]CC)    Basal cell    Colon polyps    Depression    situational   Fracture of radius, distal, right, closed 07/21/2015   Headache    migraines   History of kidney stones    IBS (irritable bowel syndrome)    Non-alcoholic fatty liver disease    mild   Osteoporosis    Pneumonia    as a child   Pulmonary emboli ([place])     Past Surgical History:  Procedure Laterality Date   ABDOMINAL HYSTERECTOMY     BREAST SURGERY     augmentation   CARPAL TUNNEL RELEASE Right 07/21/2015   Procedure: RIGHT CARPAL TUNNEL RELEASE ;  Surgeon: Marchia Bond, MD;  Location: [place];  Service: Orthopedics;  Laterality: Right;   COLONOSCOPY     CYSTOSCOPY/URETEROSCOPY/HOLMIUM LASER/STENT PLACEMENT Left 12/05/2020   Procedure: CYSTOSCOPY/LEFT RETROGRADE PLYOGRAM/LEFT DIAGNOSTIC URETEROSCOPY AND STENT PLACEMENT;  Surgeon: Festus Aloe, MD;  [facility];  Service: Urology;  Laterality: Left;  ONLY NEEDS 60 MIN   CYSTOSCOPY/URETEROSCOPY/HOLMIUM LASER/STENT PLACEMENT Left 01/12/2021   Procedure: CYSTOSCOPY/RETROGRADE/URETEROSCOPY/HOLMIUM LASER/STENT PLACEMENT;  Surgeon: Festus Aloe, MD;  [facility];  Service: Urology;  Laterality: Left;  ONLY NEEDS 70 MIN   FRACTURE SURGERY Left    hip/femur   FRACTURE SURGERY Right    screws in right hip for stress fracture   OPEN REDUCTION INTERNAL FIXATION (ORIF) DISTAL RADIAL FRACTURE Right 07/21/2015   Procedure: OPEN REDUCTION INTERNAL  FIXATION (ORIF) RIGHT DISTAL RADIAL FRACTURE;  Surgeon: Marchia Bond, MD;  Location: [place];  Service: Orthopedics;  Laterality: Right;   TONSILLECTOMY       PHYSICAL EXAM:  VS: There were no vitals taken for this visit. Physical Exam Gen: NAD, alert, cooperative with exam, well-appearing MSK:  Neurovascularly intact    ECSWT Note Hayley Walters Jul 06, 1946  Procedure: ECSWT Indications: left hip pain   Procedure Details Consent: Risks of procedure as well as the alternatives and risks of each were explained to the (patient/caregiver).  Consent for procedure obtained. Time Out: Verified patient identification, verified procedure, site/side was marked, verified correct patient position, special equipment/implants available, medications/allergies/relevent history reviewed, required imaging and test results available.  Performed.  The area was cleaned with iodine and alcohol swabs.    The left hip was targeted for Extracorporeal shockwave therapy.   Preset: Trochanteric bursitis Power Level: 130 Frequency: 10 Impulse/cycles: 3000 Head size: Large Session: 19  Patient did tolerate procedure well.    ASSESSMENT & PLAN:   Greater trochanteric pain syndrome of left lower extremity Completed shockwave therapy.

## 2022-07-04 NOTE — Assessment & Plan Note (Signed)
Completed shockwave therapy  

## 2022-07-16 ENCOUNTER — Ambulatory Visit: Payer: Self-pay | Admitting: Family Medicine

## 2022-07-16 ENCOUNTER — Encounter: Payer: Self-pay | Admitting: Family Medicine

## 2022-07-16 DIAGNOSIS — M25552 Pain in left hip: Secondary | ICD-10-CM

## 2022-07-16 NOTE — Assessment & Plan Note (Signed)
Completed shockwave therapy  

## 2022-07-16 NOTE — Progress Notes (Signed)
  Hayley Walters - 76 y.o. female MRN [id number]  Date of birth: 06/24/1946  SUBJECTIVE:  Including CC & ROS.  No chief complaint on file.   Hayley Walters is a 76 y.o. female that is  here for shockwave therapy.    Review of Systems See HPI   HISTORY: Past Medical, Surgical, Social, and Family History Reviewed & Updated per EMR.   Pertinent Historical Findings include:  Past Medical History:  Diagnosis Date   Acute carpal tunnel syndrome of right wrist 07/21/2015   Anemia    Arthritis    Cancer (HCC)    Basal cell    Colon polyps    Depression    situational   Fracture of radius, distal, right, closed 07/21/2015   Headache    migraines   History of kidney stones    IBS (irritable bowel syndrome)    Non-alcoholic fatty liver disease    mild   Osteoporosis    Pneumonia    as a child   Pulmonary emboli ([place])     Past Surgical History:  Procedure Laterality Date   ABDOMINAL HYSTERECTOMY     BREAST SURGERY     augmentation   CARPAL TUNNEL RELEASE Right 07/21/2015   Procedure: RIGHT CARPAL TUNNEL RELEASE ;  Surgeon: Marchia Bond, MD;  Location: [place];  Service: Orthopedics;  Laterality: Right;   COLONOSCOPY     CYSTOSCOPY/URETEROSCOPY/HOLMIUM LASER/STENT PLACEMENT Left 12/05/2020   Procedure: CYSTOSCOPY/LEFT RETROGRADE PLYOGRAM/LEFT DIAGNOSTIC URETEROSCOPY AND STENT PLACEMENT;  Surgeon: Festus Aloe, MD;  [facility];  Service: Urology;  Laterality: Left;  ONLY NEEDS 60 MIN   CYSTOSCOPY/URETEROSCOPY/HOLMIUM LASER/STENT PLACEMENT Left 01/12/2021   Procedure: CYSTOSCOPY/RETROGRADE/URETEROSCOPY/HOLMIUM LASER/STENT PLACEMENT;  Surgeon: Festus Aloe, MD;  [facility];  Service: Urology;  Laterality: Left;  ONLY NEEDS 70 MIN   FRACTURE SURGERY Left    hip/femur   FRACTURE SURGERY Right    screws in right hip for stress fracture   OPEN REDUCTION INTERNAL FIXATION (ORIF) DISTAL RADIAL FRACTURE Right 07/21/2015   Procedure: OPEN REDUCTION  INTERNAL FIXATION (ORIF) RIGHT DISTAL RADIAL FRACTURE;  Surgeon: Marchia Bond, MD;  Location: [place];  Service: Orthopedics;  Laterality: Right;   TONSILLECTOMY       PHYSICAL EXAM:  VS: Ht 5' (1.524 m)   Wt 156 lb (70.8 kg)   BMI 30.47 kg/m  Physical Exam Gen: NAD, alert, cooperative with exam, well-appearing MSK:  Neurovascularly intact    ECSWT Note Hayley Walters 09/25/1946  Procedure: ECSWT Indications: left hip pain  Procedure Details Consent: Risks of procedure as well as the alternatives and risks of each were explained to the (patient/caregiver).  Consent for procedure obtained. Time Out: Verified patient identification, verified procedure, site/side was marked, verified correct patient position, special equipment/implants available, medications/allergies/relevent history reviewed, required imaging and test results available.  Performed.  The area was cleaned with iodine and alcohol swabs.    The left hip was targeted for Extracorporeal shockwave therapy.   Preset: trochanteric bursitis Power Level: 120 Frequency: 10 Impulse/cycles: 3200 Head size: large  Session: 20  Patient did tolerate procedure well.    ASSESSMENT & PLAN:   Greater trochanteric pain syndrome of left lower extremity Completed shockwave therapy

## 2022-07-23 ENCOUNTER — Other Ambulatory Visit: Payer: Self-pay | Admitting: Family Medicine

## 2022-07-26 DIAGNOSIS — K581 Irritable bowel syndrome with constipation: Secondary | ICD-10-CM | POA: Diagnosis not present

## 2022-07-26 DIAGNOSIS — R109 Unspecified abdominal pain: Secondary | ICD-10-CM | POA: Diagnosis not present

## 2022-08-02 ENCOUNTER — Ambulatory Visit: Payer: Self-pay | Admitting: Family Medicine

## 2022-08-02 ENCOUNTER — Encounter: Payer: Self-pay | Admitting: Family Medicine

## 2022-08-02 DIAGNOSIS — M25552 Pain in left hip: Secondary | ICD-10-CM

## 2022-08-02 NOTE — Assessment & Plan Note (Signed)
Completed shockwave therapy  

## 2022-08-02 NOTE — Progress Notes (Signed)
  Hayley Walters - 76 y.o. female MRN [id number]  Date of birth: Aug 10, 1946  SUBJECTIVE:  Including CC & ROS.  No chief complaint on file.   Hayley Walters is a 76 y.o. female that is here for shockwave therapy.    Review of Systems See HPI   HISTORY: Past Medical, Surgical, Social, and Family History Reviewed & Updated per EMR.   Pertinent Historical Findings include:  Past Medical History:  Diagnosis Date   Acute carpal tunnel syndrome of right wrist 07/21/2015   Anemia    Arthritis    Cancer (HCC)    Basal cell    Colon polyps    Depression    situational   Fracture of radius, distal, right, closed 07/21/2015   Headache    migraines   History of kidney stones    IBS (irritable bowel syndrome)    Non-alcoholic fatty liver disease    mild   Osteoporosis    Pneumonia    as a child   Pulmonary emboli ([place])     Past Surgical History:  Procedure Laterality Date   ABDOMINAL HYSTERECTOMY     BREAST SURGERY     augmentation   CARPAL TUNNEL RELEASE Right 07/21/2015   Procedure: RIGHT CARPAL TUNNEL RELEASE ;  Surgeon: Marchia Bond, MD;  Location: [place];  Service: Orthopedics;  Laterality: Right;   COLONOSCOPY     CYSTOSCOPY/URETEROSCOPY/HOLMIUM LASER/STENT PLACEMENT Left 12/05/2020   Procedure: CYSTOSCOPY/LEFT RETROGRADE PLYOGRAM/LEFT DIAGNOSTIC URETEROSCOPY AND STENT PLACEMENT;  Surgeon: Festus Aloe, MD;  [facility];  Service: Urology;  Laterality: Left;  ONLY NEEDS 60 MIN   CYSTOSCOPY/URETEROSCOPY/HOLMIUM LASER/STENT PLACEMENT Left 01/12/2021   Procedure: CYSTOSCOPY/RETROGRADE/URETEROSCOPY/HOLMIUM LASER/STENT PLACEMENT;  Surgeon: Festus Aloe, MD;  [facility];  Service: Urology;  Laterality: Left;  ONLY NEEDS 70 MIN   FRACTURE SURGERY Left    hip/femur   FRACTURE SURGERY Right    screws in right hip for stress fracture   OPEN REDUCTION INTERNAL FIXATION (ORIF) DISTAL RADIAL FRACTURE Right 07/21/2015   Procedure: OPEN REDUCTION INTERNAL  FIXATION (ORIF) RIGHT DISTAL RADIAL FRACTURE;  Surgeon: Marchia Bond, MD;  Location: [place];  Service: Orthopedics;  Laterality: Right;   TONSILLECTOMY       PHYSICAL EXAM:  VS: Ht 5' (1.524 m)   Wt 156 lb (70.8 kg)   BMI 30.47 kg/m  Physical Exam Gen: NAD, alert, cooperative with exam, well-appearing MSK:  Neurovascularly intact    ECSWT Note Hayley Walters 19-May-1946  Procedure: ECSWT Indications: left hip pain  Procedure Details Consent: Risks of procedure as well as the alternatives and risks of each were explained to the (patient/caregiver).  Consent for procedure obtained. Time Out: Verified patient identification, verified procedure, site/side was marked, verified correct patient position, special equipment/implants available, medications/allergies/relevent history reviewed, required imaging and test results available.  Performed.  The area was cleaned with iodine and alcohol swabs.    The left hip was targeted for Extracorporeal shockwave therapy.   Preset: trochanteric bursitis Power Level: 120 Frequency: 10 Impulse/cycles: 3500 Head size: large  Session: 21  Patient did tolerate procedure well.    ASSESSMENT & PLAN:   Greater trochanteric pain syndrome of left lower extremity Completed shockwave therapy

## 2022-08-06 NOTE — Telephone Encounter (Addendum)
Last Prolia inj 06/06/22 Next Prolia inj due 12/08/22

## 2022-08-22 ENCOUNTER — Encounter: Payer: Self-pay | Admitting: Family Medicine

## 2022-08-22 ENCOUNTER — Ambulatory Visit: Payer: Self-pay | Admitting: Family Medicine

## 2022-08-22 DIAGNOSIS — M25552 Pain in left hip: Secondary | ICD-10-CM

## 2022-08-22 NOTE — Assessment & Plan Note (Signed)
Completed shockwave therapy  

## 2022-08-22 NOTE — Progress Notes (Signed)
  Hayley Walters - 76 y.o. female MRN [id number]  Date of birth: 01-07-1946  SUBJECTIVE:  Including CC & ROS.  No chief complaint on file.   Hayley Walters is a 76 y.o. female that is  here for shockwave therapy.    Review of Systems See HPI   HISTORY: Past Medical, Surgical, Social, and Family History Reviewed & Updated per EMR.   Pertinent Historical Findings include:  Past Medical History:  Diagnosis Date   Acute carpal tunnel syndrome of right wrist 07/21/2015   Anemia    Arthritis    Cancer (HCC)    Basal cell    Colon polyps    Depression    situational   Fracture of radius, distal, right, closed 07/21/2015   Headache    migraines   History of kidney stones    IBS (irritable bowel syndrome)    Non-alcoholic fatty liver disease    mild   Osteoporosis    Pneumonia    as a child   Pulmonary emboli ([place])     Past Surgical History:  Procedure Laterality Date   ABDOMINAL HYSTERECTOMY     BREAST SURGERY     augmentation   CARPAL TUNNEL RELEASE Right 07/21/2015   Procedure: RIGHT CARPAL TUNNEL RELEASE ;  Surgeon: Marchia Bond, MD;  Location: [facility];  Service: Orthopedics;  Laterality: Right;   COLONOSCOPY     CYSTOSCOPY/URETEROSCOPY/HOLMIUM LASER/STENT PLACEMENT Left 12/05/2020   Procedure: CYSTOSCOPY/LEFT RETROGRADE PLYOGRAM/LEFT DIAGNOSTIC URETEROSCOPY AND STENT PLACEMENT;  Surgeon: Festus Aloe, MD;  [facility];  Service: Urology;  Laterality: Left;  ONLY NEEDS 60 MIN   CYSTOSCOPY/URETEROSCOPY/HOLMIUM LASER/STENT PLACEMENT Left 01/12/2021   Procedure: CYSTOSCOPY/RETROGRADE/URETEROSCOPY/HOLMIUM LASER/STENT PLACEMENT;  Surgeon: Festus Aloe, MD;  [facility];  Service: Urology;  Laterality: Left;  ONLY NEEDS 70 MIN   FRACTURE SURGERY Left    hip/femur   FRACTURE SURGERY Right    screws in right hip for stress fracture   OPEN REDUCTION INTERNAL FIXATION (ORIF) DISTAL RADIAL FRACTURE Right 07/21/2015   Procedure: OPEN REDUCTION  INTERNAL FIXATION (ORIF) RIGHT DISTAL RADIAL FRACTURE;  Surgeon: Marchia Bond, MD;  Location: [place];  Service: Orthopedics;  Laterality: Right;   TONSILLECTOMY       PHYSICAL EXAM:  VS: Ht 5' (1.524 m)   Wt 156 lb (70.8 kg)   BMI 30.47 kg/m  Physical Exam Gen: NAD, alert, cooperative with exam, well-appearing MSK:  Neurovascularly intact    ECSWT Note Hayley Walters [birth year]  Procedure: ECSWT Indications: left hip pain  Procedure Details Consent: Risks of procedure as well as the alternatives and risks of each were explained to the (patient/caregiver).  Consent for procedure obtained. Time Out: Verified patient identification, verified procedure, site/side was marked, verified correct patient position, special equipment/implants available, medications/allergies/relevent history reviewed, required imaging and test results available.  Performed.  The area was cleaned with iodine and alcohol swabs.    The left hip was targeted for Extracorporeal shockwave therapy.   Preset: trochanteric bursitis  Power Level: 130 Frequency: 10 Impulse/cycles: 3500 Head size: large   Session: 22  Patient did tolerate procedure well.    ASSESSMENT & PLAN:   Greater trochanteric pain syndrome of left lower extremity Completed shockwave therapy

## 2022-09-03 DIAGNOSIS — Z961 Presence of intraocular lens: Secondary | ICD-10-CM | POA: Diagnosis not present

## 2022-09-03 DIAGNOSIS — H26492 Other secondary cataract, left eye: Secondary | ICD-10-CM | POA: Diagnosis not present

## 2022-09-03 DIAGNOSIS — H35371 Puckering of macula, right eye: Secondary | ICD-10-CM | POA: Diagnosis not present

## 2022-09-03 DIAGNOSIS — H18413 Arcus senilis, bilateral: Secondary | ICD-10-CM | POA: Diagnosis not present

## 2022-09-03 DIAGNOSIS — H26491 Other secondary cataract, right eye: Secondary | ICD-10-CM | POA: Diagnosis not present

## 2022-09-03 DIAGNOSIS — H26493 Other secondary cataract, bilateral: Secondary | ICD-10-CM | POA: Diagnosis not present

## 2022-09-05 ENCOUNTER — Ambulatory Visit (INDEPENDENT_AMBULATORY_CARE_PROVIDER_SITE_OTHER): Payer: PPO | Admitting: Family Medicine

## 2022-09-05 ENCOUNTER — Encounter: Payer: Self-pay | Admitting: Family Medicine

## 2022-09-05 ENCOUNTER — Ambulatory Visit: Payer: Self-pay

## 2022-09-05 VITALS — Ht 60.0 in | Wt 156.0 lb

## 2022-09-05 DIAGNOSIS — M25552 Pain in left hip: Secondary | ICD-10-CM

## 2022-09-05 NOTE — Assessment & Plan Note (Signed)
Completed zilretta injection.  ?

## 2022-09-05 NOTE — Progress Notes (Signed)
Hayley Walters - 76 y.o. female MRN [id number]  Date of birth: 1946/02/02  SUBJECTIVE:  Including CC & ROS.  No chief complaint on file.   Hayley Walters is a 76 y.o. female that is  here for zilretta injection.    Review of Systems See HPI   HISTORY: Past Medical, Surgical, Social, and Family History Reviewed & Updated per EMR.   Pertinent Historical Findings include:  Past Medical History:  Diagnosis Date   Acute carpal tunnel syndrome of right wrist 07/21/2015   Anemia    Arthritis    Cancer (HCC)    Basal cell    Colon polyps    Depression    situational   Fracture of radius, distal, right, closed 07/21/2015   Headache    migraines   History of kidney stones    IBS (irritable bowel syndrome)    Non-alcoholic fatty liver disease    mild   Osteoporosis    Pneumonia    as a child   Pulmonary emboli ([place])     Past Surgical History:  Procedure Laterality Date   ABDOMINAL HYSTERECTOMY     BREAST SURGERY     augmentation   CARPAL TUNNEL RELEASE Right 07/21/2015   Procedure: RIGHT CARPAL TUNNEL RELEASE ;  Surgeon: Marchia Bond, MD;  Location: [place];  Service: Orthopedics;  Laterality: Right;   COLONOSCOPY     CYSTOSCOPY/URETEROSCOPY/HOLMIUM LASER/STENT PLACEMENT Left 12/05/2020   Procedure: CYSTOSCOPY/LEFT RETROGRADE PLYOGRAM/LEFT DIAGNOSTIC URETEROSCOPY AND STENT PLACEMENT;  Surgeon: Festus Aloe, MD;  [facility];  Service: Urology;  Laterality: Left;  ONLY NEEDS 60 MIN   CYSTOSCOPY/URETEROSCOPY/HOLMIUM LASER/STENT PLACEMENT Left 01/12/2021   Procedure: CYSTOSCOPY/RETROGRADE/URETEROSCOPY/HOLMIUM LASER/STENT PLACEMENT;  Surgeon: Festus Aloe, MD;  [facility];  Service: Urology;  Laterality: Left;  ONLY NEEDS 70 MIN   FRACTURE SURGERY Left    hip/femur   FRACTURE SURGERY Right    screws in right hip for stress fracture   OPEN REDUCTION INTERNAL FIXATION (ORIF) DISTAL RADIAL FRACTURE Right 07/21/2015   Procedure: OPEN REDUCTION  INTERNAL FIXATION (ORIF) RIGHT DISTAL RADIAL FRACTURE;  Surgeon: Marchia Bond, MD;  Location: [place];  Service: Orthopedics;  Laterality: Right;   TONSILLECTOMY       PHYSICAL EXAM:  VS: Ht 5' (1.524 m)   Wt 156 lb (70.8 kg)   BMI 30.47 kg/m  Physical Exam Gen: NAD, alert, cooperative with exam, well-appearing MSK:  Neurovascularly intact     Aspiration/Injection Procedure Note Hayley Walters 1946/05/16  Procedure: Injection Indications: left hip pain   Procedure Details Consent: Risks of procedure as well as the alternatives and risks of each were explained to the (patient/caregiver).  Consent for procedure obtained. Time Out: Verified patient identification, verified procedure, site/side was marked, verified correct patient position, special equipment/implants available, medications/allergies/relevent history reviewed, required imaging and test results available.  Performed.  The area was cleaned with iodine and alcohol swabs.    The left trochanteric bursa was injected using 3 cc of 1% lidocaine on a 22-gauge 3-1/2 inch needle.  The syringe was switched and a mixture containing 5 cc's of 32 mg Zilretta and 4 cc's of 0.25% bupivacaine was injected.  Ultrasound was used. Images were obtained in long views showing the injection.    A sterile dressing was applied.  Patient did tolerate procedure well.       ASSESSMENT & PLAN:   Greater trochanteric pain syndrome of left lower extremity Completed zilretta injection.

## 2022-09-05 NOTE — Patient Instructions (Signed)
Good to see you Please use ice as needed   Please send me a message in MyChart with any questions or updates.  Please see me back for shockwave therapy.   --Dr. Raeford Razor

## 2022-09-10 DIAGNOSIS — H26491 Other secondary cataract, right eye: Secondary | ICD-10-CM | POA: Diagnosis not present

## 2022-09-10 DIAGNOSIS — Z961 Presence of intraocular lens: Secondary | ICD-10-CM | POA: Diagnosis not present

## 2022-09-10 DIAGNOSIS — H35371 Puckering of macula, right eye: Secondary | ICD-10-CM | POA: Diagnosis not present

## 2022-09-17 DIAGNOSIS — H26491 Other secondary cataract, right eye: Secondary | ICD-10-CM | POA: Diagnosis not present

## 2022-09-24 ENCOUNTER — Ambulatory Visit: Payer: Self-pay | Admitting: Family Medicine

## 2022-09-24 ENCOUNTER — Ambulatory Visit (HOSPITAL_BASED_OUTPATIENT_CLINIC_OR_DEPARTMENT_OTHER)
Admission: RE | Admit: 2022-09-24 | Discharge: 2022-09-24 | Disposition: A | Discharge status: Home / Self Care | Payer: PPO | Source: Ambulatory Visit | Attending: Family Medicine | Admitting: Family Medicine

## 2022-09-24 ENCOUNTER — Ambulatory Visit: Payer: Self-pay

## 2022-09-24 ENCOUNTER — Encounter: Payer: Self-pay | Admitting: Family Medicine

## 2022-09-24 ENCOUNTER — Ambulatory Visit (INDEPENDENT_AMBULATORY_CARE_PROVIDER_SITE_OTHER): Payer: PPO | Admitting: Family Medicine

## 2022-09-24 VITALS — Ht 60.0 in | Wt 156.0 lb

## 2022-09-24 DIAGNOSIS — M25551 Pain in right hip: Secondary | ICD-10-CM | POA: Insufficient documentation

## 2022-09-24 DIAGNOSIS — R102 Pelvic and perineal pain: Secondary | ICD-10-CM | POA: Diagnosis not present

## 2022-09-24 DIAGNOSIS — M25552 Pain in left hip: Secondary | ICD-10-CM

## 2022-09-24 MED ORDER — TRIAMCINOLONE ACETONIDE 40 MG/ML IJ SUSP
40.0000 mg | Freq: Once | INTRAMUSCULAR | Status: AC
  Administered 2022-09-24: 40 mg via INTRA_ARTICULAR

## 2022-09-24 NOTE — Progress Notes (Signed)
Hayley Walters - 76 y.o. female MRN [id number]  Date of birth: 1946/07/07  SUBJECTIVE:  Including CC & ROS.  No chief complaint on file.   Hayley Walters is a 76 y.o. female that is presenting with acute right hip pain.  The pain is localized to the lateral hip but does get radiation distally.  No new or different activities.  Pain is similar to previous pain.    Review of Systems See HPI   HISTORY: Past Medical, Surgical, Social, and Family History Reviewed & Updated per EMR.   Pertinent Historical Findings include:  Past Medical History:  Diagnosis Date   Acute carpal tunnel syndrome of right wrist 07/21/2015   Anemia    Arthritis    Cancer (HCC)    Basal cell    Colon polyps    Depression    situational   Fracture of radius, distal, right, closed 07/21/2015   Headache    migraines   History of kidney stones    IBS (irritable bowel syndrome)    Non-alcoholic fatty liver disease    mild   Osteoporosis    Pneumonia    as a child   Pulmonary emboli ([place])     Past Surgical History:  Procedure Laterality Date   ABDOMINAL HYSTERECTOMY     BREAST SURGERY     augmentation   CARPAL TUNNEL RELEASE Right 07/21/2015   Procedure: RIGHT CARPAL TUNNEL RELEASE ;  Surgeon: Marchia Bond, MD;  Location: [facility];  Service: Orthopedics;  Laterality: Right;   COLONOSCOPY     CYSTOSCOPY/URETEROSCOPY/HOLMIUM LASER/STENT PLACEMENT Left 12/05/2020   Procedure: CYSTOSCOPY/LEFT RETROGRADE PLYOGRAM/LEFT DIAGNOSTIC URETEROSCOPY AND STENT PLACEMENT;  Surgeon: Festus Aloe, MD;  [facility];  Service: Urology;  Laterality: Left;  ONLY NEEDS 60 MIN   CYSTOSCOPY/URETEROSCOPY/HOLMIUM LASER/STENT PLACEMENT Left 01/12/2021   Procedure: CYSTOSCOPY/RETROGRADE/URETEROSCOPY/HOLMIUM LASER/STENT PLACEMENT;  Surgeon: Festus Aloe, MD;  [facility];  Service: Urology;  Laterality: Left;  ONLY NEEDS 70 MIN   FRACTURE SURGERY Left    hip/femur   FRACTURE SURGERY Right    screws  in right hip for stress fracture   OPEN REDUCTION INTERNAL FIXATION (ORIF) DISTAL RADIAL FRACTURE Right 07/21/2015   Procedure: OPEN REDUCTION INTERNAL FIXATION (ORIF) RIGHT DISTAL RADIAL FRACTURE;  Surgeon: Marchia Bond, MD;  Location: [place];  Service: Orthopedics;  Laterality: Right;   TONSILLECTOMY       PHYSICAL EXAM:  VS: Ht 5' (1.524 m)   Wt 156 lb (70.8 kg)   BMI 30.47 kg/m  Physical Exam Gen: NAD, alert, cooperative with exam, well-appearing MSK:  Neurovascularly intact     Aspiration/Injection Procedure Note Hayley Walters 04/30/1946  Procedure: Injection Indications: Right hip pain  Procedure Details Consent: Risks of procedure as well as the alternatives and risks of each were explained to the (patient/caregiver).  Consent for procedure obtained. Time Out: Verified patient identification, verified procedure, site/side was marked, verified correct patient position, special equipment/implants available, medications/allergies/relevent history reviewed, required imaging and test results available.  Performed.  The area was cleaned with iodine and alcohol swabs.    The right lateral hip was injected using 3 cc of 1% lidocaine on a 22-gauge 3-1/2 inch needle.  The syringe was switched and a mixture containing 1 cc's of 40 m Depo-Medrol and 4 cc's of 0.25% bupivacaine was injected.  Ultrasound was used. Images were obtained in short views showing the injection.     A sterile dressing was applied.  Patient did  tolerate procedure well.     ASSESSMENT & PLAN:   Greater trochanteric pain syndrome of right lower extremity Acutely worsening of the right lateral hip pain.  Similar to her previous type pain. -Counseled on home exercise therapy and supportive care. -Injection today. -X-ray. -Could consider physical therapy.

## 2022-09-24 NOTE — Patient Instructions (Signed)
Good to see you  Please use ice as needed  I will call with the results.  Please send me a message in MyChart with any questions or updates.  Please see me back as scheduled.   --Dr. Raeford Razor

## 2022-09-24 NOTE — Assessment & Plan Note (Signed)
Completed shockwave therapy  

## 2022-09-24 NOTE — Assessment & Plan Note (Signed)
Acutely worsening of the right lateral hip pain.  Similar to her previous type pain. -Counseled on home exercise therapy and supportive care. -Injection today. -X-ray. -Could consider physical therapy.

## 2022-09-24 NOTE — Progress Notes (Signed)
  Hayley Walters - 76 y.o. female MRN [id number]  Date of birth: 02-21-1946  SUBJECTIVE:  Including CC & ROS.  No chief complaint on file.   Hayley Walters is a 76 y.o. female that is here for shockwave therapy.    Review of Systems See HPI   HISTORY: Past Medical, Surgical, Social, and Family History Reviewed & Updated per EMR.   Pertinent Historical Findings include:  Past Medical History:  Diagnosis Date   Acute carpal tunnel syndrome of right wrist 07/21/2015   Anemia    Arthritis    Cancer (HCC)    Basal cell    Colon polyps    Depression    situational   Fracture of radius, distal, right, closed 07/21/2015   Headache    migraines   History of kidney stones    IBS (irritable bowel syndrome)    Non-alcoholic fatty liver disease    mild   Osteoporosis    Pneumonia    as a child   Pulmonary emboli ([place])     Past Surgical History:  Procedure Laterality Date   ABDOMINAL HYSTERECTOMY     BREAST SURGERY     augmentation   CARPAL TUNNEL RELEASE Right 07/21/2015   Procedure: RIGHT CARPAL TUNNEL RELEASE ;  Surgeon: Marchia Bond, MD;  Location: [facility] [place];  Service: Orthopedics;  Laterality: Right;   COLONOSCOPY     CYSTOSCOPY/URETEROSCOPY/HOLMIUM LASER/STENT PLACEMENT Left 12/05/2020   Procedure: CYSTOSCOPY/LEFT RETROGRADE PLYOGRAM/LEFT DIAGNOSTIC URETEROSCOPY AND STENT PLACEMENT;  Surgeon: Festus Aloe, MD;  [facility];  Service: Urology;  Laterality: Left;  ONLY NEEDS 60 MIN   CYSTOSCOPY/URETEROSCOPY/HOLMIUM LASER/STENT PLACEMENT Left 01/12/2021   Procedure: CYSTOSCOPY/RETROGRADE/URETEROSCOPY/HOLMIUM LASER/STENT PLACEMENT;  Surgeon: Festus Aloe, MD;  [facility];  Service: Urology;  Laterality: Left;  ONLY NEEDS 70 MIN   FRACTURE SURGERY Left    hip/femur   FRACTURE SURGERY Right    screws in right hip for stress fracture   OPEN REDUCTION INTERNAL FIXATION (ORIF) DISTAL RADIAL FRACTURE Right 07/21/2015   Procedure: OPEN REDUCTION INTERNAL  FIXATION (ORIF) RIGHT DISTAL RADIAL FRACTURE;  Surgeon: Marchia Bond, MD;  Location: [place];  Service: Orthopedics;  Laterality: Right;   TONSILLECTOMY       PHYSICAL EXAM:  VS: Ht 5' (1.524 m)   Wt 156 lb (70.8 kg)   BMI 30.47 kg/m  Physical Exam Gen: NAD, alert, cooperative with exam, well-appearing MSK:  Neurovascularly intact    ECSWT Note MAYETTA Walters 10-13-1946  Procedure: ECSWT Indications: Left hip pain  Procedure Details Consent: Risks of procedure as well as the alternatives and risks of each were explained to the (patient/caregiver).  Consent for procedure obtained. Time Out: Verified patient identification, verified procedure, site/side was marked, verified correct patient position, special equipment/implants available, medications/allergies/relevent history reviewed, required imaging and test results available.  Performed.  The area was cleaned with iodine and alcohol swabs.    The left hip was targeted for Extracorporeal shockwave therapy.   Preset: Trochanteric bursitis Power Level: 120 Frequency: 8 Impulse/cycles: 4000 Head size: medium  Session: 23  Patient did tolerate procedure well.    ASSESSMENT & PLAN:   Greater trochanteric pain syndrome of left lower extremity Completed shockwave therapy

## 2022-11-06 ENCOUNTER — Ambulatory Visit: Payer: Self-pay | Admitting: Family Medicine

## 2022-11-06 ENCOUNTER — Encounter: Payer: Self-pay | Admitting: Family Medicine

## 2022-11-06 VITALS — Ht 60.0 in | Wt 156.0 lb

## 2022-11-06 DIAGNOSIS — M25551 Pain in right hip: Secondary | ICD-10-CM

## 2022-11-06 DIAGNOSIS — M25552 Pain in left hip: Secondary | ICD-10-CM

## 2022-11-06 NOTE — Assessment & Plan Note (Signed)
Completed shockwave therapy  

## 2022-11-06 NOTE — Progress Notes (Signed)
  Hayley Walters - 76 y.o. female MRN [id number]  Date of birth: [birth year]  SUBJECTIVE:  Including CC & ROS.  No chief complaint on file.   Hayley Walters is a 76 y.o. female that is here for shockwave therapy.    Review of Systems See HPI   HISTORY: Past Medical, Surgical, Social, and Family History Reviewed & Updated per EMR.   Pertinent Historical Findings include:  Past Medical History:  Diagnosis Date   Acute carpal tunnel syndrome of right wrist 07/21/2015   Anemia    Arthritis    Cancer (HCC)    Basal cell    Colon polyps    Depression    situational   Fracture of radius, distal, right, closed 07/21/2015   Headache    migraines   History of kidney stones    IBS (irritable bowel syndrome)    Non-alcoholic fatty liver disease    mild   Osteoporosis    Pneumonia    as a child   Pulmonary emboli ([place])     Past Surgical History:  Procedure Laterality Date   ABDOMINAL HYSTERECTOMY     BREAST SURGERY     augmentation   CARPAL TUNNEL RELEASE Right 07/21/2015   Procedure: RIGHT CARPAL TUNNEL RELEASE ;  Surgeon: Marchia Bond, MD;  Location: [facility];  Service: Orthopedics;  Laterality: Right;   COLONOSCOPY     CYSTOSCOPY/URETEROSCOPY/HOLMIUM LASER/STENT PLACEMENT Left 12/05/2020   Procedure: CYSTOSCOPY/LEFT RETROGRADE PLYOGRAM/LEFT DIAGNOSTIC URETEROSCOPY AND STENT PLACEMENT;  Surgeon: Festus Aloe, MD;  [facility];  Service: Urology;  Laterality: Left;  ONLY NEEDS 60 MIN   CYSTOSCOPY/URETEROSCOPY/HOLMIUM LASER/STENT PLACEMENT Left 01/12/2021   Procedure: CYSTOSCOPY/RETROGRADE/URETEROSCOPY/HOLMIUM LASER/STENT PLACEMENT;  Surgeon: Festus Aloe, MD;  [facility];  Service: Urology;  Laterality: Left;  ONLY NEEDS 70 MIN   FRACTURE SURGERY Left    hip/femur   FRACTURE SURGERY Right    screws in right hip for stress fracture   OPEN REDUCTION INTERNAL FIXATION (ORIF) DISTAL RADIAL FRACTURE Right 07/21/2015   Procedure: OPEN REDUCTION INTERNAL  FIXATION (ORIF) RIGHT DISTAL RADIAL FRACTURE;  Surgeon: Marchia Bond, MD;  Location: [place];  Service: Orthopedics;  Laterality: Right;   TONSILLECTOMY       PHYSICAL EXAM:  VS: Ht 5' (1.524 m)   Wt 156 lb (70.8 kg)   BMI 30.47 kg/m  Physical Exam Gen: NAD, alert, cooperative with exam, well-appearing MSK:  Neurovascularly intact    ECSWT Note Hayley Walters 01-Nov-1946  Procedure: ECSWT Indications: right and left hip pain  Procedure Details Consent: Risks of procedure as well as the alternatives and risks of each were explained to the (patient/caregiver).  Consent for procedure obtained. Time Out: Verified patient identification, verified procedure, site/side was marked, verified correct patient position, special equipment/implants available, medications/allergies/relevent history reviewed, required imaging and test results available.  Performed.  The area was cleaned with iodine and alcohol swabs.    The right and left hip was targeted for Extracorporeal shockwave therapy.   Preset: Trochanteric bursitis Power Level: 100 Frequency: 8 Impulse/cycles: 4500 Head size: Large Session: 24  Patient did tolerate procedure well.    ASSESSMENT & PLAN:   Greater trochanteric pain syndrome of right lower extremity Completed shockwave therapy   Greater trochanteric pain syndrome of left lower extremity Completed shockwave therapy

## 2022-11-07 NOTE — Telephone Encounter (Signed)
Prolia VOB initiated via [email or web address]  Last Prolia inj 06/06/22 Next Prolia inj due 12/08/22

## 2022-11-12 NOTE — Telephone Encounter (Signed)
Pt ready for scheduling on or after 12/08/22   Out-of-pocket cost due at time of visit: $301   Primary: [employer] Adv Prolia co-insurance: 20% (approximately $276) Admin fee co-insurance: 20% (approximately $25)   Secondary: n/a Prolia co-insurance:  Admin fee co-insurance:    Deductible: does not apply   Prior Auth: not required PA# Valid:    ** This summary of benefits is an estimation of the patient's out-of-pocket cost. Exact cost may vary based on individual plan coverage.

## 2022-11-14 ENCOUNTER — Other Ambulatory Visit: Payer: Self-pay | Admitting: Family Medicine

## 2022-11-20 ENCOUNTER — Ambulatory Visit: Payer: PPO | Admitting: Family Medicine

## 2022-11-29 ENCOUNTER — Ambulatory Visit (INDEPENDENT_AMBULATORY_CARE_PROVIDER_SITE_OTHER): Payer: PPO | Admitting: Family Medicine

## 2022-11-29 ENCOUNTER — Encounter: Payer: Self-pay | Admitting: Family Medicine

## 2022-11-29 VITALS — BP 120/82 | Ht 60.0 in | Wt 145.0 lb

## 2022-11-29 DIAGNOSIS — M5416 Radiculopathy, lumbar region: Secondary | ICD-10-CM | POA: Diagnosis not present

## 2022-11-29 DIAGNOSIS — M25551 Pain in right hip: Secondary | ICD-10-CM | POA: Diagnosis not present

## 2022-11-29 MED ORDER — METHYLPREDNISOLONE ACETATE 40 MG/ML IJ SUSP
40.0000 mg | Freq: Once | INTRAMUSCULAR | Status: AC
  Administered 2022-11-29: 40 mg via INTRAMUSCULAR

## 2022-11-29 MED ORDER — HYDROCODONE-ACETAMINOPHEN 5-325 MG PO TABS: 1.0000 | ORAL_TABLET | Freq: Three times a day (TID) | ORAL | 0 refills | Status: DC | PRN

## 2022-11-29 NOTE — Patient Instructions (Signed)
Good to see you Please try heat on the back  Please continue with the water aerobics  Please take the pain medicine as needed   Please send me a message in MyChart with any questions or updates.  Please see me back in 2-3 weeks.   --Dr. Raeford Razor

## 2022-11-29 NOTE — Progress Notes (Signed)
  Hayley Walters - 76 y.o. female MRN [id number]  Date of birth: 05-02-1946  SUBJECTIVE:  Including CC & ROS.  No chief complaint on file.   Hayley Walters is a 76 y.o. female that is presenting with bilateral hip pain as well as lower extremity pain.  Seems to be worse when she lies on her back and the pain is more severe.  No injury or inciting event.  She is losing sleep at night.   Review of Systems See HPI   HISTORY: Past Medical, Surgical, Social, and Family History Reviewed & Updated per EMR.   Pertinent Historical Findings include:  Past Medical History:  Diagnosis Date   Acute carpal tunnel syndrome of right wrist 07/21/2015   Anemia    Arthritis    Cancer (HCC)    Basal cell    Colon polyps    Depression    situational   Fracture of radius, distal, right, closed 07/21/2015   Headache    migraines   History of kidney stones    IBS (irritable bowel syndrome)    Non-alcoholic fatty liver disease    mild   Osteoporosis    Pneumonia    as a child   Pulmonary emboli ([place])     Past Surgical History:  Procedure Laterality Date   ABDOMINAL HYSTERECTOMY     BREAST SURGERY     augmentation   CARPAL TUNNEL RELEASE Right 07/21/2015   Procedure: RIGHT CARPAL TUNNEL RELEASE ;  Surgeon: Marchia Bond, MD;  Location: [place];  Service: Orthopedics;  Laterality: Right;   COLONOSCOPY     CYSTOSCOPY/URETEROSCOPY/HOLMIUM LASER/STENT PLACEMENT Left 12/05/2020   Procedure: CYSTOSCOPY/LEFT RETROGRADE PLYOGRAM/LEFT DIAGNOSTIC URETEROSCOPY AND STENT PLACEMENT;  Surgeon: Festus Aloe, MD;  Location: [facility];  Service: Urology;  Laterality: Left;  ONLY NEEDS 60 MIN   CYSTOSCOPY/URETEROSCOPY/HOLMIUM LASER/STENT PLACEMENT Left 01/12/2021   Procedure: CYSTOSCOPY/RETROGRADE/URETEROSCOPY/HOLMIUM LASER/STENT PLACEMENT;  Surgeon: Festus Aloe, MD;  Location: [facility];  Service: Urology;  Laterality: Left;  ONLY NEEDS 70 MIN   FRACTURE SURGERY Left    hip/femur   FRACTURE  SURGERY Right    screws in right hip for stress fracture   OPEN REDUCTION INTERNAL FIXATION (ORIF) DISTAL RADIAL FRACTURE Right 07/21/2015   Procedure: OPEN REDUCTION INTERNAL FIXATION (ORIF) RIGHT DISTAL RADIAL FRACTURE;  Surgeon: Marchia Bond, MD;  Location: [place];  Service: Orthopedics;  Laterality: Right;   TONSILLECTOMY       PHYSICAL EXAM:  VS: BP 120/82   Ht 5' (1.524 m)   Wt 145 lb (65.8 kg)   BMI 28.32 kg/m  Physical Exam Gen: NAD, alert, cooperative with exam, well-appearing MSK:  Neurovascularly intact       ASSESSMENT & PLAN:   Lumbar radiculopathy Acutely occurring.  Does appear more radicular in nature given the severity of her pain -Counseled on home exercise therapy and supportive care. -IM Depo-Medrol. -Norco. -Could consider physical therapy or further imaging  Greater trochanteric pain syndrome of right lower extremity She does have acute pain over the lateral hip but more severe than normal  -Counseled on home exercise therapy and supportive care. -Could consider physical therapy

## 2022-11-29 NOTE — Assessment & Plan Note (Signed)
Acutely occurring.  Does appear more radicular in nature given the severity of her pain -Counseled on home exercise therapy and supportive care. -IM Depo-Medrol. -Norco. -Could consider physical therapy or further imaging

## 2022-11-29 NOTE — Assessment & Plan Note (Signed)
She does have acute pain over the lateral hip but more severe than normal  -Counseled on home exercise therapy and supportive care. -Could consider physical therapy

## 2022-12-04 NOTE — Telephone Encounter (Signed)
Pt informed of below at 11/29/22 OV.

## 2022-12-16 NOTE — Telephone Encounter (Signed)
Appt 12/17/22 with Dr. Raeford Razor

## 2022-12-17 ENCOUNTER — Encounter: Payer: Self-pay | Admitting: Family Medicine

## 2022-12-17 ENCOUNTER — Ambulatory Visit (INDEPENDENT_AMBULATORY_CARE_PROVIDER_SITE_OTHER): Payer: PPO | Admitting: Family Medicine

## 2022-12-17 VITALS — BP 138/86 | Ht 60.0 in | Wt 150.0 lb

## 2022-12-17 DIAGNOSIS — M1652 Unilateral post-traumatic osteoarthritis, left hip: Secondary | ICD-10-CM | POA: Diagnosis not present

## 2022-12-17 DIAGNOSIS — M5416 Radiculopathy, lumbar region: Secondary | ICD-10-CM | POA: Diagnosis not present

## 2022-12-17 MED ORDER — METHYLPREDNISOLONE ACETATE 40 MG/ML IJ SUSP
40.0000 mg | Freq: Once | INTRAMUSCULAR | Status: AC
  Administered 2022-12-17: 40 mg via INTRAMUSCULAR

## 2022-12-17 NOTE — Patient Instructions (Signed)
Good to see you Please continue heat  Please continue the exercises  We'll make a referral to the surgeon today   Please send me a message in [place] with any questions or updates.  Please see me back as needed.   --Dr. Raeford Razor

## 2022-12-17 NOTE — Assessment & Plan Note (Signed)
Acutely occurring.  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 -Counseled on home exercise therapy and supportive care. -IM Depo-Medrol. -Could consider epidural.

## 2022-12-17 NOTE — Assessment & Plan Note (Signed)
Acute on chronic in nature.  We have tried with shockwave therapy and different forms of injections with limited success.  She has been on Prolia for her osteoporosis which should help in terms of considering an arthroplasty for this hip.  She does continue to have groin pain with degenerative changes in the joint. -Counseled on home exercise therapy and supportive care. -Referral to orthopedic surgeon.

## 2022-12-17 NOTE — Progress Notes (Signed)
  Hayley Walters - 77 y.o. female MRN [id number]  Date of birth: 1946/05/10  SUBJECTIVE:  Including CC & ROS.  No chief complaint on file.   Hayley Walters is a 77 y.o. female that is  here for worsening left hip pain. This is acute on chronic in nature. Has tried several injections and conservative measures.   Review of Systems See HPI   HISTORY: Past Medical, Surgical, Social, and Family History Reviewed & Updated per EMR.   Pertinent Historical Findings include:  Past Medical History:  Diagnosis Date   Acute carpal tunnel syndrome of right wrist 07/21/2015   Anemia    Arthritis    Cancer (HCC)    Basal cell    Colon polyps    Depression    situational   Fracture of radius, distal, right, closed 07/21/2015   Headache    migraines   History of kidney stones    IBS (irritable bowel syndrome)    Non-alcoholic fatty liver disease    mild   Osteoporosis    Pneumonia    as a child   Pulmonary emboli ([place])     Past Surgical History:  Procedure Laterality Date   ABDOMINAL HYSTERECTOMY     BREAST SURGERY     augmentation   CARPAL TUNNEL RELEASE Right 07/21/2015   Procedure: RIGHT CARPAL TUNNEL RELEASE ;  Surgeon: Marchia Bond, MD;  Location: [place];  Service: Orthopedics;  Laterality: Right;   COLONOSCOPY     CYSTOSCOPY/URETEROSCOPY/HOLMIUM LASER/STENT PLACEMENT Left 12/05/2020   Procedure: CYSTOSCOPY/LEFT RETROGRADE PLYOGRAM/LEFT DIAGNOSTIC URETEROSCOPY AND STENT PLACEMENT;  Surgeon: Festus Aloe, MD;  Location[facility];  Service: Urology;  Laterality: Left;  ONLY NEEDS 60 MIN   CYSTOSCOPY/URETEROSCOPY/HOLMIUM LASER/STENT PLACEMENT Left 01/12/2021   Procedure: CYSTOSCOPY/RETROGRADE/URETEROSCOPY/HOLMIUM LASER/STENT PLACEMENT;  Surgeon: Festus Aloe, MD;  Location[facility];  Service: Urology;  Laterality: Left;  ONLY NEEDS 70 MIN   FRACTURE SURGERY Left    hip/femur   FRACTURE SURGERY Right    screws in right hip for stress fracture   OPEN REDUCTION  INTERNAL FIXATION (ORIF) DISTAL RADIAL FRACTURE Right 07/21/2015   Procedure: OPEN REDUCTION INTERNAL FIXATION (ORIF) RIGHT DISTAL RADIAL FRACTURE;  Surgeon: Marchia Bond, MD;  Location: [place];  Service: Orthopedics;  Laterality: Right;   TONSILLECTOMY       PHYSICAL EXAM:  VS: BP 138/86   Ht 5' (1.524 m)   Wt 150 lb (68 kg)   BMI 29.29 kg/m  Physical Exam Gen: NAD, alert, cooperative with exam, well-appearing MSK:  Neurovascularly intact       ASSESSMENT & PLAN:   Lumbar radiculopathy Acutely occurring.  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 -Counseled on home exercise therapy and supportive care. -IM Depo-Medrol. -Could consider epidural.  Post-traumatic osteoarthritis of left hip Acute on chronic in nature.  We have tried with shockwave therapy and different forms of injections with limited success.  She has been on Prolia for her osteoporosis which should help in terms of considering an arthroplasty for this hip.  She does continue to have groin pain with degenerative changes in the joint. -Counseled on home exercise therapy and supportive care. -Referral to orthopedic surgeon.

## 2022-12-20 ENCOUNTER — Other Ambulatory Visit: Payer: Self-pay | Admitting: Family Medicine

## 2022-12-23 ENCOUNTER — Other Ambulatory Visit: Payer: Self-pay | Admitting: Family Medicine

## 2022-12-23 MED ORDER — HYDROCODONE-ACETAMINOPHEN 5-325 MG PO TABS: 1.0000 | ORAL_TABLET | Freq: Three times a day (TID) | ORAL | 0 refills | Status: DC | PRN

## 2022-12-23 NOTE — Telephone Encounter (Signed)
Refilled norco.   Rosemarie Ax, MD [facility] 12/23/2022, 9:43 AM

## 2022-12-23 NOTE — Telephone Encounter (Signed)
Please reach out to pt regarding scheduling Prolia inj  Pt ready for scheduling on or after 12/08/22   Out-of-pocket cost due at time of visit: $315   Primary: [employer] Adv Prolia co-insurance: 20% (approximately $290) Admin fee co-insurance: 20% (approximately $25)   Secondary: n/a Prolia co-insurance:  Admin fee co-insurance:    Deductible: does not apply   Prior Auth: not required PA# Valid:    ** This summary of benefits is an estimation of the patient's out-of-pocket cost. Exact cost may vary based on individual plan coverage.

## 2022-12-24 DIAGNOSIS — J208 Acute bronchitis due to other specified organisms: Secondary | ICD-10-CM | POA: Diagnosis not present

## 2022-12-24 DIAGNOSIS — B9689 Other specified bacterial agents as the cause of diseases classified elsewhere: Secondary | ICD-10-CM | POA: Diagnosis not present

## 2022-12-24 NOTE — Telephone Encounter (Signed)
Pt informed of below.  Nurse visit scheduled 01/01/23 @ 10:00 am.

## 2022-12-26 ENCOUNTER — Encounter: Payer: Self-pay | Admitting: Family Medicine

## 2022-12-26 DIAGNOSIS — M1612 Unilateral primary osteoarthritis, left hip: Secondary | ICD-10-CM | POA: Diagnosis not present

## 2022-12-30 DIAGNOSIS — I2699 Other pulmonary embolism without acute cor pulmonale: Secondary | ICD-10-CM | POA: Diagnosis not present

## 2022-12-30 DIAGNOSIS — R0600 Dyspnea, unspecified: Secondary | ICD-10-CM | POA: Diagnosis not present

## 2022-12-30 DIAGNOSIS — Z86718 Personal history of other venous thrombosis and embolism: Secondary | ICD-10-CM | POA: Diagnosis not present

## 2022-12-30 DIAGNOSIS — I2694 Multiple subsegmental pulmonary emboli without acute cor pulmonale: Secondary | ICD-10-CM | POA: Diagnosis not present

## 2022-12-30 DIAGNOSIS — J208 Acute bronchitis due to other specified organisms: Secondary | ICD-10-CM | POA: Diagnosis not present

## 2022-12-31 ENCOUNTER — Telehealth: Payer: Self-pay | Admitting: Family Medicine

## 2022-12-31 ENCOUNTER — Encounter: Payer: Self-pay | Admitting: Family Medicine

## 2022-12-31 DIAGNOSIS — Z9889 Other specified postprocedural states: Secondary | ICD-10-CM | POA: Diagnosis not present

## 2022-12-31 DIAGNOSIS — I2699 Other pulmonary embolism without acute cor pulmonale: Secondary | ICD-10-CM

## 2022-12-31 DIAGNOSIS — M1611 Unilateral primary osteoarthritis, right hip: Secondary | ICD-10-CM | POA: Diagnosis not present

## 2022-12-31 NOTE — Telephone Encounter (Signed)
Spoke with patient about current diagnosis. She reports she had a recent CT that showed a pulmonary embolism. She reports being given eliquis. She reports this is a second pulmonary embolism. She reports no provocation as to the source of the clot. She doesn't have a reason as to her first embolism. Currently being treated for bronchitis. Will refer to hematology for two pulmonary embolism without direct course of her clotting.   Rosemarie Ax, MD [facility] 12/31/2022, 1:08 PM

## 2023-01-01 ENCOUNTER — Ambulatory Visit: Payer: PPO

## 2023-01-06 ENCOUNTER — Other Ambulatory Visit: Payer: Self-pay | Admitting: Family

## 2023-01-06 DIAGNOSIS — I2699 Other pulmonary embolism without acute cor pulmonale: Secondary | ICD-10-CM

## 2023-01-06 DIAGNOSIS — Z86718 Personal history of other venous thrombosis and embolism: Secondary | ICD-10-CM

## 2023-01-07 ENCOUNTER — Inpatient Hospital Stay (HOSPITAL_BASED_OUTPATIENT_CLINIC_OR_DEPARTMENT_OTHER): Payer: PPO | Admitting: Family

## 2023-01-07 ENCOUNTER — Other Ambulatory Visit: Payer: Self-pay

## 2023-01-07 ENCOUNTER — Inpatient Hospital Stay: Payer: PPO | Attending: Hematology & Oncology

## 2023-01-07 ENCOUNTER — Encounter: Payer: Self-pay | Admitting: Family

## 2023-01-07 VITALS — BP 135/53 | HR 81 | Temp 98.3°F | Resp 18 | Ht 60.0 in | Wt 152.4 lb

## 2023-01-07 DIAGNOSIS — Z87891 Personal history of nicotine dependence: Secondary | ICD-10-CM

## 2023-01-07 DIAGNOSIS — Z8719 Personal history of other diseases of the digestive system: Secondary | ICD-10-CM | POA: Diagnosis not present

## 2023-01-07 DIAGNOSIS — I2699 Other pulmonary embolism without acute cor pulmonale: Secondary | ICD-10-CM | POA: Diagnosis not present

## 2023-01-07 DIAGNOSIS — Z86718 Personal history of other venous thrombosis and embolism: Secondary | ICD-10-CM

## 2023-01-07 DIAGNOSIS — M810 Age-related osteoporosis without current pathological fracture: Secondary | ICD-10-CM

## 2023-01-07 DIAGNOSIS — Z7901 Long term (current) use of anticoagulants: Secondary | ICD-10-CM | POA: Diagnosis not present

## 2023-01-07 DIAGNOSIS — Z800 Family history of malignant neoplasm of digestive organs: Secondary | ICD-10-CM | POA: Insufficient documentation

## 2023-01-07 LAB — CBC WITH DIFFERENTIAL (CANCER CENTER ONLY)
Abs Immature Granulocytes: 0.05 10*3/uL (ref 0.00–0.07)
Basophils Absolute: 0 10*3/uL (ref 0.0–0.1)
Basophils Relative: 0 %
Eosinophils Absolute: 0 10*3/uL (ref 0.0–0.5)
Eosinophils Relative: 0 %
HCT: 38.7 % (ref 36.0–46.0)
Hemoglobin: 12.5 g/dL (ref 12.0–15.0)
Immature Granulocytes: 1 %
Lymphocytes Relative: 10 %
Lymphs Abs: 1.1 10*3/uL (ref 0.7–4.0)
MCH: 31.9 pg (ref 26.0–34.0)
MCHC: 32.3 g/dL (ref 30.0–36.0)
MCV: 98.7 fL (ref 80.0–100.0)
Monocytes Absolute: 0.8 10*3/uL (ref 0.1–1.0)
Monocytes Relative: 8 %
Neutro Abs: 8.5 10*3/uL — ABNORMAL HIGH (ref 1.7–7.7)
Neutrophils Relative %: 81 %
Platelet Count: 166 10*3/uL (ref 150–400)
RBC: 3.92 MIL/uL (ref 3.87–5.11)
RDW: 13 % (ref 11.5–15.5)
WBC Count: 10.5 10*3/uL (ref 4.0–10.5)
nRBC: 0 % (ref 0.0–0.2)

## 2023-01-07 LAB — CMP (CANCER CENTER ONLY)
ALT: 15 U/L (ref 0–44)
AST: 14 U/L — ABNORMAL LOW (ref 15–41)
Albumin: 4.2 g/dL (ref 3.5–5.0)
Alkaline Phosphatase: 51 U/L (ref 38–126)
Anion gap: 8 (ref 5–15)
BUN: 32 mg/dL — ABNORMAL HIGH (ref 8–23)
CO2: 30 mmol/L (ref 22–32)
Calcium: 10.5 mg/dL — ABNORMAL HIGH (ref 8.9–10.3)
Chloride: 103 mmol/L (ref 98–111)
Creatinine: 0.89 mg/dL (ref 0.44–1.00)
GFR, Estimated: >60 mL/min (ref >60)
Glucose, Bld: 102 mg/dL — ABNORMAL HIGH (ref 70–99)
Potassium: 5.1 mmol/L (ref 3.5–5.1)
Sodium: 141 mmol/L (ref 135–145)
Total Bilirubin: 0.7 mg/dL (ref 0.3–1.2)
Total Protein: 6.5 g/dL (ref 6.5–8.1)

## 2023-01-07 LAB — D-DIMER, QUANTITATIVE: D-Dimer, Quant: 0.34 ug/mL-FEU (ref 0.00–0.50)

## 2023-01-07 LAB — ANTITHROMBIN III: AntiThromb III Func: 126 % — ABNORMAL HIGH (ref 75–120)

## 2023-01-07 LAB — LACTATE DEHYDROGENASE: LDH: 189 U/L (ref 98–192)

## 2023-01-07 NOTE — Progress Notes (Signed)
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  She was initially diagnosed with right lower extremity DVT and bilateral PE back in 2022. At the time she had been under a lot of stress caring for her mother and sister as well as contracting Covid and developing a kidney stone. She was treated with 4-6 month of Eliquis.  She now has new diagnosis of recurrent PE. She had Covid the week after Christmas and then developed bronchitis. 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 She is currently on Eliquis 5 mg PO BID.  She still notes some SOB with exertion as well as occasional palpitations.  No issues so far with bleeding, abnormal bruising or petechiae.  No known family history of thrombotic event.  She has 2 biological children and one adopted. She had 1 c-section and no history of miscarriage.  She had a partial hysterectomy in her 6's and was on hormone replacement therapy for a while.  She has not been on hormone replacement therapy in many years.  She has history of thyroid nodules and has been followed by Dr. Harlow Asa in the past.  No history of diabetes.  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  Sister passed away from pancreatic cancer.  No fever, chills, n/v, cough, rash, dizziness, chest  pain, abdominal pain or changes in bowel or bladder habits.  She states that she has severe osteoporosis and has had stress fractures to both hips. She had a full break to the left hip and now has a rod and pine. She has pins in the right hip. She is followed closely by sports medicine.  No swelling, tenderness, numbness or tingling in her extremities.  No falls or syncope reported.  Appetite and hydration are good. Weight is stable at 152 lbs.  No smoking, ETOH or recreational drug use.  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   ROS: All other 10 point review of systems is negative.   PAST MEDICAL HISTORY:   Past Medical History:  Diagnosis Date   Acute carpal tunnel syndrome of right wrist 07/21/2015   Anemia    Arthritis    Cancer (HCC)    Basal cell    Colon polyps    Depression    situational   Fracture of radius, distal, right, closed 07/21/2015   Headache    migraines   History of kidney stones    IBS (irritable bowel syndrome)    Non-alcoholic fatty liver disease    mild   Osteoporosis    Pneumonia    as a child   Pulmonary emboli ([place])     ALLERGIES: No Known Allergies    MEDICATIONS:  Current  Outpatient Medications on File Prior to Visit  Medication Sig Dispense Refill   ALPRAZolam (XANAX) 0.5 MG tablet Take 0.5 mg by mouth 3 (three) times daily as needed for anxiety.     baclofen (LIORESAL) 10 MG tablet TAKE 1/2 BY MOUTH 3 TIMES DAILY AS NEEDED FOR MUSCLE SPASMS 60 each 1   HYDROcodone-acetaminophen (NORCO/VICODIN) 5-325 MG tablet Take 1 tablet by mouth every 8 (eight) hours as needed. 15 tablet 0   rizatriptan (MAXALT-MLT) 10 MG disintegrating tablet Take 10 mg by mouth See admin instructions. Take 1 tablet (10 mg) by mouth as needed for migraines, may repeat in 2 hours if needed  3   sertraline (ZOLOFT) 100 MG tablet Take 50 mg by mouth daily.  3    triamcinolone ointment (KENALOG) 0.1 % Apply 1 application topically 2 (two) times daily. To affected areas 60 g 6   Current Facility-Administered Medications on File Prior to Visit  Medication Dose Route Frequency Provider Last Rate Last Admin   triamcinolone acetonide (KENALOG) 10 MG/ML injection 10 mg  10 mg Other Once Landis Martins, DPM         PAST SURGICAL HISTORY Past Surgical History:  Procedure Laterality Date   ABDOMINAL HYSTERECTOMY     BREAST SURGERY     augmentation   CARPAL TUNNEL RELEASE Right 07/21/2015   Procedure: RIGHT CARPAL TUNNEL RELEASE ;  Surgeon: Marchia Bond, MD;  Location: [place];  Service: Orthopedics;  Laterality: Right;   COLONOSCOPY     CYSTOSCOPY/URETEROSCOPY/HOLMIUM LASER/STENT PLACEMENT Left 12/05/2020   Procedure: CYSTOSCOPY/LEFT RETROGRADE PLYOGRAM/LEFT DIAGNOSTIC URETEROSCOPY AND STENT PLACEMENT;  Surgeon: Festus Aloe, MD;  Location[facility];  Service: Urology;  Laterality: Left;  ONLY NEEDS 60 MIN   CYSTOSCOPY/URETEROSCOPY/HOLMIUM LASER/STENT PLACEMENT Left 01/12/2021   Procedure: CYSTOSCOPY/RETROGRADE/URETEROSCOPY/HOLMIUM LASER/STENT PLACEMENT;  Surgeon: Festus Aloe, MD;  Location: [facility];  Service: Urology;  Laterality: Left;  ONLY NEEDS 70 MIN   FRACTURE SURGERY Left    hip/femur   FRACTURE SURGERY Right    screws in right hip for stress fracture   OPEN REDUCTION INTERNAL FIXATION (ORIF) DISTAL RADIAL FRACTURE Right 07/21/2015   Procedure: OPEN REDUCTION INTERNAL FIXATION (ORIF) RIGHT DISTAL RADIAL FRACTURE;  Surgeon: Marchia Bond, MD;  Location: [place];  Service: Orthopedics;  Laterality: Right;   TONSILLECTOMY      FAMILY HISTORY: Family History  Problem Relation Age of Onset   Congestive Heart Failure Mother    Glaucoma Mother    Macular degeneration Mother    CVA Father     SOCIAL HISTORY:  reports that she quit smoking about 17 years ago. She has never used smokeless tobacco. She  reports that she does not currently use alcohol. She reports that she does not use drugs.  PERFORMANCE STATUS: The patient's performance status is 1 - Symptomatic but completely ambulatory  PHYSICAL EXAM: Most Recent Vital Signs: There were no vitals taken for this visit. There were no vitals taken for this visit.  General Appearance:    Alert, cooperative, no distress, appears stated age  Head:    Normocephalic, without obvious abnormality, atraumatic  Eyes:    PERRL, conjunctiva/corneas clear, EOM's intact, fundi    benign, both eyes        Throat:   Lips, mucosa, and tongue normal; teeth and gums normal  Neck:   Supple, symmetrical, trachea midline, no adenopathy;    thyroid:  no enlargement/tenderness/nodules; no carotid   bruit or JVD  Back:  Symmetric, no curvature, ROM normal, no CVA tenderness  Lungs:     Clear to auscultation bilaterally, respirations unlabored  Chest Wall:    No tenderness or deformity   Heart:    Regular rate and rhythm, S1 and S2 normal, no murmur, rub   or gallop     Abdomen:     Soft, non-tender, bowel sounds active all four quadrants,    no masses, no organomegaly        Extremities:   Extremities normal, atraumatic, no cyanosis or edema  Pulses:   2+ and symmetric all extremities  Skin:   Skin color, texture, turgor normal, no rashes or lesions  Lymph nodes:   Cervical, supraclavicular, and axillary nodes normal  Neurologic:   CNII-XII intact, normal strength, sensation and reflexes    throughout    LABORATORY DATA:  Results for orders placed or performed in visit on 01/07/23 (from the past 48 hour(s))  CBC with Differential (Cancer Center Only)     Status: Abnormal   Collection Time: 01/07/23 10:49 AM  Result Value Ref Range   WBC Count 10.5 4.0 - 10.5 K/uL   RBC 3.92 3.87 - 5.11 MIL/uL   Hemoglobin 12.5 12.0 - 15.0 g/dL   HCT 38.7 36.0 - 46.0 %   MCV 98.7 80.0 - 100.0 fL   MCH 31.9 26.0 - 34.0 pg   MCHC 32.3 30.0 - 36.0 g/dL   RDW  13.0 11.5 - 15.5 %   Platelet Count 166 150 - 400 K/uL   nRBC 0.0 0.0 - 0.2 %   Neutrophils Relative % 81 %   Neutro Abs 8.5 (H) 1.7 - 7.7 K/uL   Lymphocytes Relative 10 %   Lymphs Abs 1.1 0.7 - 4.0 K/uL   Monocytes Relative 8 %   Monocytes Absolute 0.8 0.1 - 1.0 K/uL   Eosinophils Relative 0 %   Eosinophils Absolute 0.0 0.0 - 0.5 K/uL   Basophils Relative 0 %   Basophils Absolute 0.0 0.0 - 0.1 K/uL   Immature Granulocytes 1 %   Abs Immature Granulocytes 0.05 0.00 - 0.07 K/uL    Comment: Performed at [facility] at [facility], [address] [place], [place] [address]      RADIOGRAPHY: No results found.     PATHOLOGY: None  ASSESSMENT/PLAN: Ms. Selk is a very pleasant 77 yo caucasian female with history of right lower extremity DVT and bilateral PE's in 2022 with Covid. 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  She is doing well on Eliquis and will continue her same regimen with 5 mg PO BID.  Hyper coag panel pending.  Follow-up with repeat CT angio in 10 weeks.   All questions were answered. The patient knows to call the clinic with any problems, questions or concerns. We can certainly see the patient much sooner if necessary.  The patient was discussed with and also seen by Dr. Marin Olp and he is in agreement with the aforementioned.   Lottie Dawson, NP

## 2023-01-08 LAB — LUPUS ANTICOAGULANT PANEL
DRVVT: 94.9 s — ABNORMAL HIGH (ref 0.0–47.0)
PTT Lupus Anticoagulant: 27.1 s (ref 0.0–43.5)

## 2023-01-08 LAB — DRVVT MIX: dRVVT Mix: 67.3 s — ABNORMAL HIGH (ref 0.0–40.4)

## 2023-01-08 LAB — HOMOCYSTEINE: Homocysteine: 6.5 umol/L (ref 0.0–19.2)

## 2023-01-08 LAB — PROTEIN S ACTIVITY: Protein S Activity: 136 % (ref 63–140)

## 2023-01-08 LAB — BETA-2-GLYCOPROTEIN I ABS, IGG/M/A
Beta-2 Glyco I IgG: <9 GPI IgG units (ref 0–20)
Beta-2-Glycoprotein I IgA: <9 GPI IgA units (ref 0–25)
Beta-2-Glycoprotein I IgM: <9 GPI IgM units (ref 0–32)

## 2023-01-08 LAB — PROTEIN S, TOTAL: Protein S Ag, Total: 125 % (ref 60–150)

## 2023-01-08 LAB — PROTEIN C ACTIVITY: Protein C Activity: 171 % (ref 73–180)

## 2023-01-08 LAB — DRVVT CONFIRM: dRVVT Confirm: 1.2 ratio (ref 0.8–1.2)

## 2023-01-08 IMAGING — CT CT ANGIO CHEST
2 of 8 series · 18 of 36 positions shown · IV contrast (Omnipaque)
Comparison: None.

CLINICAL DATA: Shortness of breath

EXAM:
CT ANGIOGRAPHY CHEST WITH CONTRAST
TECHNIQUE: Multidetector CT imaging of the chest was performed using the
standard protocol during bolus administration of intravenous
contrast. Multiplanar CT image reconstructions and MIPs were
obtained to evaluate the vascular anatomy.
CONTRAST:  100mL OMNIPAQUE IOHEXOL 350 MG/ML SOLN

[Series 6: pe thins · axial · 0.65mm/px · z∈[-285,-47]mm · 17 of 267 slices shown]
[im 15/267  lung]
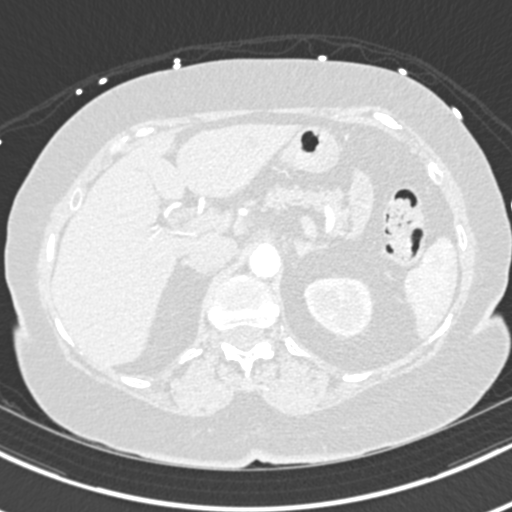
[im 29/267  mediastinal]
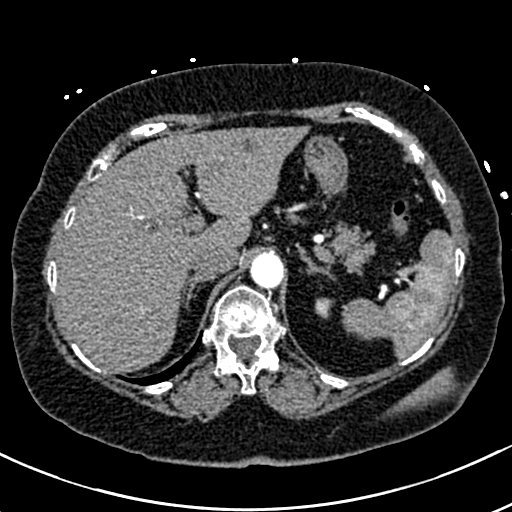
[im 43/267  lung]
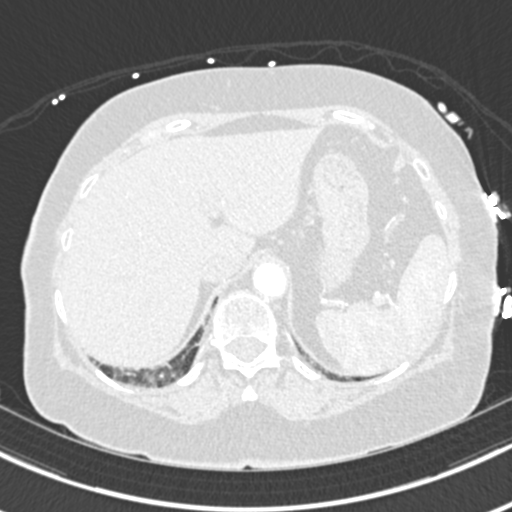
[im 57/267  mediastinal]
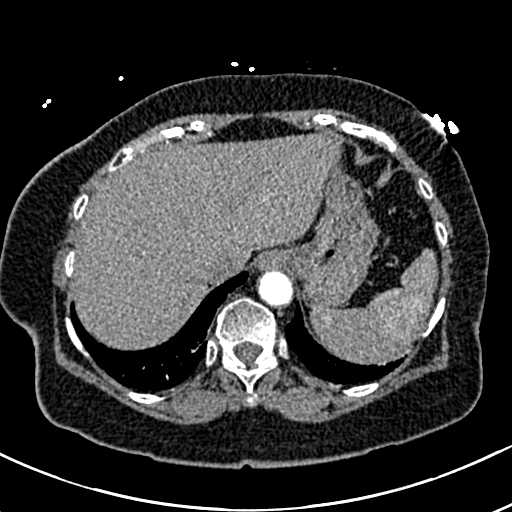
[im 71/267  lung]
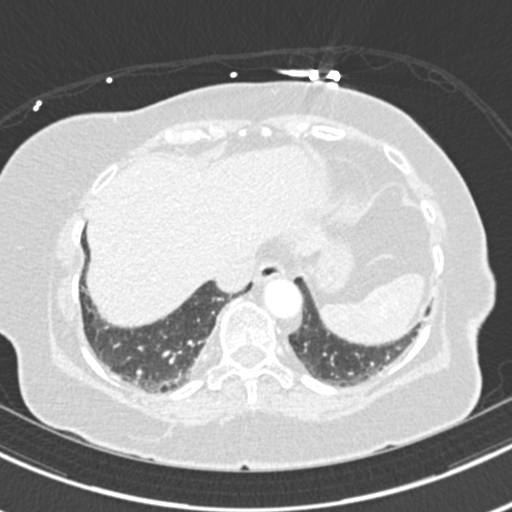
[im 85/267  mediastinal]
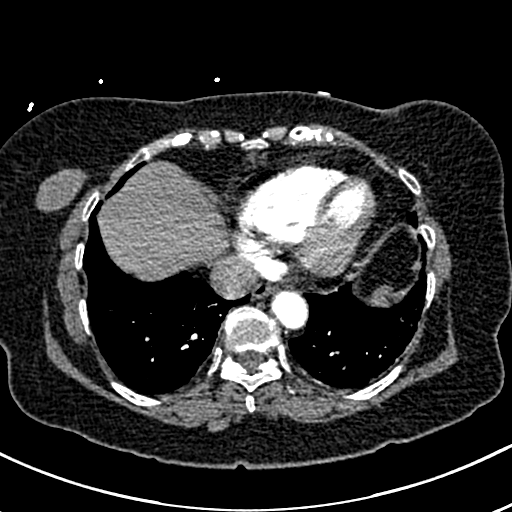
[im 99/267  lung]
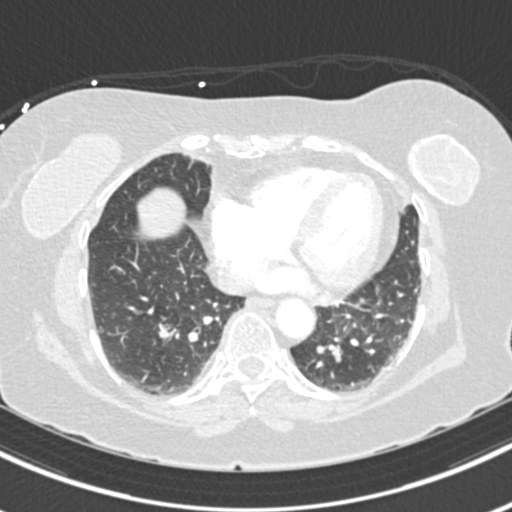
[im 113/267  mediastinal]
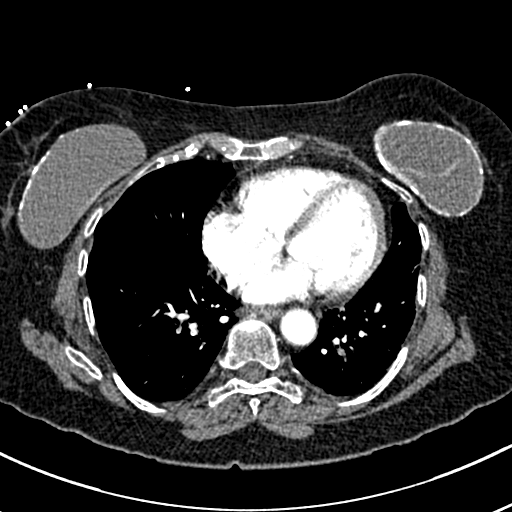
[im 141/267  lung]
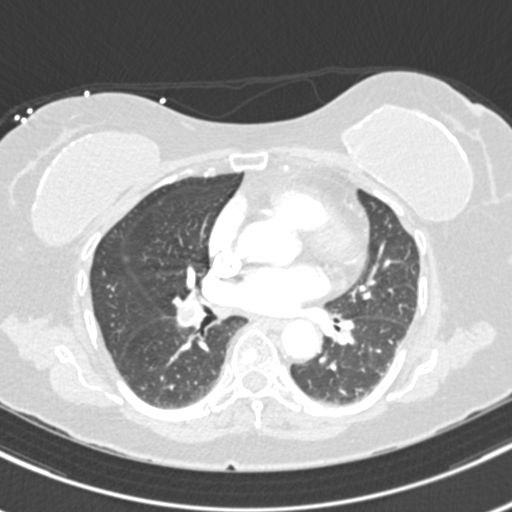
[im 155/267  mediastinal]
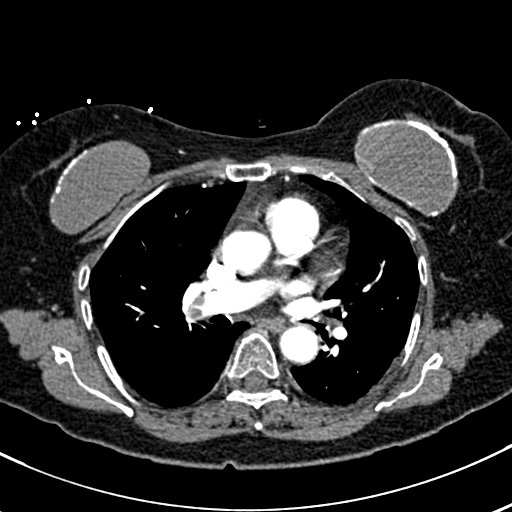
[im 169/267  lung]
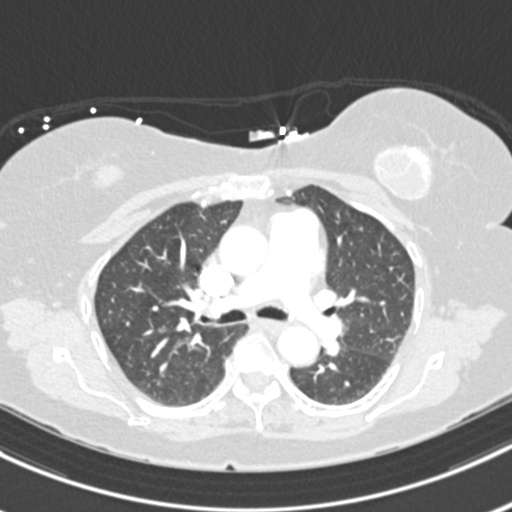
[im 183/267  mediastinal]
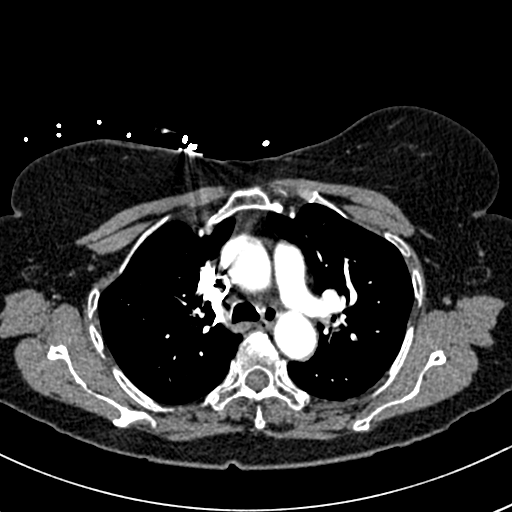
[im 197/267  lung]
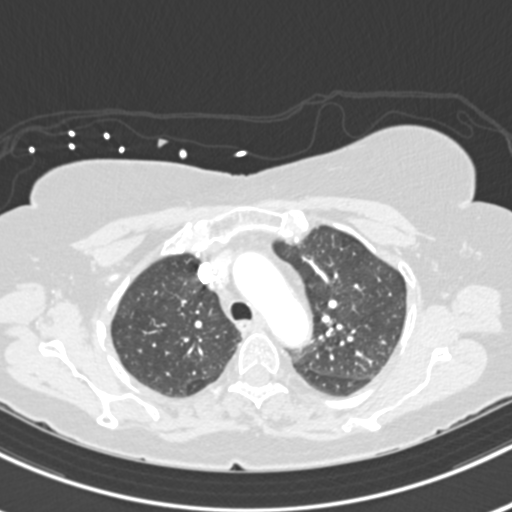
[im 211/267  mediastinal]
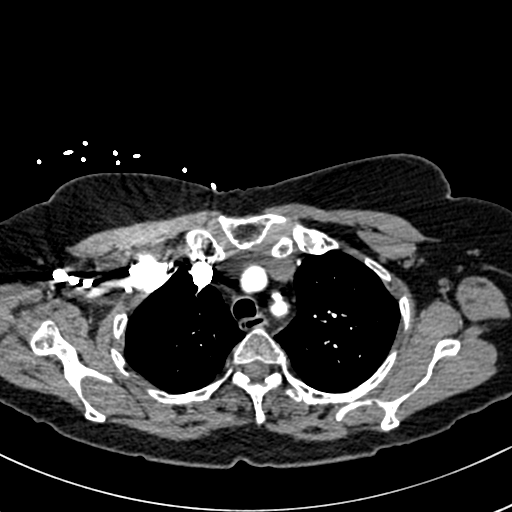
[im 225/267  lung]
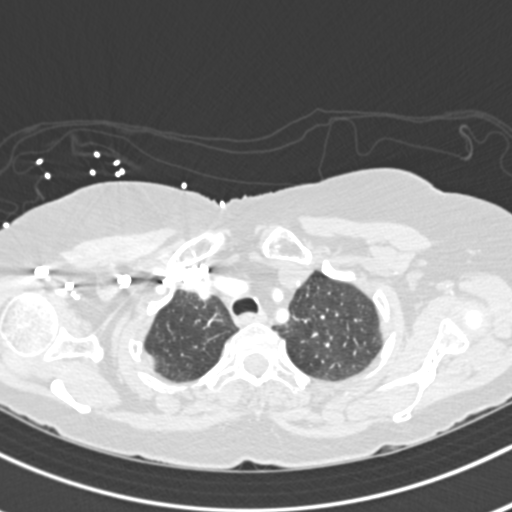
[im 239/267  mediastinal]
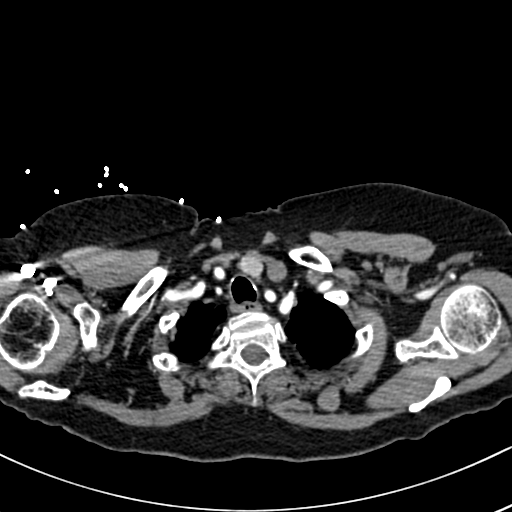
[im 253/267  lung]
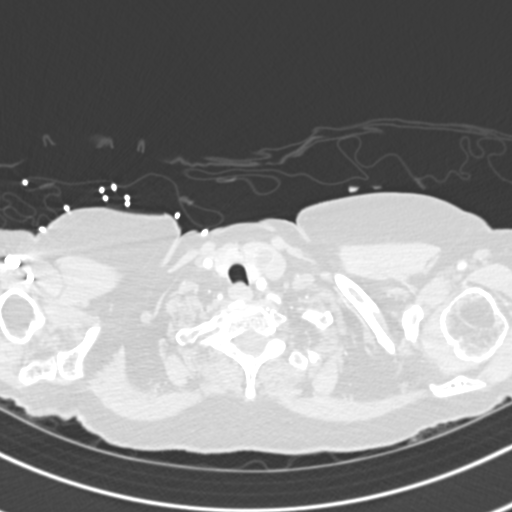

[Series 7: pe coronal mpr · coronal · 0.57mm/px · 1 of 113 slices shown]
[im 57/113  mediastinal]
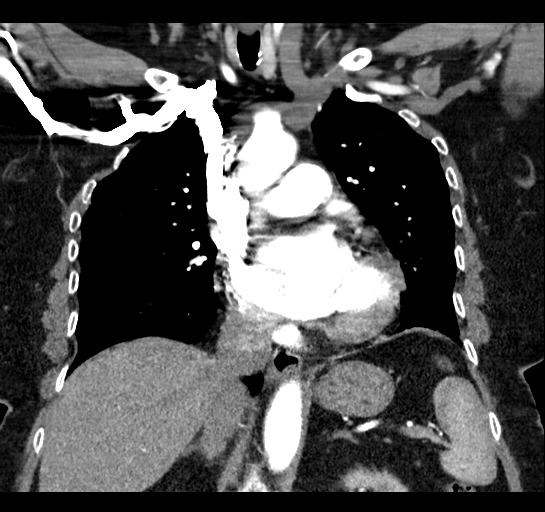

[18 of 36 positions shown; findings below may reference images not displayed]

FINDINGS: Cardiovascular: There is pulmonary embolus arising from the distal
right main pulmonary artery with extension of pulmonary emboli into
multiple right lower lobe branches. There are pulmonary emboli seen
in the posterior segment left lower lobe branch. No more proximal
pulmonary embolus seen on the left. There is no evident right heart
strain. There is no appreciable thoracic aortic aneurysm or
dissection. Visualized great vessels appear unremarkable. Note that
the right innominate and left common carotid arteries arise as a
common trunk, an anatomic variant. There is no pericardial effusion
or pericardial thickening.

Mediastinum/Nodes: There is a mass arising from the left lobe of the
thyroid measuring 1.7 x 1.2 cm.

There is no appreciable thoracic adenopathy. No esophageal lesions
are evident.

Lungs/Pleura: There is mild bibasilar atelectasis. Lungs otherwise
are clear. No pulmonary infarct evident. No pleural effusions are
evident.

Upper Abdomen: There is a cyst in the left lobe of the liver
measuring 1.5 x 1.5 cm. There is an apparent double-J stent in the
upper left kidney, incompletely visualized.

Musculoskeletal: There is endplate concavity at T7 and T12. No
blastic or lytic bone lesions are appreciable. There are breast
implants bilaterally with peripheral calcification. No chest wall
lesions.

Review of the MIP images confirms the above findings.
IMPRESSION: 1. Pulmonary emboli bilaterally, significantly more on the right
than on the left. Pulmonary emboli on the right arise from the
distal main pulmonary artery with extension into multiple right
lower lobe branches. Pulmonary embolus on the left is in the
posterior segment left lower lobe pulmonary artery. No right heart
strain.

2.  No thoracic aortic aneurysm or dissection.

3. There is a 1.7 x 1.2 cm mass in the left lobe of the thyroid.
Recommend thyroid US per consensus guidelines. (Ref: [HOSPITAL]. [DATE]): 143-50).

4. Mild bibasilar atelectasis. No edema or airspace opacity. No
pleural effusions.

5.  No evident adenopathy.

Critical Value/emergent results were called by telephone at the time
of interpretation on 12/13/2020 at [DATE] to provider BLAIN JUMPER
, who verbally acknowledged these results.

## 2023-01-09 LAB — PROTEIN C, TOTAL: Protein C, Total: 135 % (ref 60–150)

## 2023-01-09 LAB — CARDIOLIPIN ANTIBODIES, IGG, IGM, IGA
Anticardiolipin IgA: <9 APL U/mL (ref 0–11)
Anticardiolipin IgG: <9 GPL U/mL (ref 0–14)
Anticardiolipin IgM: <9 MPL U/mL (ref 0–12)

## 2023-01-13 ENCOUNTER — Encounter: Payer: Self-pay | Admitting: Family

## 2023-01-15 LAB — PROTHROMBIN GENE MUTATION

## 2023-01-16 LAB — FACTOR 5 LEIDEN

## 2023-01-20 ENCOUNTER — Telehealth: Payer: Self-pay | Admitting: Family

## 2023-01-20 NOTE — Telephone Encounter (Signed)
Hyper coag panel reviewed with Dr. Marin Olp. Results were negative. I then was able to speak with the patient and let her know as well. No questions or concerns at this time. Patient appreciative of call.

## 2023-01-29 ENCOUNTER — Ambulatory Visit: Payer: PPO | Admitting: *Deleted

## 2023-01-29 ENCOUNTER — Encounter: Payer: Self-pay | Admitting: Family Medicine

## 2023-01-29 ENCOUNTER — Ambulatory Visit: Payer: PPO | Admitting: Family Medicine

## 2023-01-29 ENCOUNTER — Encounter: Payer: Self-pay | Admitting: *Deleted

## 2023-01-29 VITALS — BP 122/80 | Ht 60.0 in | Wt 150.0 lb

## 2023-01-29 DIAGNOSIS — R0789 Other chest pain: Secondary | ICD-10-CM | POA: Diagnosis not present

## 2023-01-29 DIAGNOSIS — M25551 Pain in right hip: Secondary | ICD-10-CM | POA: Diagnosis not present

## 2023-01-29 DIAGNOSIS — M4316 Spondylolisthesis, lumbar region: Secondary | ICD-10-CM

## 2023-01-29 DIAGNOSIS — M818 Other osteoporosis without current pathological fracture: Secondary | ICD-10-CM | POA: Diagnosis not present

## 2023-01-29 MED ORDER — HYDROCODONE-ACETAMINOPHEN 5-325 MG PO TABS: 1.0000 | ORAL_TABLET | Freq: Three times a day (TID) | ORAL | 0 refills | Status: DC | PRN

## 2023-01-29 MED ORDER — DENOSUMAB 60 MG/ML ~~LOC~~ SOSY
60.0000 mg | PREFILLED_SYRINGE | Freq: Once | SUBCUTANEOUS | Status: AC
  Administered 2023-01-29: 60 mg via SUBCUTANEOUS

## 2023-01-29 NOTE — Progress Notes (Signed)
Patient received Prolia 60 mg SQ injection in her right arm. She tolerated injection well. She will return in 6 months for her next injection.

## 2023-01-29 NOTE — Assessment & Plan Note (Signed)
Acute on chronic in nature.  Likely contributing to the pain that she experiences in the bilateral hips. -Counseled on home exercise therapy and supportive care. -Norco.

## 2023-01-29 NOTE — Progress Notes (Signed)
NADELYN CHRISP - 77 y.o. female MRN [id number]  Date of birth: March 22, 1946  SUBJECTIVE:  Including CC & ROS.  No chief complaint on file.   Marcheta ASHELEY TONGATE is a 77 y.o. female that is presenting with acute on chronic right hip pain and acute chest pain.  She is on Eliquis for a resolving pulmonary embolism.  She gets intermittent substernal chest pain that resolves intermittently on its own.  It is not associated with any activity and comes without trigger..    Review of Systems See HPI   HISTORY: Past Medical, Surgical, Social, and Family History Reviewed & Updated per EMR.   Pertinent Historical Findings include:  Past Medical History:  Diagnosis Date   Acute carpal tunnel syndrome of right wrist 07/21/2015   Anemia    Arthritis    Cancer (HCC)    Basal cell    Colon polyps    Depression    situational   Fracture of radius, distal, right, closed 07/21/2015   Headache    migraines   History of kidney stones    IBS (irritable bowel syndrome)    Non-alcoholic fatty liver disease    mild   Osteoporosis    Pneumonia    as a child   Pulmonary emboli ([place])     Past Surgical History:  Procedure Laterality Date   ABDOMINAL HYSTERECTOMY     BREAST SURGERY     augmentation   CARPAL TUNNEL RELEASE Right 07/21/2015   Procedure: RIGHT CARPAL TUNNEL RELEASE ;  Surgeon: Marchia Bond, MD;  Location: [place] [facility];  Service: Orthopedics;  Laterality: Right;   COLONOSCOPY     CYSTOSCOPY/URETEROSCOPY/HOLMIUM LASER/STENT PLACEMENT Left 12/05/2020   Procedure: CYSTOSCOPY/LEFT RETROGRADE PLYOGRAM/LEFT DIAGNOSTIC URETEROSCOPY AND STENT PLACEMENT;  Surgeon: Festus Aloe, MD;  Location: [facility];  Service: Urology;  Laterality: Left;  ONLY NEEDS 60 MIN   CYSTOSCOPY/URETEROSCOPY/HOLMIUM LASER/STENT PLACEMENT Left 01/12/2021   Procedure: CYSTOSCOPY/RETROGRADE/URETEROSCOPY/HOLMIUM LASER/STENT PLACEMENT;  Surgeon: Festus Aloe, MD;  Location[facility];  Service: Urology;  Laterality:  Left;  ONLY NEEDS 70 MIN   FRACTURE SURGERY Left    hip/femur   FRACTURE SURGERY Right    screws in right hip for stress fracture   OPEN REDUCTION INTERNAL FIXATION (ORIF) DISTAL RADIAL FRACTURE Right 07/21/2015   Procedure: OPEN REDUCTION INTERNAL FIXATION (ORIF) RIGHT DISTAL RADIAL FRACTURE;  Surgeon: Marchia Bond, MD;  Location: [place];  Service: Orthopedics;  Laterality: Right;   TONSILLECTOMY       PHYSICAL EXAM:  VS: BP 122/80   Ht 5' (1.524 m)   Wt 150 lb (68 kg)   BMI 29.29 kg/m  Physical Exam Gen: NAD, alert, cooperative with exam, well-appearing MSK:  Neurovascularly intact     EKG interpretation: Shows no ST elevation.  ASSESSMENT & PLAN:   Greater trochanteric pain syndrome of right lower extremity Acute on chronic in nature.  Continues to have pain.  -Counseled on home exercise therapy and supportive care. -Counseled on compression. -Could consider physical therapy  Spondylolisthesis of lumbar region Acute on chronic in nature.  Likely contributing to the pain that she experiences in the bilateral hips. -Counseled on home exercise therapy and supportive care. -Norco.  Other chest pain Acutely occurring.  She is experiencing this chest pain intermittently with no specific trigger.  No diaphoresis but does have a burning substernal chest pain.  She has a pulmonary embolism that she is currently taking Eliquis for.  No previous history of any cardiovascular disease. -Counseled  on supportive care. -Performed EKG. -Troponin.

## 2023-01-29 NOTE — Assessment & Plan Note (Signed)
Acutely occurring.  She is experiencing this chest pain intermittently with no specific trigger.  No diaphoresis but does have a burning substernal chest pain.  She has a pulmonary embolism that she is currently taking Eliquis for.  No previous history of any cardiovascular disease. -Counseled on supportive care. -Performed EKG. -Troponin.

## 2023-01-29 NOTE — Assessment & Plan Note (Signed)
Acute on chronic in nature.  Continues to have pain.  -Counseled on home exercise therapy and supportive care. -Counseled on compression. -Could consider physical therapy

## 2023-01-29 NOTE — Patient Instructions (Signed)
Good to see you I will call with the lab results  You can consider the compression   Please send me a message in MyChart with any questions or updates.  Please see me back in 2-3 months.   --Dr. Raeford Razor

## 2023-01-30 LAB — TROPONIN T: Troponin T (Highly Sensitive): 11 ng/L (ref 0–14)

## 2023-02-03 NOTE — Telephone Encounter (Signed)
Last Prolia inj 01/29/23 Next Prolia inj due 07/31/23

## 2023-02-04 ENCOUNTER — Other Ambulatory Visit: Payer: Self-pay | Admitting: Family Medicine

## 2023-02-04 ENCOUNTER — Encounter: Payer: Self-pay | Admitting: Family Medicine

## 2023-02-04 MED ORDER — HYDROCODONE-ACETAMINOPHEN 5-325 MG PO TABS: 1.0000 | ORAL_TABLET | Freq: Three times a day (TID) | ORAL | 0 refills | Status: DC | PRN

## 2023-02-04 NOTE — Telephone Encounter (Signed)
Provided norco refill.   Rosemarie Ax, MD [facility] 02/04/2023, 3:21 PM

## 2023-02-26 ENCOUNTER — Encounter: Payer: Self-pay | Admitting: Family Medicine

## 2023-02-27 ENCOUNTER — Other Ambulatory Visit: Payer: Self-pay | Admitting: Family Medicine

## 2023-02-27 MED ORDER — HYDROCODONE-ACETAMINOPHEN 7.5-325 MG PO TABS: 1.0000 | ORAL_TABLET | Freq: Three times a day (TID) | ORAL | 0 refills | Status: DC | PRN

## 2023-03-05 ENCOUNTER — Other Ambulatory Visit: Payer: Self-pay

## 2023-03-05 ENCOUNTER — Encounter: Payer: Self-pay | Admitting: Family Medicine

## 2023-03-05 ENCOUNTER — Ambulatory Visit (INDEPENDENT_AMBULATORY_CARE_PROVIDER_SITE_OTHER): Payer: PPO | Admitting: Family Medicine

## 2023-03-05 VITALS — BP 138/60 | Ht 60.0 in | Wt 155.0 lb

## 2023-03-05 DIAGNOSIS — M25552 Pain in left hip: Secondary | ICD-10-CM | POA: Diagnosis not present

## 2023-03-05 DIAGNOSIS — M25551 Pain in right hip: Secondary | ICD-10-CM

## 2023-03-05 MED ORDER — METHYLPREDNISOLONE ACETATE 40 MG/ML IJ SUSP
40.0000 mg | Freq: Once | INTRAMUSCULAR | Status: AC
  Administered 2023-03-05: 40 mg via INTRAMUSCULAR

## 2023-03-05 NOTE — Assessment & Plan Note (Signed)
Acute on chronic in nature.  She has follow-up next month [facility].  He will be discussing about possible arthroplasty of the left hip.  She is having the left lateral hip pain but also having groin pain now. -Counseled on home exercise therapy and supportive care. -Injection today. -Could consider physical therapy

## 2023-03-05 NOTE — Assessment & Plan Note (Signed)
Acute on chronic in nature.  Having pain that is similar to her previous type of pain. -Counseled on home exercise therapy and supportive care. -Injection today. -Could consider physical therapy

## 2023-03-05 NOTE — Progress Notes (Signed)
Hayley Walters - 77 y.o. female MRN [id number]  Date of birth: Jul 31, 1946  SUBJECTIVE:  Including CC & ROS.  No chief complaint on file.   Hayley Walters is a 77 y.o. female that is presenting with acute on chronic bilateral hip pain.  The pain is severe in nature.  She is having more groin pain on the left than she normally does.  She has tried topical cream as well as braces and compression..   Review of Systems See HPI   HISTORY: Past Medical, Surgical, Social, and Family History Reviewed & Updated per EMR.   Pertinent Historical Findings include:  Past Medical History:  Diagnosis Date   Acute carpal tunnel syndrome of right wrist 07/21/2015   Anemia    Arthritis    Cancer    Basal cell    Colon polyps    Depression    situational   Fracture of radius, distal, right, closed 07/21/2015   Headache    migraines   History of kidney stones    IBS (irritable bowel syndrome)    Non-alcoholic fatty liver disease    mild   Osteoporosis    Pneumonia    as a child   Pulmonary emboli     Past Surgical History:  Procedure Laterality Date   ABDOMINAL HYSTERECTOMY     BREAST SURGERY     augmentation   CARPAL TUNNEL RELEASE Right 07/21/2015   Procedure: RIGHT CARPAL TUNNEL RELEASE ;  Surgeon: Marchia Bond, MD;  Location: [facility];  Service: Orthopedics;  Laterality: Right;   COLONOSCOPY     CYSTOSCOPY/URETEROSCOPY/HOLMIUM LASER/STENT PLACEMENT Left 12/05/2020   Procedure: CYSTOSCOPY/LEFT RETROGRADE PLYOGRAM/LEFT DIAGNOSTIC URETEROSCOPY AND STENT PLACEMENT;  Surgeon: Festus Aloe, MD;  [facility];  Service: Urology;  Laterality: Left;  ONLY NEEDS 60 MIN   CYSTOSCOPY/URETEROSCOPY/HOLMIUM LASER/STENT PLACEMENT Left 01/12/2021   Procedure: CYSTOSCOPY/RETROGRADE/URETEROSCOPY/HOLMIUM LASER/STENT PLACEMENT;  Surgeon: Festus Aloe, MD;  [facility];  Service: Urology;  Laterality: Left;  ONLY NEEDS 70 MIN   FRACTURE SURGERY Left    hip/femur   FRACTURE  SURGERY Right    screws in right hip for stress fracture   OPEN REDUCTION INTERNAL FIXATION (ORIF) DISTAL RADIAL FRACTURE Right 07/21/2015   Procedure: OPEN REDUCTION INTERNAL FIXATION (ORIF) RIGHT DISTAL RADIAL FRACTURE;  Surgeon: Marchia Bond, MD;  Location: [place];  Service: Orthopedics;  Laterality: Right;   TONSILLECTOMY       PHYSICAL EXAM:  VS: BP 138/60 (BP Location: Left Arm, Patient Position: Sitting)   Ht 5' (1.524 m)   Wt 155 lb (70.3 kg)   BMI 30.27 kg/m  Physical Exam Gen: NAD, alert, cooperative with exam, well-appearing MSK:  Neurovascularly intact     Aspiration/Injection Procedure Note Hayley Walters 1946-04-01  Procedure: Injection Indications: Left hip pain  Procedure Details Consent: Risks of procedure as well as the alternatives and risks of each were explained to the (patient/caregiver).  Consent for procedure obtained. Time Out: Verified patient identification, verified procedure, site/side was marked, verified correct patient position, special equipment/implants available, medications/allergies/relevent history reviewed, required imaging and test results available.  Performed.  The area was cleaned with iodine and alcohol swabs.    The left trochanteric bursa was injected using 1 cc of 40 mg Depo-Medrol with and 4 cc of 0.25% bupivacaine on a 22-gauge 3-1/2 inch needle. Ultrasound was used. Images were obtained in long views showing the injection.     A sterile dressing was applied.  Patient did  tolerate procedure well.   Aspiration/Injection Procedure Note Hayley Walters 1946/09/26  Procedure: Injection Indications: Right hip pain  Procedure Details Consent: Risks of procedure as well as the alternatives and risks of each were explained to the (patient/caregiver).  Consent for procedure obtained. Time Out: Verified patient identification, verified procedure, site/side was marked, verified correct patient position, special  equipment/implants available, medications/allergies/relevent history reviewed, required imaging and test results available.  Performed.  The area was cleaned with iodine and alcohol swabs.    The right trochanteric bursa was injected using 1 cc of 40 mg Depo-Medrol with and 4 cc of 0.25% bupivacaine on a 22-gauge 3-1/2 inch needle. Ultrasound was used. Images were obtained in long views showing the injection.   A sterile dressing was applied.  Patient did tolerate procedure well.     ASSESSMENT & PLAN:   Greater trochanteric pain syndrome of right lower extremity Acute on chronic in nature.  Having pain that is similar to her previous type of pain. -Counseled on home exercise therapy and supportive care. -Injection today. -Could consider physical therapy  Greater trochanteric pain syndrome of left lower extremity Acute on chronic in nature.  She has follow-up next month [facility].  He will be discussing about possible arthroplasty of the left hip.  She is having the left lateral hip pain but also having groin pain now. -Counseled on home exercise therapy and supportive care. -Injection today. -Could consider physical therapy

## 2023-03-05 NOTE — Patient Instructions (Signed)
Good to see you Please use ice as needed   Please send me a message in MyChart with any questions or updates.  Please see me back as needed.   --Dr. Alethea Terhaar  

## 2023-03-17 ENCOUNTER — Other Ambulatory Visit: Payer: Self-pay | Admitting: Family

## 2023-03-17 ENCOUNTER — Encounter: Payer: Self-pay | Admitting: *Deleted

## 2023-03-17 ENCOUNTER — Encounter: Payer: Self-pay | Admitting: Family Medicine

## 2023-03-17 DIAGNOSIS — Z86718 Personal history of other venous thrombosis and embolism: Secondary | ICD-10-CM

## 2023-03-17 DIAGNOSIS — I2699 Other pulmonary embolism without acute cor pulmonale: Secondary | ICD-10-CM

## 2023-03-18 ENCOUNTER — Inpatient Hospital Stay (HOSPITAL_BASED_OUTPATIENT_CLINIC_OR_DEPARTMENT_OTHER): Payer: PPO | Admitting: Family

## 2023-03-18 ENCOUNTER — Ambulatory Visit (HOSPITAL_BASED_OUTPATIENT_CLINIC_OR_DEPARTMENT_OTHER)
Admission: RE | Admit: 2023-03-18 | Discharge: 2023-03-18 | Disposition: A | Discharge status: Home / Self Care | Payer: PPO | Source: Ambulatory Visit | Attending: Family | Admitting: Family

## 2023-03-18 ENCOUNTER — Encounter (HOSPITAL_BASED_OUTPATIENT_CLINIC_OR_DEPARTMENT_OTHER): Payer: Self-pay

## 2023-03-18 ENCOUNTER — Inpatient Hospital Stay: Payer: PPO | Attending: Hematology & Oncology

## 2023-03-18 ENCOUNTER — Encounter: Payer: Self-pay | Admitting: Family

## 2023-03-18 VITALS — BP 157/74 | HR 69 | Temp 98.1°F | Resp 17 | Wt 157.4 lb

## 2023-03-18 DIAGNOSIS — Z8616 Personal history of COVID-19: Secondary | ICD-10-CM | POA: Diagnosis not present

## 2023-03-18 DIAGNOSIS — Z7901 Long term (current) use of anticoagulants: Secondary | ICD-10-CM | POA: Insufficient documentation

## 2023-03-18 DIAGNOSIS — I2699 Other pulmonary embolism without acute cor pulmonale: Secondary | ICD-10-CM | POA: Insufficient documentation

## 2023-03-18 DIAGNOSIS — Z86718 Personal history of other venous thrombosis and embolism: Secondary | ICD-10-CM

## 2023-03-18 DIAGNOSIS — R911 Solitary pulmonary nodule: Secondary | ICD-10-CM

## 2023-03-18 DIAGNOSIS — I2782 Chronic pulmonary embolism: Secondary | ICD-10-CM | POA: Insufficient documentation

## 2023-03-18 LAB — CBC WITH DIFFERENTIAL (CANCER CENTER ONLY)
Abs Immature Granulocytes: 0.03 10*3/uL (ref 0.00–0.07)
Basophils Absolute: 0 10*3/uL (ref 0.0–0.1)
Basophils Relative: 1 %
Eosinophils Absolute: 0 10*3/uL (ref 0.0–0.5)
Eosinophils Relative: 0 %
HCT: 40.8 % (ref 36.0–46.0)
Hemoglobin: 13.2 g/dL (ref 12.0–15.0)
Immature Granulocytes: 1 %
Lymphocytes Relative: 27 %
Lymphs Abs: 1.8 10*3/uL (ref 0.7–4.0)
MCH: 32.1 pg (ref 26.0–34.0)
MCHC: 32.4 g/dL (ref 30.0–36.0)
MCV: 99.3 fL (ref 80.0–100.0)
Monocytes Absolute: 0.8 10*3/uL (ref 0.1–1.0)
Monocytes Relative: 12 %
Neutro Abs: 4 10*3/uL (ref 1.7–7.7)
Neutrophils Relative %: 59 %
Platelet Count: 165 10*3/uL (ref 150–400)
RBC: 4.11 MIL/uL (ref 3.87–5.11)
RDW: 13.5 % (ref 11.5–15.5)
WBC Count: 6.7 10*3/uL (ref 4.0–10.5)
nRBC: 0 % (ref 0.0–0.2)

## 2023-03-18 LAB — CMP (CANCER CENTER ONLY)
ALT: 15 U/L (ref 0–44)
AST: 15 U/L (ref 15–41)
Albumin: 4 g/dL (ref 3.5–5.0)
Alkaline Phosphatase: 55 U/L (ref 38–126)
Anion gap: 7 (ref 5–15)
BUN: 19 mg/dL (ref 8–23)
CO2: 34 mmol/L — ABNORMAL HIGH (ref 22–32)
Calcium: 9.5 mg/dL (ref 8.9–10.3)
Chloride: 105 mmol/L (ref 98–111)
Creatinine: 0.9 mg/dL (ref 0.44–1.00)
GFR, Estimated: >60 mL/min (ref >60)
Glucose, Bld: 90 mg/dL (ref 70–99)
Potassium: 4.7 mmol/L (ref 3.5–5.1)
Sodium: 146 mmol/L — ABNORMAL HIGH (ref 135–145)
Total Bilirubin: 0.4 mg/dL (ref 0.3–1.2)
Total Protein: 6.6 g/dL (ref 6.5–8.1)

## 2023-03-18 MED ORDER — IOHEXOL 350 MG/ML SOLN
100.0000 mL | Freq: Once | INTRAVENOUS | Status: AC | PRN
  Administered 2023-03-18: 100 mL via INTRAVENOUS

## 2023-03-18 NOTE — Progress Notes (Signed)
Hematology and Oncology Follow Up Visit  Hayley Walters [id number] December 28, 1945 77 y.o. 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 She had repeat CT angio this am which showed resolution of PE. She did have a new nodule on the left lower lobe that radiology suggested we follow-up on.  She was previously a smoker and quit 15 years ago. We will repeat CT in 3 months.  No known familial history of lung cancer.  She has occasional chest discomfort with over exertion. This resolves with taking a break to rest.  She denies any fever, chills, n/v, cough, rash, dizziness, SOB, chest pain, palpitations, abdominal pain or changes in bowel or bladder habits at this time.  No swelling, tenderness, numbness or tingling in her extremities.  No falls or syncope reported.  Appetite and hydration are good. Weight is stable at 157 lbs.   ECOG Performance Status: 1 - Symptomatic but completely ambulatory  Medications:  Allergies as of 03/18/2023       Reactions   Tramadol Nausea And Vomiting        Medication List        Accurate as of March 18, 2023 12:22 PM. If you have any questions, ask your nurse or doctor.          ALPRAZolam 0.5 MG tablet Commonly known as: XANAX Take 0.5 mg by mouth 3 (three) times daily as needed for anxiety.   baclofen 10 MG tablet Commonly known as: LIORESAL TAKE 1/2 BY MOUTH 3 TIMES DAILY AS NEEDED FOR MUSCLE SPASMS   Eliquis 5 MG Tabs tablet Generic drug: apixaban Take 5 mg by mouth 2 (two) times daily.   HYDROcodone-acetaminophen 7.5-325 MG tablet Commonly known as: NORCO Take 1 tablet by mouth every 8 (eight) hours as needed for moderate pain  (cough).   levofloxacin 750 MG tablet Commonly known as: LEVAQUIN Take 750 mg by mouth daily.   predniSONE 10 MG tablet Commonly known as: DELTASONE Take 10 mg by mouth as directed.   rizatriptan 10 MG disintegrating tablet Commonly known as: MAXALT-MLT Take 10 mg by mouth See admin instructions. Take 1 tablet (10 mg) by mouth as needed for migraines, may repeat in 2 hours if needed   sertraline 100 MG tablet Commonly known as: ZOLOFT Take 50 mg by mouth daily.   triamcinolone ointment 0.1 % Commonly known as: KENALOG Apply 1 application topically 2 (two) times daily. To affected areas        Allergies:  Allergies  Allergen Reactions   Tramadol Nausea And Vomiting    Past Medical History, Surgical history, Social history, and Family History were reviewed and updated.  Review of Systems: All other 10 point review of systems is negative.   Physical Exam:  weight is 157 lb 6.4 oz (71.4 kg). Her oral temperature is 98.1 F (36.7 C). Her blood pressure is 157/74 (abnormal) and her pulse is 69. Her respiration is 17 and oxygen saturation is 100%.   Wt Readings from Last 3 Encounters:  03/18/23 157 lb 6.4 oz (71.4 kg)  03/05/23 155 lb (70.3 kg)  01/29/23 150 lb (68 kg)    Ocular: Sclerae unicteric, pupils equal, round and reactive  to light Ear-nose-throat: Oropharynx clear, dentition fair Lymphatic: No cervical or supraclavicular adenopathy Lungs no rales or rhonchi, good excursion bilaterally Heart regular rate and rhythm, no murmur appreciated Abd soft, nontender, positive bowel sounds MSK no focal spinal tenderness, no joint edema Neuro: non-focal, well-oriented, appropriate affect Breasts: Deferred   Lab Results  Component Value Date   WBC 6.7 03/18/2023   HGB 13.2 03/18/2023   HCT 40.8 03/18/2023   MCV 99.3 03/18/2023   PLT 165 03/18/2023   No results found for: "FERRITIN", "IRON", "TIBC", "UIBC", "IRONPCTSAT" Lab Results  Component Value Date   RBC  4.11 03/18/2023   No results found for: "KPAFRELGTCHN", "LAMBDASER", "KAPLAMBRATIO" No results found for: "IGGSERUM", "IGA", "IGMSERUM" Lab Results  Component Value Date   ALBUMINELP 4.1 09/27/2021   MSPIKE Not Observed 09/27/2021     Chemistry      Component Value Date/Time   NA 146 (H) 03/18/2023 0955   NA 144 09/27/2021 1022   K 4.7 03/18/2023 0955   CL 105 03/18/2023 0955   CO2 34 (H) 03/18/2023 0955   BUN 19 03/18/2023 0955   BUN 28 (H) 09/27/2021 1022   CREATININE 0.90 03/18/2023 0955      Component Value Date/Time   CALCIUM 9.5 03/18/2023 0955   ALKPHOS 55 03/18/2023 0955   AST 15 03/18/2023 0955   ALT 15 03/18/2023 0955   BILITOT 0.4 03/18/2023 0955       Impression and Plan: Hayley Walters is a very pleasant 77 yo caucasian female with history of right lower extremity DVT and bilateral PE's in 2022 with Covid. 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  She will complete one year of full dose Eliquis and then transition to maintenance dose.  Follow-up in 3 months with CT same day.   Eileen Stanford, NP 4/16/202412:22 PM

## 2023-03-28 ENCOUNTER — Encounter: Payer: Self-pay | Admitting: Family Medicine

## 2023-04-02 ENCOUNTER — Encounter: Payer: Self-pay | Admitting: Family Medicine

## 2023-04-02 ENCOUNTER — Other Ambulatory Visit: Payer: Self-pay

## 2023-04-02 ENCOUNTER — Ambulatory Visit: Payer: PPO | Admitting: Family Medicine

## 2023-04-02 VITALS — BP 120/80 | Ht 60.0 in | Wt 157.0 lb

## 2023-04-02 DIAGNOSIS — M1652 Unilateral post-traumatic osteoarthritis, left hip: Secondary | ICD-10-CM | POA: Diagnosis not present

## 2023-04-02 MED ORDER — TRIAMCINOLONE ACETONIDE 40 MG/ML IJ SUSP
40.0000 mg | Freq: Once | INTRAMUSCULAR | Status: AC
Start: 2023-04-02 — End: 2023-04-02
  Administered 2023-04-02: 40 mg via INTRA_ARTICULAR

## 2023-04-02 NOTE — Assessment & Plan Note (Signed)
Acute on chronic in nature.  Known degenerative changes within the joint that are exacerbated.  Having limited range of motion. -Counseled on home exercise therapy and supportive care. -Injection today. - Does have follow-up with orthopedist to discuss possible options going forward.

## 2023-04-02 NOTE — Progress Notes (Signed)
Hayley Walters - 77 y.o. female MRN [id number]  Date of birth: [birth year]  SUBJECTIVE:  Including CC & ROS.  No chief complaint on file.   Hayley Walters is a 77 y.o. female that is presenting with acute worsening of her left hip pain.  The pain is in the joint space.  It is causing her to have limited functionality over the past few weeks.  She has trouble getting up from a seated position and extending her hip.    Review of Systems See HPI   HISTORY: Past Medical, Surgical, Social, and Family History Reviewed & Updated per EMR.   Pertinent Historical Findings include:  Past Medical History:  Diagnosis Date   Acute carpal tunnel syndrome of right wrist 07/21/2015   Anemia    Arthritis    Cancer (HCC)    Basal cell    Colon polyps    Depression    situational   Fracture of radius, distal, right, closed 07/21/2015   Headache    migraines   History of kidney stones    IBS (irritable bowel syndrome)    Non-alcoholic fatty liver disease    mild   Osteoporosis    Pneumonia    as a child   Pulmonary emboli (HCC)     Past Surgical History:  Procedure Laterality Date   ABDOMINAL HYSTERECTOMY     BREAST SURGERY     augmentation   CARPAL TUNNEL RELEASE Right 07/21/2015   Procedure: RIGHT CARPAL TUNNEL RELEASE ;  Surgeon: Teryl Lucy, MD;  Location: [facility];  Service: Orthopedics;  Laterality: Right;   COLONOSCOPY     CYSTOSCOPY/URETEROSCOPY/HOLMIUM LASER/STENT PLACEMENT Left 12/05/2020   Procedure: CYSTOSCOPY/LEFT RETROGRADE PLYOGRAM/LEFT DIAGNOSTIC URETEROSCOPY AND STENT PLACEMENT;  Surgeon: Jerilee Field, MD;  Location[facility];  Service: Urology;  Laterality: Left;  ONLY NEEDS 60 MIN   CYSTOSCOPY/URETEROSCOPY/HOLMIUM LASER/STENT PLACEMENT Left 01/12/2021   Procedure: CYSTOSCOPY/RETROGRADE/URETEROSCOPY/HOLMIUM LASER/STENT PLACEMENT;  Surgeon: Jerilee Field, MD;  [facility];  Service: Urology;  Laterality: Left;  ONLY NEEDS 70 MIN   FRACTURE SURGERY  Left    hip/femur   FRACTURE SURGERY Right    screws in right hip for stress fracture   OPEN REDUCTION INTERNAL FIXATION (ORIF) DISTAL RADIAL FRACTURE Right 07/21/2015   Procedure: OPEN REDUCTION INTERNAL FIXATION (ORIF) RIGHT DISTAL RADIAL FRACTURE;  Surgeon: Teryl Lucy, MD;  Location: [facility];  Service: Orthopedics;  Laterality: Right;   TONSILLECTOMY       PHYSICAL EXAM:  VS: BP 120/80 (BP Location: Left Arm, Patient Position: Sitting)   Ht 5' (1.524 m)   Wt 157 lb (71.2 kg)   BMI 30.66 kg/m  Physical Exam Gen: NAD, alert, cooperative with exam, well-appearing MSK:  Neurovascularly intact     Aspiration/Injection Procedure Note KIAIRA POINTER 1946-05-05  Procedure: Injection Indications: Left hip pain  Procedure Details Consent: Risks of procedure as well as the alternatives and risks of each were explained to the (patient/caregiver).  Consent for procedure obtained. Time Out: Verified patient identification, verified procedure, site/side was marked, verified correct patient position, special equipment/implants available, medications/allergies/relevent history reviewed, required imaging and test results available.  Performed.  The area was cleaned with iodine and alcohol swabs.    The left hip joint was injected using 8 cc of 1% lidocaine and 0.8 cc of 8.4% sodium bicarbonate on a 22-gauge 3-1/2 inch needle.  The syringe was switched to mixture containing 1 cc's of 40 mg Kenalog and 4 cc's of  0.25% bupivacaine was injected.  Ultrasound was used. Images were obtained in short views showing the injection.     A sterile dressing was applied.  Patient did tolerate procedure well.     ASSESSMENT & PLAN:   Post-traumatic osteoarthritis of left hip Acute on chronic in nature.  Known degenerative changes within the joint that are exacerbated.  Having limited range of motion. -Counseled on home exercise therapy and supportive care. -Injection today. - Does  have follow-up with orthopedist to discuss possible options going forward.

## 2023-04-02 NOTE — Patient Instructions (Signed)
Good to see you Please alternate heat and ice  Let me know what happens with your appointment  Please send me a message in MyChart with any questions or updates.  Please see me back as needed.   --Dr. Jordan Likes

## 2023-04-03 ENCOUNTER — Encounter: Payer: Self-pay | Admitting: Family Medicine

## 2023-04-04 ENCOUNTER — Ambulatory Visit: Payer: PPO | Admitting: Podiatry

## 2023-04-04 DIAGNOSIS — L84 Corns and callosities: Secondary | ICD-10-CM

## 2023-04-04 MED ORDER — UREA 10 % EX CREA: TOPICAL_CREAM | CUTANEOUS | 0 refills | Status: AC | PRN

## 2023-04-04 NOTE — Progress Notes (Signed)
  Subjective:  Patient ID: Hayley Walters, female    DOB: [birth year],  MRN: [id number]  Chief Complaint  Patient presents with   Callouses    Corn to left foot in between 4th and 5th toe.     77 y.o. female presents with painful hyperkeratotic lesion in between the fourth and fifth toe on the left foot.  She says this area irritates her with pressure between the toes.  Past Medical History:  Diagnosis Date   Acute carpal tunnel syndrome of right wrist 07/21/2015   Anemia    Arthritis    Cancer (HCC)    Basal cell    Colon polyps    Depression    situational   Fracture of radius, distal, right, closed 07/21/2015   Headache    migraines   History of kidney stones    IBS (irritable bowel syndrome)    Non-alcoholic fatty liver disease    mild   Osteoporosis    Pneumonia    as a child   Pulmonary emboli (HCC)     Allergies  Allergen Reactions   Tramadol Nausea And Vomiting    ROS: Negative except as per HPI above  Objective:  General: AAO x3, NAD  Dermatological: Hyperkeratotic lesion in the 4th interdigital space. Pain on palpation of the lesion.   Vascular:  Dorsalis Pedis artery and Posterior Tibial artery pedal pulses are 2/4 bilateral.  Capillary fill time < 3 sec to all digits.   Neruologic: Grossly intact via light touch bilateral. Protective threshold intact to all sites bilateral.   Musculoskeletal: No gross boney pedal deformities bilateral. No pain, crepitus, or limitation noted with foot and ankle range of motion bilateral. Muscular strength 5/5 in all groups tested bilateral.  Gait: Unassisted, Nonantalgic.   No images are attached to the encounter.   Assessment:   1. Corn of toe      Plan:  Patient was evaluated and treated and all questions answered.  # InterDigital hyperkeratotic lesion in between the fourth and fifth toe on the left foot All symptomatic hyperkeratoses  x1 were safely debrided with a sterile #15 blade to patient's level of  comfort without incident. We discussed preventative and palliative care of these lesions including supportive and accommodative shoegear, padding, prefabricated and custom molded accommodative orthoses, use of a pumice stone and lotions/creams daily. -Erx for urea cream sent to the patients pharmacy  Return if symptoms worsen or fail to improve.          Corinna Gab, DPM [facility] / [facility]

## 2023-04-14 ENCOUNTER — Ambulatory Visit: Payer: PPO | Admitting: Family Medicine

## 2023-04-14 ENCOUNTER — Other Ambulatory Visit: Payer: Self-pay | Admitting: Family Medicine

## 2023-04-14 DIAGNOSIS — M25551 Pain in right hip: Secondary | ICD-10-CM | POA: Diagnosis not present

## 2023-04-14 DIAGNOSIS — S72142D Displaced intertrochanteric fracture of left femur, subsequent encounter for closed fracture with routine healing: Secondary | ICD-10-CM | POA: Diagnosis not present

## 2023-04-14 DIAGNOSIS — M25552 Pain in left hip: Secondary | ICD-10-CM | POA: Diagnosis not present

## 2023-04-14 DIAGNOSIS — M1612 Unilateral primary osteoarthritis, left hip: Secondary | ICD-10-CM | POA: Diagnosis not present

## 2023-04-18 ENCOUNTER — Encounter: Payer: Self-pay | Admitting: Family Medicine

## 2023-04-21 ENCOUNTER — Ambulatory Visit: Payer: PPO | Admitting: Family Medicine

## 2023-04-21 ENCOUNTER — Encounter: Payer: Self-pay | Admitting: Family Medicine

## 2023-04-21 ENCOUNTER — Other Ambulatory Visit: Payer: Self-pay | Admitting: Family Medicine

## 2023-04-21 VITALS — BP 130/80 | Ht 60.0 in | Wt 157.0 lb

## 2023-04-21 DIAGNOSIS — M1652 Unilateral post-traumatic osteoarthritis, left hip: Secondary | ICD-10-CM

## 2023-04-21 MED ORDER — HYDROCODONE-ACETAMINOPHEN 7.5-325 MG PO TABS: 1.0000 | ORAL_TABLET | Freq: Three times a day (TID) | ORAL | 0 refills | Status: DC | PRN

## 2023-04-21 NOTE — Progress Notes (Signed)
  Hayley Walters - 77 y.o. female MRN [id number]  Date of birth: October 02, 1946  SUBJECTIVE:  Including CC & ROS.  No chief complaint on file.   Hayley Walters is a 77 y.o. female that is following up for left hip pain.  Has been doing well since the steroid injection.    Review of Systems See HPI   HISTORY: Past Medical, Surgical, Social, and Family History Reviewed & Updated per EMR.   Pertinent Historical Findings include:  Past Medical History:  Diagnosis Date   Acute carpal tunnel syndrome of right wrist 07/21/2015   Anemia    Arthritis    Cancer (HCC)    Basal cell    Colon polyps    Depression    situational   Fracture of radius, distal, right, closed 07/21/2015   Headache    migraines   History of kidney stones    IBS (irritable bowel syndrome)    Non-alcoholic fatty liver disease    mild   Osteoporosis    Pneumonia    as a child   Pulmonary emboli (HCC)     Past Surgical History:  Procedure Laterality Date   ABDOMINAL HYSTERECTOMY     BREAST SURGERY     augmentation   CARPAL TUNNEL RELEASE Right 07/21/2015   Procedure: RIGHT CARPAL TUNNEL RELEASE ;  Surgeon: Teryl Lucy, MD;  Location: [facility];  Service: Orthopedics;  Laterality: Right;   COLONOSCOPY     CYSTOSCOPY/URETEROSCOPY/HOLMIUM LASER/STENT PLACEMENT Left 12/05/2020   Procedure: CYSTOSCOPY/LEFT RETROGRADE PLYOGRAM/LEFT DIAGNOSTIC URETEROSCOPY AND STENT PLACEMENT;  Surgeon: Jerilee Field, MD;  Location: [facility];  Service: Urology;  Laterality: Left;  ONLY NEEDS 60 MIN   CYSTOSCOPY/URETEROSCOPY/HOLMIUM LASER/STENT PLACEMENT Left 01/12/2021   Procedure: CYSTOSCOPY/RETROGRADE/URETEROSCOPY/HOLMIUM LASER/STENT PLACEMENT;  Surgeon: Jerilee Field, MD;  Location: [facility];  Service: Urology;  Laterality: Left;  ONLY NEEDS 70 MIN   FRACTURE SURGERY Left    hip/femur   FRACTURE SURGERY Right    screws in right hip for stress fracture   OPEN REDUCTION INTERNAL FIXATION (ORIF) DISTAL RADIAL FRACTURE  Right 07/21/2015   Procedure: OPEN REDUCTION INTERNAL FIXATION (ORIF) RIGHT DISTAL RADIAL FRACTURE;  Surgeon: Teryl Lucy, MD;  Location: [facility];  Service: Orthopedics;  Laterality: Right;   TONSILLECTOMY       PHYSICAL EXAM:  VS: BP 130/80 (BP Location: Right Arm, Patient Position: Sitting)   Ht 5' (1.524 m)   Wt 157 lb (71.2 kg)   BMI 30.66 kg/m  Physical Exam Gen: NAD, alert, cooperative with exam, well-appearing MSK:  Neurovascularly intact       ASSESSMENT & PLAN:   Post-traumatic osteoarthritis of left hip Doing well since the injection.  Pain is still occurring intermittently and does cause her to limp and diminished functionality. -Counseled on home exercise therapy and supportive care. -Can perform injections as needed. -Could consider second opinion for arthroplasty

## 2023-04-21 NOTE — Assessment & Plan Note (Signed)
Doing well since the injection.  Pain is still occurring intermittently and does cause her to limp and diminished functionality. -Counseled on home exercise therapy and supportive care. -Can perform injections as needed. -Could consider second opinion for arthroplasty

## 2023-04-23 NOTE — Addendum Note (Signed)
Addended by: Myra Rude on: 04/23/2023 02:06 PM   Modules accepted: Orders

## 2023-05-09 DIAGNOSIS — M1612 Unilateral primary osteoarthritis, left hip: Secondary | ICD-10-CM | POA: Diagnosis not present

## 2023-06-03 NOTE — Telephone Encounter (Signed)
Prolia VOB initiated via [email or web address]  Last OV:  Next OV:  Last Prolia inj:  Next Prolia inj DUE: 07/31/23

## 2023-06-09 NOTE — Telephone Encounter (Signed)
Pt ready for scheduling on or after 07/31/23  Out-of-pocket cost due at time of visit: $327  Primary: [employer] Adv Prolia co-insurance: 20% (approximately $302) Admin fee co-insurance: 20% (approximately $25)  Deductible: does not apply  Prior Auth: NOT required  Secondary: N/A Prolia co-insurance:  Admin fee co-insurance:  Deductible:  Prior Auth:  PA# Valid:   ** This summary of benefits is an estimation of the patient's out-of-pocket cost. Exact cost may vary based on individual plan coverage.

## 2023-06-17 ENCOUNTER — Ambulatory Visit (HOSPITAL_BASED_OUTPATIENT_CLINIC_OR_DEPARTMENT_OTHER)
Admission: RE | Admit: 2023-06-17 | Discharge: 2023-06-17 | Disposition: A | Discharge status: Home / Self Care | Payer: PPO | Source: Ambulatory Visit | Attending: Family | Admitting: Family

## 2023-06-17 ENCOUNTER — Inpatient Hospital Stay (HOSPITAL_BASED_OUTPATIENT_CLINIC_OR_DEPARTMENT_OTHER): Payer: PPO | Admitting: Family

## 2023-06-17 ENCOUNTER — Encounter: Payer: Self-pay | Admitting: Family

## 2023-06-17 ENCOUNTER — Other Ambulatory Visit: Payer: Self-pay | Admitting: Family

## 2023-06-17 ENCOUNTER — Inpatient Hospital Stay: Payer: PPO | Attending: Hematology & Oncology

## 2023-06-17 ENCOUNTER — Encounter (HOSPITAL_BASED_OUTPATIENT_CLINIC_OR_DEPARTMENT_OTHER): Payer: Self-pay

## 2023-06-17 VITALS — BP 110/52 | HR 72 | Temp 98.0°F | Resp 17 | Wt 159.8 lb

## 2023-06-17 DIAGNOSIS — Z79899 Other long term (current) drug therapy: Secondary | ICD-10-CM | POA: Diagnosis not present

## 2023-06-17 DIAGNOSIS — I2699 Other pulmonary embolism without acute cor pulmonale: Secondary | ICD-10-CM | POA: Insufficient documentation

## 2023-06-17 DIAGNOSIS — I700 Atherosclerosis of aorta: Secondary | ICD-10-CM | POA: Diagnosis not present

## 2023-06-17 DIAGNOSIS — R911 Solitary pulmonary nodule: Secondary | ICD-10-CM | POA: Insufficient documentation

## 2023-06-17 DIAGNOSIS — Z86718 Personal history of other venous thrombosis and embolism: Secondary | ICD-10-CM | POA: Insufficient documentation

## 2023-06-17 DIAGNOSIS — R918 Other nonspecific abnormal finding of lung field: Secondary | ICD-10-CM | POA: Diagnosis not present

## 2023-06-17 DIAGNOSIS — Z7901 Long term (current) use of anticoagulants: Secondary | ICD-10-CM | POA: Diagnosis not present

## 2023-06-17 DIAGNOSIS — I2782 Chronic pulmonary embolism: Secondary | ICD-10-CM | POA: Insufficient documentation

## 2023-06-17 DIAGNOSIS — Z8616 Personal history of COVID-19: Secondary | ICD-10-CM | POA: Insufficient documentation

## 2023-06-17 LAB — CMP (CANCER CENTER ONLY)
ALT: 12 U/L (ref 0–44)
AST: 17 U/L (ref 15–41)
Albumin: 4.5 g/dL (ref 3.5–5.0)
Alkaline Phosphatase: 50 U/L (ref 38–126)
Anion gap: 10 (ref 5–15)
BUN: 39 mg/dL — ABNORMAL HIGH (ref 8–23)
CO2: 25 mmol/L (ref 22–32)
Calcium: 9.8 mg/dL (ref 8.9–10.3)
Chloride: 106 mmol/L (ref 98–111)
Creatinine: 0.88 mg/dL (ref 0.44–1.00)
GFR, Estimated: >60 mL/min (ref >60)
Glucose, Bld: 89 mg/dL (ref 70–99)
Potassium: 5 mmol/L (ref 3.5–5.1)
Sodium: 141 mmol/L (ref 135–145)
Total Bilirubin: 0.5 mg/dL (ref 0.3–1.2)
Total Protein: 6.8 g/dL (ref 6.5–8.1)

## 2023-06-17 LAB — CBC WITH DIFFERENTIAL (CANCER CENTER ONLY)
Abs Immature Granulocytes: 0.01 10*3/uL (ref 0.00–0.07)
Basophils Absolute: 0 10*3/uL (ref 0.0–0.1)
Basophils Relative: 1 %
Eosinophils Absolute: 0 10*3/uL (ref 0.0–0.5)
Eosinophils Relative: 0 %
HCT: 37 % (ref 36.0–46.0)
Hemoglobin: 12 g/dL (ref 12.0–15.0)
Immature Granulocytes: 0 %
Lymphocytes Relative: 32 %
Lymphs Abs: 1.4 10*3/uL (ref 0.7–4.0)
MCH: 31.8 pg (ref 26.0–34.0)
MCHC: 32.4 g/dL (ref 30.0–36.0)
MCV: 98.1 fL (ref 80.0–100.0)
Monocytes Absolute: 0.5 10*3/uL (ref 0.1–1.0)
Monocytes Relative: 12 %
Neutro Abs: 2.3 10*3/uL (ref 1.7–7.7)
Neutrophils Relative %: 55 %
Platelet Count: 156 10*3/uL (ref 150–400)
RBC: 3.77 MIL/uL — ABNORMAL LOW (ref 3.87–5.11)
RDW: 12.4 % (ref 11.5–15.5)
WBC Count: 4.3 10*3/uL (ref 4.0–10.5)
nRBC: 0 % (ref 0.0–0.2)

## 2023-06-17 NOTE — Progress Notes (Signed)
Hematology and Oncology Follow Up Visit  Hayley Walters [id number] September 29, 1946 77 y.o. 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 She is doing fairly well but notes persistent pack pain that radiates down the left leg at times. She has an appointment with a new orthopedist to discuss  Possible hip replacement with her history of osteoporosis. She is hoping this is an option for her.  No falls or syncope reported.  She is scheduled for repeat CT of the chest this afternoon to re-evaluate left lower lobe pulmonary nodule noted several months ago on CT angio.  She denies fever, chills, n/v, cough, rash, dizziness, SOB,  palpitations, abdominal pain or changes in bowel or bladder habits. She has random episodes of mid sternal chest discomfort that will radiate up both sides of her neck. She states that these episodes last around 2-4 minutes and resolve on their own. Patient advised to discuss with PCP or go to the ED if she feels dizziness, pain radiating down her left arm or SOB. She is not symptomatic at this time.  No swelling in her extremities at this time.  No falls or syncope reported.  Appetite and hydration have been good. Weight is stable at 159 lbs.   ECOG Performance Status: 1 - Symptomatic but completely ambulatory  Medications:  Allergies as of 06/17/2023       Reactions   Tramadol Nausea And Vomiting        Medication List        Accurate as of Walters 16, 2024 12:03 PM. If you have any questions, ask your nurse or doctor.          ALPRAZolam 0.5 MG tablet Commonly known as: XANAX Take 0.5 mg by mouth 3 (three) times daily as needed for anxiety.   baclofen 10 MG  tablet Commonly known as: LIORESAL TAKE 1/2 BY MOUTH 3 TIMES DAILY AS NEEDED FOR MUSCLE SPASMS   Eliquis 5 MG Tabs tablet Generic drug: apixaban Take 5 mg by mouth 2 (two) times daily.   HYDROcodone-acetaminophen 7.5-325 MG tablet Commonly known as: NORCO Take 1 tablet by mouth every 8 (eight) hours as needed for moderate pain (cough).   rizatriptan 10 MG disintegrating tablet Commonly known as: MAXALT-MLT Take 10 mg by mouth See admin instructions. Take 1 tablet (10 mg) by mouth as needed for migraines, may repeat in 2 hours if needed   sertraline 100 MG tablet Commonly known as: ZOLOFT Take 50 mg by mouth daily.   triamcinolone ointment 0.1 % Commonly known as: KENALOG Apply 1 application topically 2 (two) times daily. To affected areas   urea 10 % cream Commonly known as: CARMOL Apply topically as needed.        Allergies:  Allergies  Allergen Reactions   Tramadol Nausea And Vomiting    Past Medical History, Surgical history, Social history, and Family History were reviewed and updated.  Review of Systems: All other 10 point review of systems is negative.   Physical Exam:  weight is 159 lb 12.8 oz (72.5 kg). Her oral temperature is 98 F (36.7 C). Her blood pressure is 110/52 (abnormal) and her pulse is 72. Her respiration  is 17 and oxygen saturation is 99%.   Wt Readings from Last 3 Encounters:  06/17/23 159 lb 12.8 oz (72.5 kg)  04/21/23 157 lb (71.2 kg)  04/02/23 157 lb (71.2 kg)    Ocular: Sclerae unicteric, pupils equal, round and reactive to light Ear-nose-throat: Oropharynx clear, dentition fair Lymphatic: No cervical or supraclavicular adenopathy Lungs no rales or rhonchi, good excursion bilaterally Heart regular rate and rhythm, no murmur appreciated Abd soft, nontender, positive bowel sounds MSK no focal spinal tenderness, no joint edema Neuro: non-focal, well-oriented, appropriate affect Breasts: Deferred   Lab Results  Component Value  Date   WBC 4.3 06/17/2023   HGB 12.0 06/17/2023   HCT 37.0 06/17/2023   MCV 98.1 06/17/2023   PLT 156 06/17/2023   No results found for: "FERRITIN", "IRON", "TIBC", "UIBC", "IRONPCTSAT" Lab Results  Component Value Date   RBC 3.77 (L) 06/17/2023   No results found for: "KPAFRELGTCHN", "LAMBDASER", "KAPLAMBRATIO" No results found for: "IGGSERUM", "IGA", "IGMSERUM" Lab Results  Component Value Date   ALBUMINELP 4.1 09/27/2021   MSPIKE Not Observed 09/27/2021     Chemistry      Component Value Date/Time   NA 141 06/17/2023 1124   NA 144 09/27/2021 1022   K 5.0 06/17/2023 1124   CL 106 06/17/2023 1124   CO2 25 06/17/2023 1124   BUN 39 (H) 06/17/2023 1124   BUN 28 (H) 09/27/2021 1022   CREATININE 0.88 06/17/2023 1124      Component Value Date/Time   CALCIUM 9.8 06/17/2023 1124   ALKPHOS 50 06/17/2023 1124   AST 17 06/17/2023 1124   ALT 12 06/17/2023 1124   BILITOT 0.5 06/17/2023 1124       Impression and Plan:  Hayley Walters is a very pleasant 77 yo caucasian female with history of right lower extremity DVT and bilateral PE's in 2022 with Covid. 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  She will complete one year of full dose Eliquis and then transition to maintenance dose in 12/2023.  Follow-up in 4 months.   Hayley Stanford, NP 7/16/202412:03 PM

## 2023-06-19 DIAGNOSIS — M25552 Pain in left hip: Secondary | ICD-10-CM | POA: Diagnosis not present

## 2023-06-26 DIAGNOSIS — M1612 Unilateral primary osteoarthritis, left hip: Secondary | ICD-10-CM | POA: Diagnosis not present

## 2023-07-01 ENCOUNTER — Telehealth: Payer: Self-pay | Admitting: *Deleted

## 2023-07-01 NOTE — Telephone Encounter (Signed)
As noted below by Eileen Stanford, NP, I informed the patient that her PE is improving. Please continue the same regimen with Eliquis. She verbalized understanding.

## 2023-07-01 NOTE — Telephone Encounter (Signed)
-----   Message from Eileen Stanford sent at 07/01/2023  9:20 AM EDT ----- Well done!!! PE is improving!!! Continue same regimen with Eliquis. ----- Message ----- From: Interface, Rad Results In Sent: 06/25/2023   1:18 PM EDT To: Erenest Blank, NP

## 2023-07-09 DIAGNOSIS — M1612 Unilateral primary osteoarthritis, left hip: Secondary | ICD-10-CM | POA: Diagnosis not present

## 2023-07-31 IMAGING — US US FNA BIOPSY THYROID 1ST LESION
1 series · 12 of 12 positions shown · non-contrast
Comparison: US 04/25/21

MEDICATIONS:
5 cc 1% lidocaine

COMPLICATIONS:
None immediate.

INDICATION: Left superior thyroid nodule

2.8 cm
EXAM:
ULTRASOUND GUIDED FINE NEEDLE ASPIRATION OF INDETERMINATE THYROID
NODULE
TECHNIQUE: Informed written consent was obtained from the patient after a
discussion of the risks, benefits and alternatives to treatment.
Questions regarding the procedure were encouraged and answered. A
timeout was performed prior to the initiation of the procedure.

[Series 1: us fna biopsy thyroid 1st lesion · 0.04mm/px · 12 acquisitions, 12 frames shown]
[im 1/12]
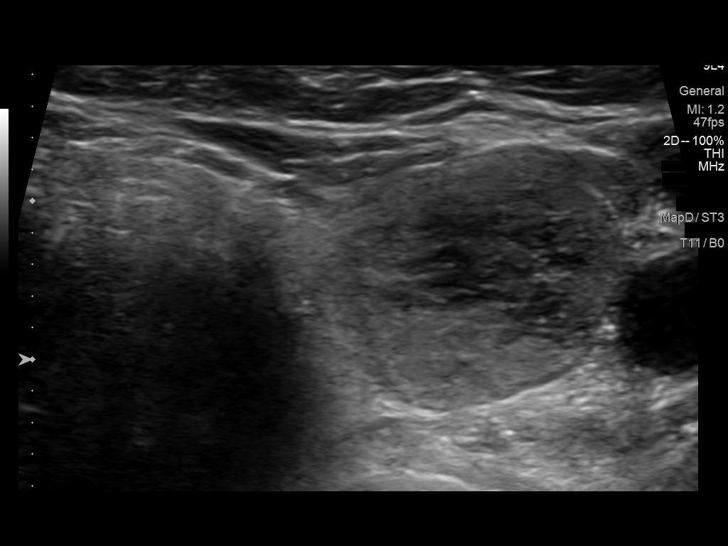
[im 2/12]
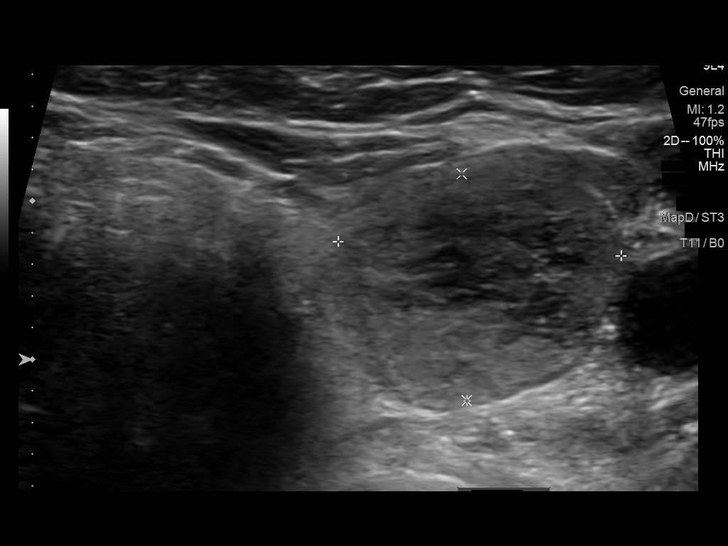
[im 3/12]
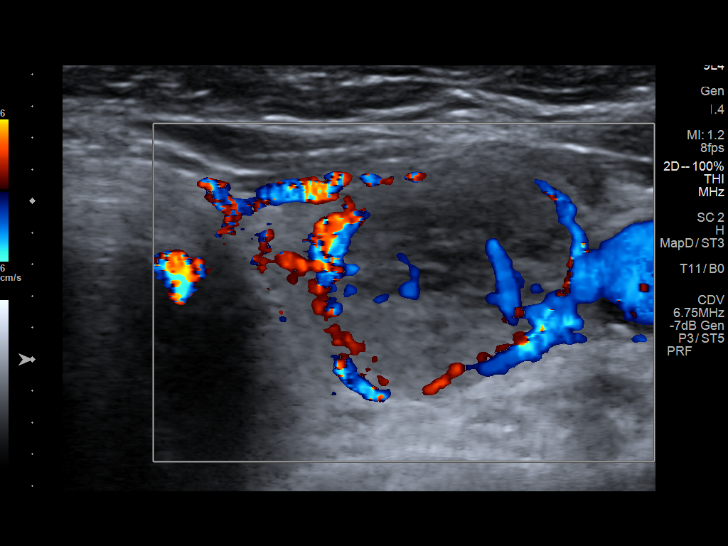
[im 4/12]
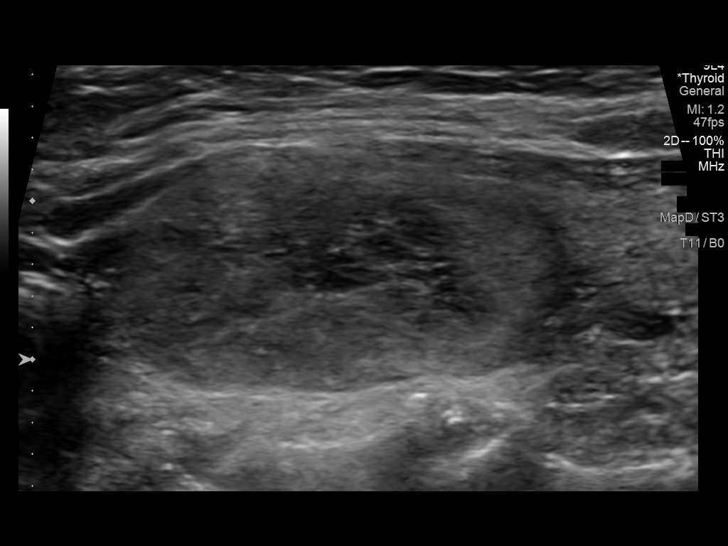
[im 5/12]
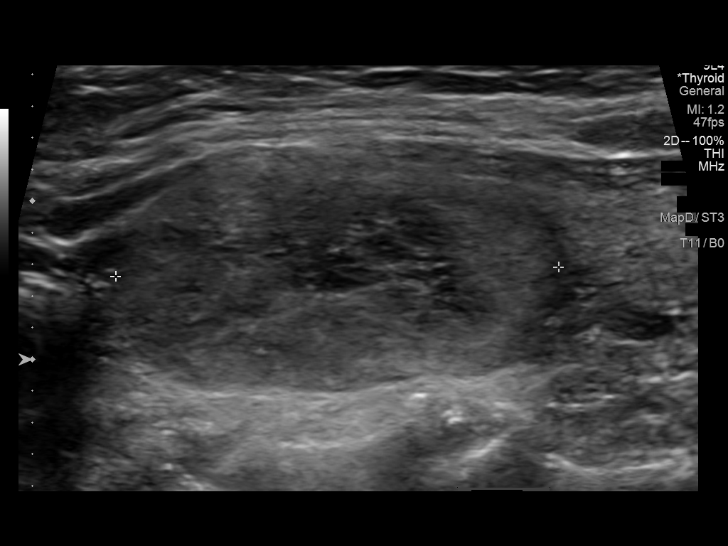
[im 6/12]
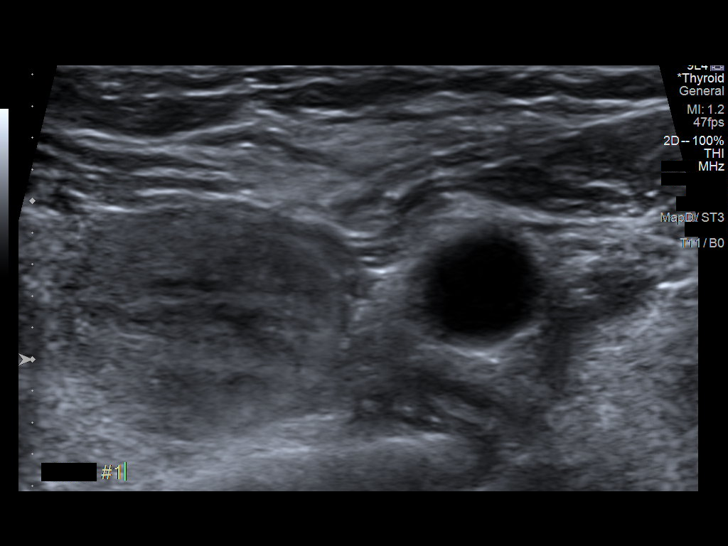
[im 7/12]
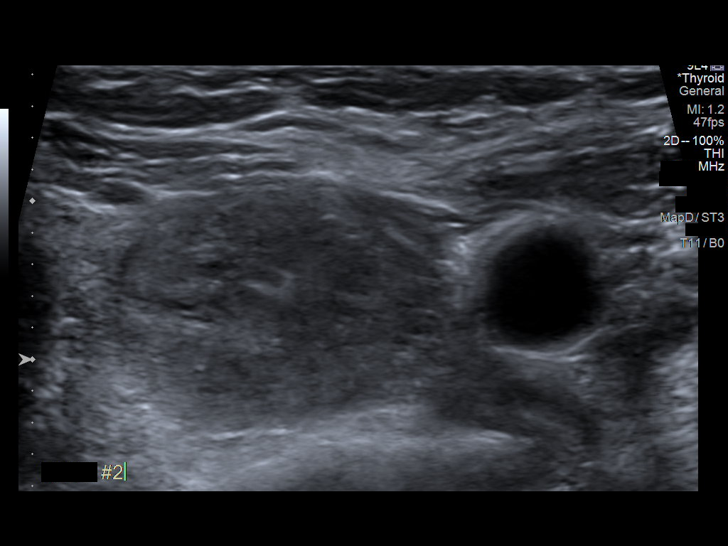
[im 8/12]
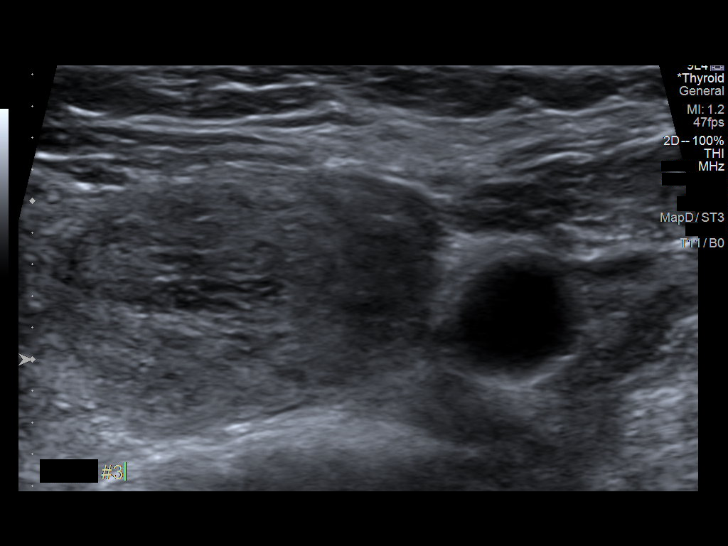
[im 9/12]
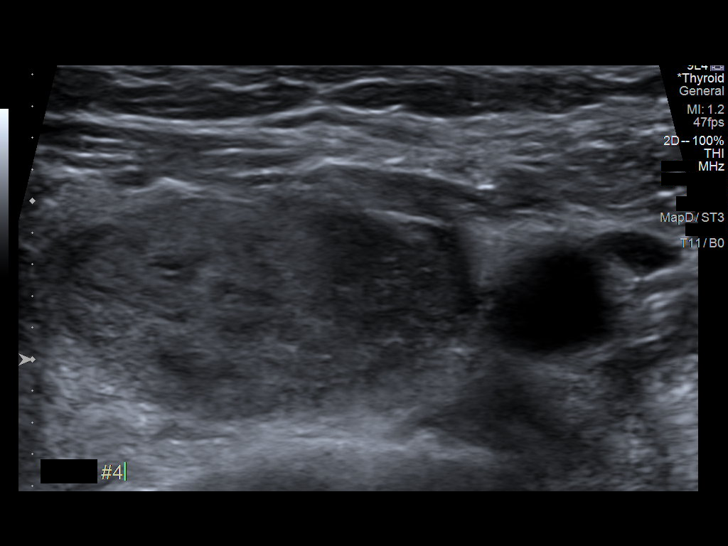
[im 10/12]
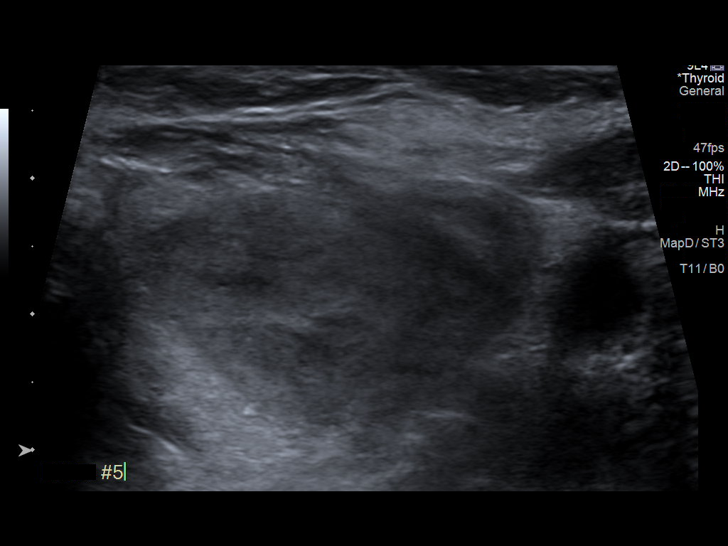
[im 11/12]
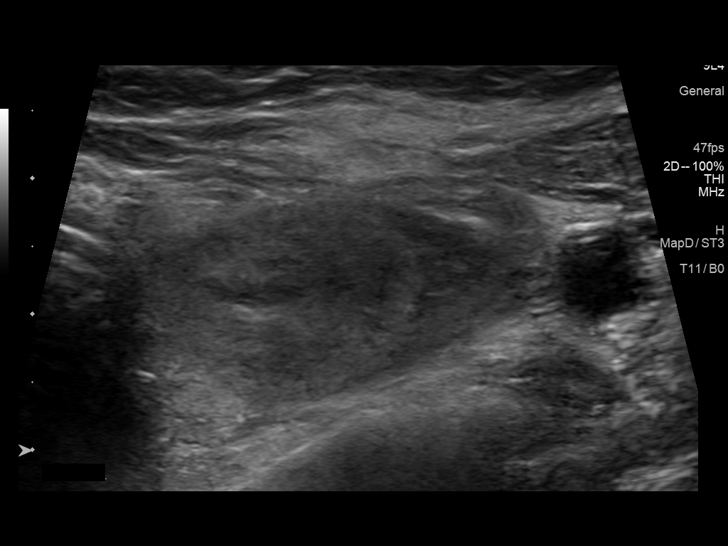
[im 12/12]
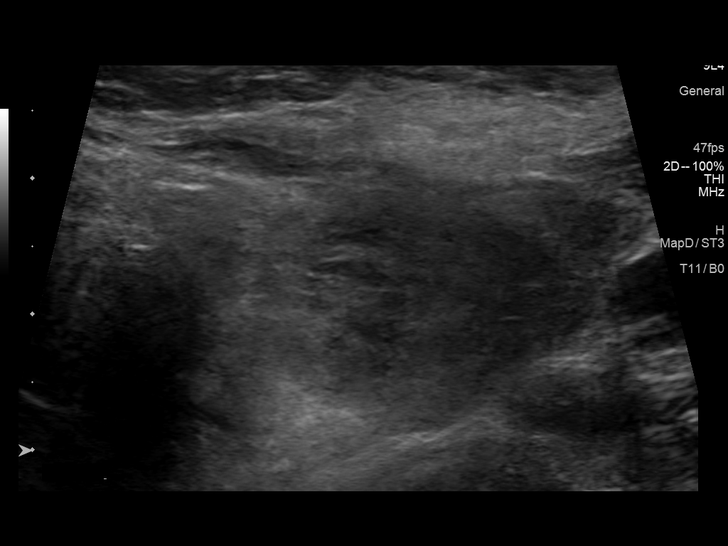

[12 of 12 positions shown; findings below may reference images not displayed]

Pre-procedural ultrasound scanning demonstrated unchanged size and
appearance of the indeterminate nodule within the left thyroid

The procedure was planned. The neck was prepped in the usual sterile
fashion, and a sterile drape was applied covering the operative
field. A timeout was performed prior to the initiation of the
procedure. Local anesthesia was provided with 1% lidocaine.

Under direct ultrasound guidance, 5 FNA biopsies were performed of
the left superior thyroid nodule with a 27 gauge needle.

2 of these samples were obtained for AFIRMA.

Multiple ultrasound images were saved for procedural documentation
purposes. The samples were prepared and submitted to pathology.

Limited post procedural scanning was negative for hematoma or
additional complication. Dressings were placed. The patient
tolerated the above procedures procedure well without immediate
postprocedural complication.
FINDINGS: Nodule reference number based on prior diagnostic ultrasound: 2

Maximum size: 2.8 cm

Location: Left; Superior

ACR TI-RADS risk category: TR4 (4-6 points)

Reason for biopsy: meets ACR TI-RADS criteria

Ultrasound imaging confirms appropriate placement of the needles
within the thyroid nodule.
IMPRESSION: Technically successful ultrasound guided fine needle aspiration of
left superior thyroid nodule

Read by

Bambucafe Tarla

## 2023-08-08 DIAGNOSIS — K5732 Diverticulitis of large intestine without perforation or abscess without bleeding: Secondary | ICD-10-CM | POA: Diagnosis not present

## 2023-08-08 DIAGNOSIS — S86911A Strain of unspecified muscle(s) and tendon(s) at lower leg level, right leg, initial encounter: Secondary | ICD-10-CM | POA: Diagnosis not present

## 2023-08-18 DIAGNOSIS — M79671 Pain in right foot: Secondary | ICD-10-CM | POA: Diagnosis not present

## 2023-08-25 DIAGNOSIS — M1612 Unilateral primary osteoarthritis, left hip: Secondary | ICD-10-CM | POA: Diagnosis not present

## 2023-08-25 DIAGNOSIS — M5416 Radiculopathy, lumbar region: Secondary | ICD-10-CM | POA: Diagnosis not present

## 2023-09-01 DIAGNOSIS — M79671 Pain in right foot: Secondary | ICD-10-CM | POA: Diagnosis not present

## 2023-09-06 NOTE — Telephone Encounter (Signed)
Pt > 30 days past due for Prolia injection.   If you would like for pt to continue with Prolia therapy, please have clinical staff reach out to pt for scheduling and to explain to importance of receiving Prolia injections every 6 months as abrupt cessation of Prolia raises risk of osteoporotic fracture.    "Discontinuation of Dmab is associated with a 3- to 5-fold higher risk for vertebral, major osteoporotic, and hip fractures [38,39]."   [email or web address]

## 2023-09-08 DIAGNOSIS — Z9889 Other specified postprocedural states: Secondary | ICD-10-CM | POA: Diagnosis not present

## 2023-09-08 DIAGNOSIS — T84115A Breakdown (mechanical) of internal fixation device of left femur, initial encounter: Secondary | ICD-10-CM | POA: Diagnosis not present

## 2023-09-08 DIAGNOSIS — R936 Abnormal findings on diagnostic imaging of limbs: Secondary | ICD-10-CM | POA: Diagnosis not present

## 2023-09-08 DIAGNOSIS — M1612 Unilateral primary osteoarthritis, left hip: Secondary | ICD-10-CM | POA: Diagnosis not present

## 2023-09-08 DIAGNOSIS — Z8781 Personal history of (healed) traumatic fracture: Secondary | ICD-10-CM | POA: Diagnosis not present

## 2023-09-10 DIAGNOSIS — Z8781 Personal history of (healed) traumatic fracture: Secondary | ICD-10-CM | POA: Diagnosis not present

## 2023-09-10 DIAGNOSIS — M5416 Radiculopathy, lumbar region: Secondary | ICD-10-CM | POA: Diagnosis not present

## 2023-09-10 DIAGNOSIS — Z86711 Personal history of pulmonary embolism: Secondary | ICD-10-CM | POA: Diagnosis not present

## 2023-09-10 DIAGNOSIS — M1612 Unilateral primary osteoarthritis, left hip: Secondary | ICD-10-CM | POA: Diagnosis not present

## 2023-09-10 DIAGNOSIS — M810 Age-related osteoporosis without current pathological fracture: Secondary | ICD-10-CM | POA: Diagnosis not present

## 2023-09-10 DIAGNOSIS — Z79899 Other long term (current) drug therapy: Secondary | ICD-10-CM | POA: Diagnosis not present

## 2023-09-12 DIAGNOSIS — M810 Age-related osteoporosis without current pathological fracture: Secondary | ICD-10-CM | POA: Diagnosis not present

## 2023-09-15 DIAGNOSIS — Z0000 Encounter for general adult medical examination without abnormal findings: Secondary | ICD-10-CM | POA: Diagnosis not present

## 2023-09-15 DIAGNOSIS — Z9181 History of falling: Secondary | ICD-10-CM | POA: Diagnosis not present

## 2023-09-23 DIAGNOSIS — M810 Age-related osteoporosis without current pathological fracture: Secondary | ICD-10-CM | POA: Diagnosis not present

## 2023-09-23 DIAGNOSIS — Z780 Asymptomatic menopausal state: Secondary | ICD-10-CM | POA: Diagnosis not present

## 2023-09-26 NOTE — Telephone Encounter (Signed)
I called pt- she has been seeing Hollie Beach at [facility]. She has started patient on Evenity. She is having left hip arthoplasty 12/16/23 with Dr. Rolley Sims.

## 2023-10-10 DIAGNOSIS — M810 Age-related osteoporosis without current pathological fracture: Secondary | ICD-10-CM | POA: Diagnosis not present

## 2023-10-15 ENCOUNTER — Inpatient Hospital Stay: Payer: PPO | Attending: Hematology & Oncology

## 2023-10-15 ENCOUNTER — Inpatient Hospital Stay (HOSPITAL_BASED_OUTPATIENT_CLINIC_OR_DEPARTMENT_OTHER): Payer: PPO | Admitting: Hematology & Oncology

## 2023-10-15 ENCOUNTER — Encounter: Payer: Self-pay | Admitting: Hematology & Oncology

## 2023-10-15 VITALS — BP 136/59 | HR 96 | Temp 98.2°F | Resp 18 | Ht 60.0 in | Wt 162.0 lb

## 2023-10-15 DIAGNOSIS — Z8616 Personal history of COVID-19: Secondary | ICD-10-CM | POA: Insufficient documentation

## 2023-10-15 DIAGNOSIS — Z79899 Other long term (current) drug therapy: Secondary | ICD-10-CM | POA: Diagnosis not present

## 2023-10-15 DIAGNOSIS — R0789 Other chest pain: Secondary | ICD-10-CM

## 2023-10-15 DIAGNOSIS — Z86718 Personal history of other venous thrombosis and embolism: Secondary | ICD-10-CM | POA: Diagnosis not present

## 2023-10-15 DIAGNOSIS — M549 Dorsalgia, unspecified: Secondary | ICD-10-CM | POA: Diagnosis not present

## 2023-10-15 DIAGNOSIS — Z7901 Long term (current) use of anticoagulants: Secondary | ICD-10-CM | POA: Insufficient documentation

## 2023-10-15 DIAGNOSIS — I2699 Other pulmonary embolism without acute cor pulmonale: Secondary | ICD-10-CM

## 2023-10-15 DIAGNOSIS — I2782 Chronic pulmonary embolism: Secondary | ICD-10-CM | POA: Insufficient documentation

## 2023-10-15 LAB — CBC WITH DIFFERENTIAL (CANCER CENTER ONLY)
Abs Immature Granulocytes: 0.01 K/uL (ref 0.00–0.07)
Basophils Absolute: 0 K/uL (ref 0.0–0.1)
Basophils Relative: 1 %
Eosinophils Absolute: 0.1 K/uL (ref 0.0–0.5)
Eosinophils Relative: 2 %
HCT: 40.3 % (ref 36.0–46.0)
Hemoglobin: 13.2 g/dL (ref 12.0–15.0)
Immature Granulocytes: 0 %
Lymphocytes Relative: 27 %
Lymphs Abs: 1.7 K/uL (ref 0.7–4.0)
MCH: 32.1 pg (ref 26.0–34.0)
MCHC: 32.8 g/dL (ref 30.0–36.0)
MCV: 98.1 fL (ref 80.0–100.0)
Monocytes Absolute: 0.7 K/uL (ref 0.1–1.0)
Monocytes Relative: 11 %
Neutro Abs: 3.7 K/uL (ref 1.7–7.7)
Neutrophils Relative %: 59 %
Platelet Count: 180 K/uL (ref 150–400)
RBC: 4.11 MIL/uL (ref 3.87–5.11)
RDW: 12.6 % (ref 11.5–15.5)
WBC Count: 6.2 K/uL (ref 4.0–10.5)
nRBC: 0 % (ref 0.0–0.2)

## 2023-10-15 LAB — CMP (CANCER CENTER ONLY)
ALT: 17 U/L (ref 0–44)
AST: 22 U/L (ref 15–41)
Albumin: 4.5 g/dL (ref 3.5–5.0)
Alkaline Phosphatase: 90 U/L (ref 38–126)
Anion gap: 14 (ref 5–15)
BUN: 34 mg/dL — ABNORMAL HIGH (ref 8–23)
CO2: 23 mmol/L (ref 22–32)
Calcium: 9.4 mg/dL (ref 8.9–10.3)
Chloride: 101 mmol/L (ref 98–111)
Creatinine: 1.08 mg/dL — ABNORMAL HIGH (ref 0.44–1.00)
GFR, Estimated: 54 mL/min — ABNORMAL LOW (ref >60)
Glucose, Bld: 100 mg/dL — ABNORMAL HIGH (ref 70–99)
Potassium: 4.3 mmol/L (ref 3.5–5.1)
Sodium: 138 mmol/L (ref 135–145)
Total Bilirubin: 0.6 mg/dL (ref <1.2)
Total Protein: 7.3 g/dL (ref 6.5–8.1)

## 2023-10-15 NOTE — Progress Notes (Signed)
Hematology and Oncology Follow Up Visit  MALYNDA LUMBRERAS [id number] 02/11/1946 77 y.o. 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 Redstone is here today follow-up.  This is of recently have seen her.  It was a lot of fun talking to her.  She has been followed by Maralyn Sago..  She is doing well on the Eliquis.  The big news is that she is going to have hip surgery for the left hip in January.  This will be on January 14 at [facility].  She is on Eliquis 5 mg p.o. twice daily.  I told her to stay on the Eliquis for up to 2 days before her procedure.  I would then restart the Eliquis the day after procedure.  She has had no problems with bleeding.  She has had no issues with nausea or vomiting.  She has had no cough or shortness of breath.  Back in July 16, she did have a CT of the chest.  This showed improvement subpleural she is doing fairly well but notes persistent pack pain that radiates down the left leg at times. She has an appointment with a new orthopedist to discuss consult Possible hip replacement with her history of osteoporosis. She is hoping this is an option for her.  Patient with a left lower lobe measuring 8 x 5 mm.  She has had no mammogram for couple years.  She bodies have a mammogram done.  She has had no leg swelling.  There is been no leg pain.  She has had no issues with fever.  Of note, before her last thromboembolic event, she had COVID.  Currently, I would have to say that her performance status is probably ECOG 1.    Medications:  Allergies as of 10/15/2023       Reactions   Tramadol Nausea And Vomiting        Medication List        Accurate as of October 15, 2023 12:52 PM. If you  have any questions, ask your nurse or doctor.          ALPRAZolam 0.5 MG tablet Commonly known as: XANAX Take 0.5 mg by mouth 3 (three) times daily as needed for anxiety.   baclofen 10 MG tablet Commonly known as: LIORESAL TAKE 1/2 BY MOUTH 3 TIMES DAILY AS NEEDED FOR MUSCLE SPASMS   calcium-vitamin D 500-5 MG-MCG tablet Commonly known as: OSCAL WITH D Take 1 tablet by mouth.   cyanocobalamin 500 MCG tablet Commonly known as: VITAMIN B12 Take 500 mcg by mouth daily.   Eliquis 5 MG Tabs tablet Generic drug: apixaban Take 5 mg by mouth 2 (two) times daily.   HYDROcodone-acetaminophen 7.5-325 MG tablet Commonly known as: NORCO Take 1 tablet by mouth every 8 (eight) hours as needed for moderate pain (cough).   rizatriptan 10 MG disintegrating tablet Commonly known as: MAXALT-MLT Take 10 mg by mouth See admin instructions. Take 1 tablet (10 mg) by mouth as needed for migraines, may repeat in 2 hours if needed   sertraline 100 MG tablet Commonly known as: ZOLOFT Take 50 mg by mouth daily.   triamcinolone ointment 0.1 % Commonly known as: KENALOG Apply 1 application  topically 2 (two) times daily. To affected areas   urea 10 % cream Commonly known as: CARMOL Apply topically as needed.   valACYclovir 500 MG tablet Commonly known as: VALTREX Take 500 mg by mouth 2 (two) times daily.        Allergies:  Allergies  Allergen Reactions   Tramadol Nausea And Vomiting    Past Medical History, Surgical history, Social history, and Family History were reviewed and updated.  Review of Systems: Review of Systems  Constitutional: Negative.   HENT: Negative.    Eyes: Negative.   Respiratory: Negative.    Cardiovascular:  Positive for chest pain.  Gastrointestinal: Negative.   Genitourinary: Negative.   Musculoskeletal: Negative.   Skin: Negative.   Neurological: Negative.   Endo/Heme/Allergies: Negative.   Psychiatric/Behavioral: Negative.       Physical  Exam:  height is 5' (1.524 m) and weight is 162 lb (73.5 kg). Her oral temperature is 98.2 F (36.8 C). Her blood pressure is 136/59 (abnormal) and her pulse is 96. Her respiration is 18 and oxygen saturation is 96%.   Wt Readings from Last 3 Encounters:  10/15/23 162 lb (73.5 kg)  06/17/23 159 lb 12.8 oz (72.5 kg)  04/21/23 157 lb (71.2 kg)    Physical Exam Vitals reviewed.  HENT:     Head: Normocephalic and atraumatic.  Eyes:     Pupils: Pupils are equal, round, and reactive to light.  Cardiovascular:     Rate and Rhythm: Normal rate and regular rhythm.     Heart sounds: Normal heart sounds.  Pulmonary:     Effort: Pulmonary effort is normal.     Breath sounds: Normal breath sounds.  Abdominal:     General: Bowel sounds are normal.     Palpations: Abdomen is soft.  Musculoskeletal:        General: No tenderness or deformity. Normal range of motion.     Cervical back: Normal range of motion.  Lymphadenopathy:     Cervical: No cervical adenopathy.  Skin:    General: Skin is warm and dry.     Findings: No erythema or rash.  Neurological:     Mental Status: She is alert and oriented to person, place, and time.  Psychiatric:        Behavior: Behavior normal.        Thought Content: Thought content normal.        Judgment: Judgment normal.     Lab Results  Component Value Date   WBC 6.2 10/15/2023   HGB 13.2 10/15/2023   HCT 40.3 10/15/2023   MCV 98.1 10/15/2023   PLT 180 10/15/2023   No results found for: "FERRITIN", "IRON", "TIBC", "UIBC", "IRONPCTSAT" Lab Results  Component Value Date   RBC 4.11 10/15/2023   No results found for: "KPAFRELGTCHN", "LAMBDASER", "KAPLAMBRATIO" No results found for: "IGGSERUM", "IGA", "IGMSERUM" Lab Results  Component Value Date   ALBUMINELP 4.1 09/27/2021   MSPIKE Not Observed 09/27/2021     Chemistry      Component Value Date/Time   NA 138 10/15/2023 1207   NA 144 09/27/2021 1022   K 4.3 10/15/2023 1207   CL 101  10/15/2023 1207   CO2 23 10/15/2023 1207   BUN 34 (H) 10/15/2023 1207   BUN 28 (H) 09/27/2021 1022   CREATININE 1.08 (H) 10/15/2023 1207      Component Value Date/Time   CALCIUM 9.4 10/15/2023 1207   ALKPHOS 90 10/15/2023 1207   AST 22 10/15/2023 1207  ALT 17 10/15/2023 1207   BILITOT 0.6 10/15/2023 1207       Impression and Plan:  Ms. Alon is a very pleasant 77 yo caucasian female with history of right lower extremity DVT and bilateral PE's in 2022 with Covid. 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   Again, she does not have surgery for her left hip in January.  I would keep her on full dose Eliquis until we see her back.  I will plan to see her back in late February.  By then, she should be able to make it into the office.   Josph Macho, MD 11/13/202412:52 PM

## 2023-11-03 DIAGNOSIS — Z471 Aftercare following joint replacement surgery: Secondary | ICD-10-CM | POA: Diagnosis not present

## 2023-11-03 DIAGNOSIS — Z969 Presence of functional implant, unspecified: Secondary | ICD-10-CM | POA: Diagnosis not present

## 2023-11-03 DIAGNOSIS — Z96642 Presence of left artificial hip joint: Secondary | ICD-10-CM | POA: Diagnosis not present

## 2023-11-03 DIAGNOSIS — M25552 Pain in left hip: Secondary | ICD-10-CM | POA: Diagnosis not present

## 2023-11-03 DIAGNOSIS — M1612 Unilateral primary osteoarthritis, left hip: Secondary | ICD-10-CM | POA: Diagnosis not present

## 2023-11-04 DIAGNOSIS — E66811 Obesity, class 1: Secondary | ICD-10-CM | POA: Diagnosis not present

## 2023-11-04 DIAGNOSIS — L821 Other seborrheic keratosis: Secondary | ICD-10-CM | POA: Diagnosis not present

## 2023-11-04 DIAGNOSIS — R635 Abnormal weight gain: Secondary | ICD-10-CM | POA: Diagnosis not present

## 2023-11-04 DIAGNOSIS — L814 Other melanin hyperpigmentation: Secondary | ICD-10-CM | POA: Diagnosis not present

## 2023-11-11 DIAGNOSIS — M5416 Radiculopathy, lumbar region: Secondary | ICD-10-CM | POA: Diagnosis not present

## 2023-11-11 DIAGNOSIS — Z79899 Other long term (current) drug therapy: Secondary | ICD-10-CM | POA: Diagnosis not present

## 2023-11-11 DIAGNOSIS — Z5181 Encounter for therapeutic drug level monitoring: Secondary | ICD-10-CM | POA: Diagnosis not present

## 2023-11-11 DIAGNOSIS — M810 Age-related osteoporosis without current pathological fracture: Secondary | ICD-10-CM | POA: Diagnosis not present

## 2023-11-11 DIAGNOSIS — Z8781 Personal history of (healed) traumatic fracture: Secondary | ICD-10-CM | POA: Diagnosis not present

## 2023-11-20 DIAGNOSIS — J208 Acute bronchitis due to other specified organisms: Secondary | ICD-10-CM | POA: Diagnosis not present

## 2023-11-20 DIAGNOSIS — B9689 Other specified bacterial agents as the cause of diseases classified elsewhere: Secondary | ICD-10-CM | POA: Diagnosis not present

## 2023-12-09 DIAGNOSIS — Z7901 Long term (current) use of anticoagulants: Secondary | ICD-10-CM | POA: Diagnosis not present

## 2023-12-09 DIAGNOSIS — R000 Tachycardia, unspecified: Secondary | ICD-10-CM | POA: Diagnosis not present

## 2023-12-09 DIAGNOSIS — Z8781 Personal history of (healed) traumatic fracture: Secondary | ICD-10-CM | POA: Diagnosis not present

## 2023-12-09 DIAGNOSIS — M1612 Unilateral primary osteoarthritis, left hip: Secondary | ICD-10-CM | POA: Diagnosis not present

## 2023-12-09 DIAGNOSIS — Z8616 Personal history of COVID-19: Secondary | ICD-10-CM | POA: Diagnosis not present

## 2023-12-09 DIAGNOSIS — Z86718 Personal history of other venous thrombosis and embolism: Secondary | ICD-10-CM | POA: Diagnosis not present

## 2023-12-09 DIAGNOSIS — Z86711 Personal history of pulmonary embolism: Secondary | ICD-10-CM | POA: Diagnosis not present

## 2023-12-09 DIAGNOSIS — F418 Other specified anxiety disorders: Secondary | ICD-10-CM | POA: Diagnosis not present

## 2023-12-09 DIAGNOSIS — Z01818 Encounter for other preprocedural examination: Secondary | ICD-10-CM | POA: Diagnosis not present

## 2023-12-09 DIAGNOSIS — Z87891 Personal history of nicotine dependence: Secondary | ICD-10-CM | POA: Diagnosis not present

## 2023-12-09 DIAGNOSIS — I452 Bifascicular block: Secondary | ICD-10-CM | POA: Diagnosis not present

## 2023-12-10 DIAGNOSIS — R262 Difficulty in walking, not elsewhere classified: Secondary | ICD-10-CM | POA: Diagnosis not present

## 2023-12-10 DIAGNOSIS — Z7409 Other reduced mobility: Secondary | ICD-10-CM | POA: Diagnosis not present

## 2023-12-10 DIAGNOSIS — M6281 Muscle weakness (generalized): Secondary | ICD-10-CM | POA: Diagnosis not present

## 2023-12-11 DIAGNOSIS — G8929 Other chronic pain: Secondary | ICD-10-CM | POA: Diagnosis not present

## 2023-12-11 DIAGNOSIS — M1612 Unilateral primary osteoarthritis, left hip: Secondary | ICD-10-CM | POA: Diagnosis not present

## 2023-12-11 DIAGNOSIS — R000 Tachycardia, unspecified: Secondary | ICD-10-CM | POA: Diagnosis not present

## 2023-12-11 DIAGNOSIS — I452 Bifascicular block: Secondary | ICD-10-CM | POA: Diagnosis not present

## 2023-12-11 DIAGNOSIS — M25552 Pain in left hip: Secondary | ICD-10-CM | POA: Diagnosis not present

## 2023-12-11 DIAGNOSIS — M533 Sacrococcygeal disorders, not elsewhere classified: Secondary | ICD-10-CM | POA: Diagnosis not present

## 2023-12-16 DIAGNOSIS — Z86711 Personal history of pulmonary embolism: Secondary | ICD-10-CM | POA: Diagnosis not present

## 2023-12-16 DIAGNOSIS — Z7901 Long term (current) use of anticoagulants: Secondary | ICD-10-CM | POA: Diagnosis not present

## 2023-12-16 DIAGNOSIS — Z87891 Personal history of nicotine dependence: Secondary | ICD-10-CM | POA: Diagnosis not present

## 2023-12-16 DIAGNOSIS — M810 Age-related osteoporosis without current pathological fracture: Secondary | ICD-10-CM | POA: Diagnosis not present

## 2023-12-16 DIAGNOSIS — G8918 Other acute postprocedural pain: Secondary | ICD-10-CM | POA: Diagnosis not present

## 2023-12-16 DIAGNOSIS — Z885 Allergy status to narcotic agent status: Secondary | ICD-10-CM | POA: Diagnosis not present

## 2023-12-16 DIAGNOSIS — Z79899 Other long term (current) drug therapy: Secondary | ICD-10-CM | POA: Diagnosis not present

## 2023-12-16 DIAGNOSIS — Z96642 Presence of left artificial hip joint: Secondary | ICD-10-CM | POA: Diagnosis not present

## 2023-12-16 DIAGNOSIS — Z90710 Acquired absence of both cervix and uterus: Secondary | ICD-10-CM | POA: Diagnosis not present

## 2023-12-16 DIAGNOSIS — F419 Anxiety disorder, unspecified: Secondary | ICD-10-CM | POA: Diagnosis not present

## 2023-12-16 DIAGNOSIS — Z471 Aftercare following joint replacement surgery: Secondary | ICD-10-CM | POA: Diagnosis not present

## 2023-12-16 DIAGNOSIS — F32A Depression, unspecified: Secondary | ICD-10-CM | POA: Diagnosis not present

## 2023-12-16 DIAGNOSIS — M1612 Unilateral primary osteoarthritis, left hip: Secondary | ICD-10-CM | POA: Diagnosis not present

## 2023-12-16 DIAGNOSIS — G8929 Other chronic pain: Secondary | ICD-10-CM | POA: Diagnosis not present

## 2023-12-16 DIAGNOSIS — Z86718 Personal history of other venous thrombosis and embolism: Secondary | ICD-10-CM | POA: Diagnosis not present

## 2023-12-16 DIAGNOSIS — Z4789 Encounter for other orthopedic aftercare: Secondary | ICD-10-CM | POA: Diagnosis not present

## 2023-12-29 DIAGNOSIS — Z96649 Presence of unspecified artificial hip joint: Secondary | ICD-10-CM | POA: Diagnosis not present

## 2024-01-01 DIAGNOSIS — Z7409 Other reduced mobility: Secondary | ICD-10-CM | POA: Diagnosis not present

## 2024-01-01 DIAGNOSIS — M6281 Muscle weakness (generalized): Secondary | ICD-10-CM | POA: Diagnosis not present

## 2024-01-01 DIAGNOSIS — R262 Difficulty in walking, not elsewhere classified: Secondary | ICD-10-CM | POA: Diagnosis not present

## 2024-01-01 DIAGNOSIS — Z96649 Presence of unspecified artificial hip joint: Secondary | ICD-10-CM | POA: Diagnosis not present

## 2024-01-02 DIAGNOSIS — Z96642 Presence of left artificial hip joint: Secondary | ICD-10-CM | POA: Diagnosis not present

## 2024-01-02 DIAGNOSIS — Z23 Encounter for immunization: Secondary | ICD-10-CM | POA: Diagnosis not present

## 2024-01-02 DIAGNOSIS — R635 Abnormal weight gain: Secondary | ICD-10-CM | POA: Diagnosis not present

## 2024-01-02 DIAGNOSIS — M1612 Unilateral primary osteoarthritis, left hip: Secondary | ICD-10-CM | POA: Diagnosis not present

## 2024-01-06 DIAGNOSIS — Z96649 Presence of unspecified artificial hip joint: Secondary | ICD-10-CM | POA: Diagnosis not present

## 2024-01-06 DIAGNOSIS — M6281 Muscle weakness (generalized): Secondary | ICD-10-CM | POA: Diagnosis not present

## 2024-01-06 DIAGNOSIS — R262 Difficulty in walking, not elsewhere classified: Secondary | ICD-10-CM | POA: Diagnosis not present

## 2024-01-06 DIAGNOSIS — Z7409 Other reduced mobility: Secondary | ICD-10-CM | POA: Diagnosis not present

## 2024-01-12 DIAGNOSIS — M810 Age-related osteoporosis without current pathological fracture: Secondary | ICD-10-CM | POA: Diagnosis not present

## 2024-01-14 DIAGNOSIS — R262 Difficulty in walking, not elsewhere classified: Secondary | ICD-10-CM | POA: Diagnosis not present

## 2024-01-14 DIAGNOSIS — Z7409 Other reduced mobility: Secondary | ICD-10-CM | POA: Diagnosis not present

## 2024-01-14 DIAGNOSIS — M6281 Muscle weakness (generalized): Secondary | ICD-10-CM | POA: Diagnosis not present

## 2024-01-14 DIAGNOSIS — Z96649 Presence of unspecified artificial hip joint: Secondary | ICD-10-CM | POA: Diagnosis not present

## 2024-01-18 DIAGNOSIS — Z96642 Presence of left artificial hip joint: Secondary | ICD-10-CM | POA: Diagnosis not present

## 2024-01-20 DIAGNOSIS — M6281 Muscle weakness (generalized): Secondary | ICD-10-CM | POA: Diagnosis not present

## 2024-01-20 DIAGNOSIS — Z7409 Other reduced mobility: Secondary | ICD-10-CM | POA: Diagnosis not present

## 2024-01-20 DIAGNOSIS — Z96649 Presence of unspecified artificial hip joint: Secondary | ICD-10-CM | POA: Diagnosis not present

## 2024-01-20 DIAGNOSIS — R262 Difficulty in walking, not elsewhere classified: Secondary | ICD-10-CM | POA: Diagnosis not present

## 2024-01-26 DIAGNOSIS — Z96649 Presence of unspecified artificial hip joint: Secondary | ICD-10-CM | POA: Diagnosis not present

## 2024-01-26 DIAGNOSIS — Z96642 Presence of left artificial hip joint: Secondary | ICD-10-CM | POA: Diagnosis not present

## 2024-01-26 DIAGNOSIS — Z471 Aftercare following joint replacement surgery: Secondary | ICD-10-CM | POA: Diagnosis not present

## 2024-02-03 DIAGNOSIS — Z96649 Presence of unspecified artificial hip joint: Secondary | ICD-10-CM | POA: Diagnosis not present

## 2024-02-03 DIAGNOSIS — R262 Difficulty in walking, not elsewhere classified: Secondary | ICD-10-CM | POA: Diagnosis not present

## 2024-02-03 DIAGNOSIS — Z7409 Other reduced mobility: Secondary | ICD-10-CM | POA: Diagnosis not present

## 2024-02-03 DIAGNOSIS — M6281 Muscle weakness (generalized): Secondary | ICD-10-CM | POA: Diagnosis not present

## 2024-02-06 DIAGNOSIS — Z96649 Presence of unspecified artificial hip joint: Secondary | ICD-10-CM | POA: Diagnosis not present

## 2024-02-06 DIAGNOSIS — R262 Difficulty in walking, not elsewhere classified: Secondary | ICD-10-CM | POA: Diagnosis not present

## 2024-02-06 DIAGNOSIS — Z7409 Other reduced mobility: Secondary | ICD-10-CM | POA: Diagnosis not present

## 2024-02-06 DIAGNOSIS — M6281 Muscle weakness (generalized): Secondary | ICD-10-CM | POA: Diagnosis not present

## 2024-02-09 DIAGNOSIS — Z96649 Presence of unspecified artificial hip joint: Secondary | ICD-10-CM | POA: Diagnosis not present

## 2024-02-09 DIAGNOSIS — Z7409 Other reduced mobility: Secondary | ICD-10-CM | POA: Diagnosis not present

## 2024-02-09 DIAGNOSIS — M6281 Muscle weakness (generalized): Secondary | ICD-10-CM | POA: Diagnosis not present

## 2024-02-09 DIAGNOSIS — R262 Difficulty in walking, not elsewhere classified: Secondary | ICD-10-CM | POA: Diagnosis not present

## 2024-02-09 DIAGNOSIS — M810 Age-related osteoporosis without current pathological fracture: Secondary | ICD-10-CM | POA: Diagnosis not present

## 2024-02-11 ENCOUNTER — Other Ambulatory Visit: Payer: Self-pay | Admitting: *Deleted

## 2024-02-11 DIAGNOSIS — Z96649 Presence of unspecified artificial hip joint: Secondary | ICD-10-CM | POA: Diagnosis not present

## 2024-02-11 DIAGNOSIS — R262 Difficulty in walking, not elsewhere classified: Secondary | ICD-10-CM | POA: Diagnosis not present

## 2024-02-11 DIAGNOSIS — Z86718 Personal history of other venous thrombosis and embolism: Secondary | ICD-10-CM

## 2024-02-11 DIAGNOSIS — M6281 Muscle weakness (generalized): Secondary | ICD-10-CM | POA: Diagnosis not present

## 2024-02-11 DIAGNOSIS — Z7409 Other reduced mobility: Secondary | ICD-10-CM | POA: Diagnosis not present

## 2024-02-12 ENCOUNTER — Other Ambulatory Visit: Payer: Self-pay

## 2024-02-12 ENCOUNTER — Inpatient Hospital Stay: Payer: PPO | Attending: Hematology & Oncology

## 2024-02-12 ENCOUNTER — Encounter: Payer: Self-pay | Admitting: Hematology & Oncology

## 2024-02-12 ENCOUNTER — Inpatient Hospital Stay: Payer: PPO | Admitting: Hematology & Oncology

## 2024-02-12 VITALS — BP 114/57 | HR 72 | Temp 98.5°F | Resp 18 | Ht 60.0 in | Wt 160.0 lb

## 2024-02-12 DIAGNOSIS — Z7901 Long term (current) use of anticoagulants: Secondary | ICD-10-CM | POA: Insufficient documentation

## 2024-02-12 DIAGNOSIS — I2782 Chronic pulmonary embolism: Secondary | ICD-10-CM | POA: Diagnosis not present

## 2024-02-12 DIAGNOSIS — R0789 Other chest pain: Secondary | ICD-10-CM

## 2024-02-12 DIAGNOSIS — Z86718 Personal history of other venous thrombosis and embolism: Secondary | ICD-10-CM | POA: Diagnosis not present

## 2024-02-12 LAB — CBC WITH DIFFERENTIAL (CANCER CENTER ONLY)
Abs Immature Granulocytes: 0.01 K/uL (ref 0.00–0.07)
Basophils Absolute: 0 K/uL (ref 0.0–0.1)
Basophils Relative: 1 %
Eosinophils Absolute: 0 K/uL (ref 0.0–0.5)
Eosinophils Relative: 0 %
HCT: 38 % (ref 36.0–46.0)
Hemoglobin: 12 g/dL (ref 12.0–15.0)
Immature Granulocytes: 0 %
Lymphocytes Relative: 28 %
Lymphs Abs: 1.4 K/uL (ref 0.7–4.0)
MCH: 31.7 pg (ref 26.0–34.0)
MCHC: 31.6 g/dL (ref 30.0–36.0)
MCV: 100.5 fL — ABNORMAL HIGH (ref 80.0–100.0)
Monocytes Absolute: 0.6 K/uL (ref 0.1–1.0)
Monocytes Relative: 11 %
Neutro Abs: 3.1 K/uL (ref 1.7–7.7)
Neutrophils Relative %: 60 %
Platelet Count: 199 K/uL (ref 150–400)
RBC: 3.78 MIL/uL — ABNORMAL LOW (ref 3.87–5.11)
RDW: 13.6 % (ref 11.5–15.5)
WBC Count: 5.1 K/uL (ref 4.0–10.5)
nRBC: 0 % (ref 0.0–0.2)

## 2024-02-12 LAB — CMP (CANCER CENTER ONLY)
ALT: 14 U/L (ref 0–44)
AST: 19 U/L (ref 15–41)
Albumin: 4.6 g/dL (ref 3.5–5.0)
Alkaline Phosphatase: 160 U/L — ABNORMAL HIGH (ref 38–126)
Anion gap: 10 (ref 5–15)
BUN: 19 mg/dL (ref 8–23)
CO2: 29 mmol/L (ref 22–32)
Calcium: 10.1 mg/dL (ref 8.9–10.3)
Chloride: 102 mmol/L (ref 98–111)
Creatinine: 0.9 mg/dL (ref 0.44–1.00)
GFR, Estimated: >60 mL/min
Glucose, Bld: 126 mg/dL — ABNORMAL HIGH (ref 70–99)
Potassium: 4.9 mmol/L (ref 3.5–5.1)
Sodium: 141 mmol/L (ref 135–145)
Total Bilirubin: 0.4 mg/dL (ref 0.0–1.2)
Total Protein: 7.2 g/dL (ref 6.5–8.1)

## 2024-02-12 MED ORDER — APIXABAN 2.5 MG PO TABS: 2.5000 mg | ORAL_TABLET | Freq: Two times a day (BID) | ORAL | 12 refills | Status: AC

## 2024-02-12 NOTE — Progress Notes (Signed)
 Hematology and Oncology Follow Up Visit  Hayley Walters [id number] 1946/08/19 78 y.o. 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 twice daily on 02/12/2024   Interim History:  Hayley Walters is here today follow-up.  She got through her hip surgery without any difficulties.  She had hip surgery for the left hip.  This was done in January.  She really is pleased with how well she is done.  She had no problems with bleeding.  She had no problems with nausea or vomiting.  She had no change in bowel or bladder habits.  At this point, I think we can now get her on a maintenance dose of Eliquis.  I think this would be reasonable.  She has been on the full dose Eliquis for over a year.  I told her that since she has had a recurrence of her thromboembolic disease, she will need to be on blood thinner probably for life.  She has had no chest wall pain.  There has been no headache.  She has had no rashes.  Overall, I would say that her performance status is probably ECOG 1.     Medications:  Allergies as of 02/12/2024       Reactions   Tramadol Nausea And Vomiting        Medication List        Accurate as of February 12, 2024  1:29 PM. If you have any questions, ask your nurse or doctor.          STOP taking these medications    baclofen 10 MG tablet Commonly known as: LIORESAL Stopped by: Josph Macho   cyanocobalamin 500 MCG tablet Commonly known as: VITAMIN B12 Stopped by: Josph Macho   triamcinolone ointment 0.1 % Commonly known as: KENALOG Stopped by: Josph Macho       TAKE these medications    ALPRAZolam 0.5 MG tablet Commonly known as: XANAX Take 0.5 mg by mouth 3 (three) times daily  as needed for anxiety.   ascorbic acid 500 MG tablet Commonly known as: VITAMIN C Take 1 tablet by mouth daily.   calcium-vitamin D 500-5 MG-MCG tablet Commonly known as: OSCAL WITH D Take 1 tablet by mouth.   cholecalciferol 25 MCG (1000 UNIT) tablet Commonly known as: VITAMIN D3 Take 1,000 Units by mouth daily.   Eliquis 5 MG Tabs tablet Generic drug: apixaban Take 5 mg by mouth 2 (two) times daily.   HYDROcodone-acetaminophen 5-325 MG tablet Commonly known as: NORCO/VICODIN Take 1 tablet by mouth every 6 (six) hours as needed. What changed: Another medication with the same name was removed. Continue taking this medication, and follow the directions you see here. Changed by: Josph Macho   Retin-A 0.05 % cream Generic drug: tretinoin Apply 1 application  topically at bedtime.   rizatriptan 10 MG disintegrating tablet Commonly known as: MAXALT-MLT Take 10 mg by mouth See admin instructions. Take 1 tablet (10 mg) by mouth as needed for migraines, may repeat in 2 hours if needed   sertraline 100 MG tablet Commonly known as: ZOLOFT Take 50 mg by mouth daily.   urea 10 % cream Commonly known as: CARMOL Apply topically  as needed.   valACYclovir 1000 MG tablet Commonly known as: VALTREX Take 1,000 mg by mouth 3 (three) times daily. What changed: Another medication with the same name was removed. Continue taking this medication, and follow the directions you see here. Changed by: Josph Macho   VITAMIN B COMPLEX PO Take 1 tablet by mouth daily.        Allergies:  Allergies  Allergen Reactions   Tramadol Nausea And Vomiting    Past Medical History, Surgical history, Social history, and Family History were reviewed and updated.  Review of Systems: Review of Systems  Constitutional: Negative.   HENT: Negative.    Eyes: Negative.   Respiratory: Negative.    Cardiovascular:  Positive for chest pain.  Gastrointestinal: Negative.   Genitourinary:  Negative.   Musculoskeletal: Negative.   Skin: Negative.   Neurological: Negative.   Endo/Heme/Allergies: Negative.   Psychiatric/Behavioral: Negative.       Physical Exam:  height is 5' (1.524 m) and weight is 160 lb (72.6 kg). Her oral temperature is 98.5 F (36.9 C). Her blood pressure is 114/57 (abnormal) and her pulse is 72. Her respiration is 18 and oxygen saturation is 100%.   Wt Readings from Last 3 Encounters:  02/12/24 160 lb (72.6 kg)  10/15/23 162 lb (73.5 kg)  06/17/23 159 lb 12.8 oz (72.5 kg)    Physical Exam Vitals reviewed.  HENT:     Head: Normocephalic and atraumatic.  Eyes:     Pupils: Pupils are equal, round, and reactive to light.  Cardiovascular:     Rate and Rhythm: Normal rate and regular rhythm.     Heart sounds: Normal heart sounds.  Pulmonary:     Effort: Pulmonary effort is normal.     Breath sounds: Normal breath sounds.  Abdominal:     General: Bowel sounds are normal.     Palpations: Abdomen is soft.  Musculoskeletal:        General: No tenderness or deformity. Normal range of motion.     Cervical back: Normal range of motion.  Lymphadenopathy:     Cervical: No cervical adenopathy.  Skin:    General: Skin is warm and dry.     Findings: No erythema or rash.  Neurological:     Mental Status: She is alert and oriented to person, place, and time.  Psychiatric:        Behavior: Behavior normal.        Thought Content: Thought content normal.        Judgment: Judgment normal.     Lab Results  Component Value Date   WBC 5.1 02/12/2024   HGB 12.0 02/12/2024   HCT 38.0 02/12/2024   MCV 100.5 (H) 02/12/2024   PLT 199 02/12/2024   No results found for: "FERRITIN", "IRON", "TIBC", "UIBC", "IRONPCTSAT" Lab Results  Component Value Date   RBC 3.78 (L) 02/12/2024   No results found for: "KPAFRELGTCHN", "LAMBDASER", "KAPLAMBRATIO" No results found for: "IGGSERUM", "IGA", "IGMSERUM" Lab Results  Component Value Date   ALBUMINELP 4.1  09/27/2021   MSPIKE Not Observed 09/27/2021     Chemistry      Component Value Date/Time   NA 141 02/12/2024 1152   NA 144 09/27/2021 1022   K 4.9 02/12/2024 1152   CL 102 02/12/2024 1152   CO2 29 02/12/2024 1152   BUN 19 02/12/2024 1152   BUN 28 (H) 09/27/2021 1022   CREATININE 0.90 02/12/2024 1152      Component Value Date/Time  CALCIUM 10.1 02/12/2024 1152   ALKPHOS 160 (H) 02/12/2024 1152   AST 19 02/12/2024 1152   ALT 14 02/12/2024 1152   BILITOT 0.4 02/12/2024 1152       Impression and Plan:  Ms. Hoelzel is a very pleasant 78 yo caucasian female with history of right lower extremity DVT and bilateral PE's in 2022 with Covid. 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   I still believe that she is going to require lifelong anticoagulation.  Again we can probably go with maintenance therapy for her.  I would like to get her back in about 6 months now.  I think this would be very reasonable.  I am glad that she got through her hip surgery.  She is feeling a whole lot better.  Josph Macho, MD 3/13/20251:29 PM

## 2024-02-17 DIAGNOSIS — R262 Difficulty in walking, not elsewhere classified: Secondary | ICD-10-CM | POA: Diagnosis not present

## 2024-02-17 DIAGNOSIS — Z96649 Presence of unspecified artificial hip joint: Secondary | ICD-10-CM | POA: Diagnosis not present

## 2024-02-17 DIAGNOSIS — Z7409 Other reduced mobility: Secondary | ICD-10-CM | POA: Diagnosis not present

## 2024-02-17 DIAGNOSIS — M6281 Muscle weakness (generalized): Secondary | ICD-10-CM | POA: Diagnosis not present

## 2024-02-19 DIAGNOSIS — R262 Difficulty in walking, not elsewhere classified: Secondary | ICD-10-CM | POA: Diagnosis not present

## 2024-02-19 DIAGNOSIS — M6281 Muscle weakness (generalized): Secondary | ICD-10-CM | POA: Diagnosis not present

## 2024-02-19 DIAGNOSIS — Z7409 Other reduced mobility: Secondary | ICD-10-CM | POA: Diagnosis not present

## 2024-02-19 DIAGNOSIS — Z96649 Presence of unspecified artificial hip joint: Secondary | ICD-10-CM | POA: Diagnosis not present

## 2024-02-24 DIAGNOSIS — Z96649 Presence of unspecified artificial hip joint: Secondary | ICD-10-CM | POA: Diagnosis not present

## 2024-02-24 DIAGNOSIS — Z7409 Other reduced mobility: Secondary | ICD-10-CM | POA: Diagnosis not present

## 2024-02-24 DIAGNOSIS — R262 Difficulty in walking, not elsewhere classified: Secondary | ICD-10-CM | POA: Diagnosis not present

## 2024-02-24 DIAGNOSIS — M6281 Muscle weakness (generalized): Secondary | ICD-10-CM | POA: Diagnosis not present

## 2024-02-26 DIAGNOSIS — M6281 Muscle weakness (generalized): Secondary | ICD-10-CM | POA: Diagnosis not present

## 2024-02-26 DIAGNOSIS — R262 Difficulty in walking, not elsewhere classified: Secondary | ICD-10-CM | POA: Diagnosis not present

## 2024-02-26 DIAGNOSIS — Z96649 Presence of unspecified artificial hip joint: Secondary | ICD-10-CM | POA: Diagnosis not present

## 2024-02-26 DIAGNOSIS — Z7409 Other reduced mobility: Secondary | ICD-10-CM | POA: Diagnosis not present

## 2024-03-02 DIAGNOSIS — Z96649 Presence of unspecified artificial hip joint: Secondary | ICD-10-CM | POA: Diagnosis not present

## 2024-03-02 DIAGNOSIS — Z7409 Other reduced mobility: Secondary | ICD-10-CM | POA: Diagnosis not present

## 2024-03-02 DIAGNOSIS — M6281 Muscle weakness (generalized): Secondary | ICD-10-CM | POA: Diagnosis not present

## 2024-03-02 DIAGNOSIS — R262 Difficulty in walking, not elsewhere classified: Secondary | ICD-10-CM | POA: Diagnosis not present

## 2024-03-04 DIAGNOSIS — M6281 Muscle weakness (generalized): Secondary | ICD-10-CM | POA: Diagnosis not present

## 2024-03-04 DIAGNOSIS — R262 Difficulty in walking, not elsewhere classified: Secondary | ICD-10-CM | POA: Diagnosis not present

## 2024-03-04 DIAGNOSIS — Z7409 Other reduced mobility: Secondary | ICD-10-CM | POA: Diagnosis not present

## 2024-03-04 DIAGNOSIS — Z96649 Presence of unspecified artificial hip joint: Secondary | ICD-10-CM | POA: Diagnosis not present

## 2024-03-09 DIAGNOSIS — M6281 Muscle weakness (generalized): Secondary | ICD-10-CM | POA: Diagnosis not present

## 2024-03-09 DIAGNOSIS — R262 Difficulty in walking, not elsewhere classified: Secondary | ICD-10-CM | POA: Diagnosis not present

## 2024-03-09 DIAGNOSIS — Z96649 Presence of unspecified artificial hip joint: Secondary | ICD-10-CM | POA: Diagnosis not present

## 2024-03-09 DIAGNOSIS — Z7409 Other reduced mobility: Secondary | ICD-10-CM | POA: Diagnosis not present

## 2024-03-09 DIAGNOSIS — M810 Age-related osteoporosis without current pathological fracture: Secondary | ICD-10-CM | POA: Diagnosis not present

## 2024-03-16 DIAGNOSIS — R262 Difficulty in walking, not elsewhere classified: Secondary | ICD-10-CM | POA: Diagnosis not present

## 2024-03-16 DIAGNOSIS — Z7409 Other reduced mobility: Secondary | ICD-10-CM | POA: Diagnosis not present

## 2024-03-16 DIAGNOSIS — M6281 Muscle weakness (generalized): Secondary | ICD-10-CM | POA: Diagnosis not present

## 2024-03-16 DIAGNOSIS — Z96649 Presence of unspecified artificial hip joint: Secondary | ICD-10-CM | POA: Diagnosis not present

## 2024-03-18 DIAGNOSIS — R262 Difficulty in walking, not elsewhere classified: Secondary | ICD-10-CM | POA: Diagnosis not present

## 2024-03-18 DIAGNOSIS — Z7409 Other reduced mobility: Secondary | ICD-10-CM | POA: Diagnosis not present

## 2024-03-18 DIAGNOSIS — Z96649 Presence of unspecified artificial hip joint: Secondary | ICD-10-CM | POA: Diagnosis not present

## 2024-03-18 DIAGNOSIS — M6281 Muscle weakness (generalized): Secondary | ICD-10-CM | POA: Diagnosis not present

## 2024-03-22 DIAGNOSIS — Z7409 Other reduced mobility: Secondary | ICD-10-CM | POA: Diagnosis not present

## 2024-03-22 DIAGNOSIS — M6281 Muscle weakness (generalized): Secondary | ICD-10-CM | POA: Diagnosis not present

## 2024-03-22 DIAGNOSIS — R262 Difficulty in walking, not elsewhere classified: Secondary | ICD-10-CM | POA: Diagnosis not present

## 2024-03-22 DIAGNOSIS — Z96649 Presence of unspecified artificial hip joint: Secondary | ICD-10-CM | POA: Diagnosis not present

## 2024-03-25 DIAGNOSIS — Z96649 Presence of unspecified artificial hip joint: Secondary | ICD-10-CM | POA: Diagnosis not present

## 2024-03-25 DIAGNOSIS — Z7409 Other reduced mobility: Secondary | ICD-10-CM | POA: Diagnosis not present

## 2024-03-25 DIAGNOSIS — M6281 Muscle weakness (generalized): Secondary | ICD-10-CM | POA: Diagnosis not present

## 2024-03-25 DIAGNOSIS — R262 Difficulty in walking, not elsewhere classified: Secondary | ICD-10-CM | POA: Diagnosis not present

## 2024-03-30 DIAGNOSIS — Z96649 Presence of unspecified artificial hip joint: Secondary | ICD-10-CM | POA: Diagnosis not present

## 2024-03-30 DIAGNOSIS — R262 Difficulty in walking, not elsewhere classified: Secondary | ICD-10-CM | POA: Diagnosis not present

## 2024-03-30 DIAGNOSIS — Z7409 Other reduced mobility: Secondary | ICD-10-CM | POA: Diagnosis not present

## 2024-04-01 DIAGNOSIS — Z7409 Other reduced mobility: Secondary | ICD-10-CM | POA: Diagnosis not present

## 2024-04-01 DIAGNOSIS — M6281 Muscle weakness (generalized): Secondary | ICD-10-CM | POA: Diagnosis not present

## 2024-04-01 DIAGNOSIS — Z96649 Presence of unspecified artificial hip joint: Secondary | ICD-10-CM | POA: Diagnosis not present

## 2024-04-01 DIAGNOSIS — R262 Difficulty in walking, not elsewhere classified: Secondary | ICD-10-CM | POA: Diagnosis not present

## 2024-04-08 DIAGNOSIS — R262 Difficulty in walking, not elsewhere classified: Secondary | ICD-10-CM | POA: Diagnosis not present

## 2024-04-08 DIAGNOSIS — Z7409 Other reduced mobility: Secondary | ICD-10-CM | POA: Diagnosis not present

## 2024-04-08 DIAGNOSIS — M6281 Muscle weakness (generalized): Secondary | ICD-10-CM | POA: Diagnosis not present

## 2024-04-08 DIAGNOSIS — Z96649 Presence of unspecified artificial hip joint: Secondary | ICD-10-CM | POA: Diagnosis not present

## 2024-04-09 DIAGNOSIS — M810 Age-related osteoporosis without current pathological fracture: Secondary | ICD-10-CM | POA: Diagnosis not present

## 2024-04-13 DIAGNOSIS — M6281 Muscle weakness (generalized): Secondary | ICD-10-CM | POA: Diagnosis not present

## 2024-04-13 DIAGNOSIS — R262 Difficulty in walking, not elsewhere classified: Secondary | ICD-10-CM | POA: Diagnosis not present

## 2024-04-13 DIAGNOSIS — Z96649 Presence of unspecified artificial hip joint: Secondary | ICD-10-CM | POA: Diagnosis not present

## 2024-04-13 DIAGNOSIS — Z7409 Other reduced mobility: Secondary | ICD-10-CM | POA: Diagnosis not present

## 2024-04-15 DIAGNOSIS — M6281 Muscle weakness (generalized): Secondary | ICD-10-CM | POA: Diagnosis not present

## 2024-04-15 DIAGNOSIS — R262 Difficulty in walking, not elsewhere classified: Secondary | ICD-10-CM | POA: Diagnosis not present

## 2024-04-15 DIAGNOSIS — Z96649 Presence of unspecified artificial hip joint: Secondary | ICD-10-CM | POA: Diagnosis not present

## 2024-04-15 DIAGNOSIS — Z7409 Other reduced mobility: Secondary | ICD-10-CM | POA: Diagnosis not present

## 2024-04-19 DIAGNOSIS — Z471 Aftercare following joint replacement surgery: Secondary | ICD-10-CM | POA: Diagnosis not present

## 2024-04-19 DIAGNOSIS — M25552 Pain in left hip: Secondary | ICD-10-CM | POA: Diagnosis not present

## 2024-04-19 DIAGNOSIS — Z96642 Presence of left artificial hip joint: Secondary | ICD-10-CM | POA: Diagnosis not present

## 2024-04-22 DIAGNOSIS — Z7409 Other reduced mobility: Secondary | ICD-10-CM | POA: Diagnosis not present

## 2024-04-22 DIAGNOSIS — M6281 Muscle weakness (generalized): Secondary | ICD-10-CM | POA: Diagnosis not present

## 2024-04-22 DIAGNOSIS — R262 Difficulty in walking, not elsewhere classified: Secondary | ICD-10-CM | POA: Diagnosis not present

## 2024-04-22 DIAGNOSIS — Z96649 Presence of unspecified artificial hip joint: Secondary | ICD-10-CM | POA: Diagnosis not present

## 2024-04-27 DIAGNOSIS — M6281 Muscle weakness (generalized): Secondary | ICD-10-CM | POA: Diagnosis not present

## 2024-04-27 DIAGNOSIS — Z96649 Presence of unspecified artificial hip joint: Secondary | ICD-10-CM | POA: Diagnosis not present

## 2024-04-27 DIAGNOSIS — R262 Difficulty in walking, not elsewhere classified: Secondary | ICD-10-CM | POA: Diagnosis not present

## 2024-04-27 DIAGNOSIS — Z7409 Other reduced mobility: Secondary | ICD-10-CM | POA: Diagnosis not present

## 2024-04-29 DIAGNOSIS — R262 Difficulty in walking, not elsewhere classified: Secondary | ICD-10-CM | POA: Diagnosis not present

## 2024-04-29 DIAGNOSIS — M6281 Muscle weakness (generalized): Secondary | ICD-10-CM | POA: Diagnosis not present

## 2024-04-29 DIAGNOSIS — Z7409 Other reduced mobility: Secondary | ICD-10-CM | POA: Diagnosis not present

## 2024-04-29 DIAGNOSIS — Z96649 Presence of unspecified artificial hip joint: Secondary | ICD-10-CM | POA: Diagnosis not present

## 2024-05-03 DIAGNOSIS — Z7409 Other reduced mobility: Secondary | ICD-10-CM | POA: Diagnosis not present

## 2024-05-03 DIAGNOSIS — R262 Difficulty in walking, not elsewhere classified: Secondary | ICD-10-CM | POA: Diagnosis not present

## 2024-05-03 DIAGNOSIS — Z96649 Presence of unspecified artificial hip joint: Secondary | ICD-10-CM | POA: Diagnosis not present

## 2024-05-03 DIAGNOSIS — M6281 Muscle weakness (generalized): Secondary | ICD-10-CM | POA: Diagnosis not present

## 2024-05-10 DIAGNOSIS — Z96649 Presence of unspecified artificial hip joint: Secondary | ICD-10-CM | POA: Diagnosis not present

## 2024-05-10 DIAGNOSIS — M6281 Muscle weakness (generalized): Secondary | ICD-10-CM | POA: Diagnosis not present

## 2024-05-10 DIAGNOSIS — Z7409 Other reduced mobility: Secondary | ICD-10-CM | POA: Diagnosis not present

## 2024-05-10 DIAGNOSIS — R262 Difficulty in walking, not elsewhere classified: Secondary | ICD-10-CM | POA: Diagnosis not present

## 2024-05-12 DIAGNOSIS — M810 Age-related osteoporosis without current pathological fracture: Secondary | ICD-10-CM | POA: Diagnosis not present

## 2024-05-13 DIAGNOSIS — Z96649 Presence of unspecified artificial hip joint: Secondary | ICD-10-CM | POA: Diagnosis not present

## 2024-05-13 DIAGNOSIS — M6281 Muscle weakness (generalized): Secondary | ICD-10-CM | POA: Diagnosis not present

## 2024-05-13 DIAGNOSIS — R262 Difficulty in walking, not elsewhere classified: Secondary | ICD-10-CM | POA: Diagnosis not present

## 2024-05-13 DIAGNOSIS — Z7409 Other reduced mobility: Secondary | ICD-10-CM | POA: Diagnosis not present

## 2024-05-17 DIAGNOSIS — Z96649 Presence of unspecified artificial hip joint: Secondary | ICD-10-CM | POA: Diagnosis not present

## 2024-05-17 DIAGNOSIS — Z7409 Other reduced mobility: Secondary | ICD-10-CM | POA: Diagnosis not present

## 2024-05-17 DIAGNOSIS — M6281 Muscle weakness (generalized): Secondary | ICD-10-CM | POA: Diagnosis not present

## 2024-05-17 DIAGNOSIS — R262 Difficulty in walking, not elsewhere classified: Secondary | ICD-10-CM | POA: Diagnosis not present

## 2024-05-18 DIAGNOSIS — M25562 Pain in left knee: Secondary | ICD-10-CM | POA: Diagnosis not present

## 2024-05-18 DIAGNOSIS — M1712 Unilateral primary osteoarthritis, left knee: Secondary | ICD-10-CM | POA: Diagnosis not present

## 2024-05-18 DIAGNOSIS — Z471 Aftercare following joint replacement surgery: Secondary | ICD-10-CM | POA: Diagnosis not present

## 2024-05-18 DIAGNOSIS — M47816 Spondylosis without myelopathy or radiculopathy, lumbar region: Secondary | ICD-10-CM | POA: Diagnosis not present

## 2024-05-18 DIAGNOSIS — M4316 Spondylolisthesis, lumbar region: Secondary | ICD-10-CM | POA: Diagnosis not present

## 2024-05-18 DIAGNOSIS — G8929 Other chronic pain: Secondary | ICD-10-CM | POA: Diagnosis not present

## 2024-05-18 DIAGNOSIS — M5416 Radiculopathy, lumbar region: Secondary | ICD-10-CM | POA: Diagnosis not present

## 2024-05-18 DIAGNOSIS — Z96642 Presence of left artificial hip joint: Secondary | ICD-10-CM | POA: Diagnosis not present

## 2024-05-18 DIAGNOSIS — M79652 Pain in left thigh: Secondary | ICD-10-CM | POA: Diagnosis not present

## 2024-06-01 DIAGNOSIS — Z96649 Presence of unspecified artificial hip joint: Secondary | ICD-10-CM | POA: Diagnosis not present

## 2024-06-01 DIAGNOSIS — M4726 Other spondylosis with radiculopathy, lumbar region: Secondary | ICD-10-CM | POA: Diagnosis not present

## 2024-06-01 DIAGNOSIS — M6281 Muscle weakness (generalized): Secondary | ICD-10-CM | POA: Diagnosis not present

## 2024-06-01 DIAGNOSIS — M5116 Intervertebral disc disorders with radiculopathy, lumbar region: Secondary | ICD-10-CM | POA: Diagnosis not present

## 2024-06-01 DIAGNOSIS — M4316 Spondylolisthesis, lumbar region: Secondary | ICD-10-CM | POA: Diagnosis not present

## 2024-06-01 DIAGNOSIS — M48061 Spinal stenosis, lumbar region without neurogenic claudication: Secondary | ICD-10-CM | POA: Diagnosis not present

## 2024-06-01 DIAGNOSIS — R262 Difficulty in walking, not elsewhere classified: Secondary | ICD-10-CM | POA: Diagnosis not present

## 2024-06-01 DIAGNOSIS — Z7409 Other reduced mobility: Secondary | ICD-10-CM | POA: Diagnosis not present

## 2024-06-07 DIAGNOSIS — Z96649 Presence of unspecified artificial hip joint: Secondary | ICD-10-CM | POA: Diagnosis not present

## 2024-06-07 DIAGNOSIS — R262 Difficulty in walking, not elsewhere classified: Secondary | ICD-10-CM | POA: Diagnosis not present

## 2024-06-07 DIAGNOSIS — Z7409 Other reduced mobility: Secondary | ICD-10-CM | POA: Diagnosis not present

## 2024-06-07 DIAGNOSIS — M6281 Muscle weakness (generalized): Secondary | ICD-10-CM | POA: Diagnosis not present

## 2024-06-10 DIAGNOSIS — M6281 Muscle weakness (generalized): Secondary | ICD-10-CM | POA: Diagnosis not present

## 2024-06-10 DIAGNOSIS — R262 Difficulty in walking, not elsewhere classified: Secondary | ICD-10-CM | POA: Diagnosis not present

## 2024-06-10 DIAGNOSIS — Z96649 Presence of unspecified artificial hip joint: Secondary | ICD-10-CM | POA: Diagnosis not present

## 2024-06-10 DIAGNOSIS — Z7409 Other reduced mobility: Secondary | ICD-10-CM | POA: Diagnosis not present

## 2024-06-14 DIAGNOSIS — G8929 Other chronic pain: Secondary | ICD-10-CM | POA: Diagnosis not present

## 2024-06-14 DIAGNOSIS — Z96649 Presence of unspecified artificial hip joint: Secondary | ICD-10-CM | POA: Diagnosis not present

## 2024-06-14 DIAGNOSIS — M6281 Muscle weakness (generalized): Secondary | ICD-10-CM | POA: Diagnosis not present

## 2024-06-14 DIAGNOSIS — M5442 Lumbago with sciatica, left side: Secondary | ICD-10-CM | POA: Diagnosis not present

## 2024-06-14 DIAGNOSIS — R262 Difficulty in walking, not elsewhere classified: Secondary | ICD-10-CM | POA: Diagnosis not present

## 2024-06-14 DIAGNOSIS — Z7409 Other reduced mobility: Secondary | ICD-10-CM | POA: Diagnosis not present

## 2024-06-15 DIAGNOSIS — K8020 Calculus of gallbladder without cholecystitis without obstruction: Secondary | ICD-10-CM | POA: Diagnosis not present

## 2024-06-15 DIAGNOSIS — N200 Calculus of kidney: Secondary | ICD-10-CM | POA: Diagnosis not present

## 2024-06-15 DIAGNOSIS — K769 Liver disease, unspecified: Secondary | ICD-10-CM | POA: Diagnosis not present

## 2024-06-15 DIAGNOSIS — I77819 Aortic ectasia, unspecified site: Secondary | ICD-10-CM | POA: Diagnosis not present

## 2024-06-15 DIAGNOSIS — N281 Cyst of kidney, acquired: Secondary | ICD-10-CM | POA: Diagnosis not present

## 2024-06-15 DIAGNOSIS — K7689 Other specified diseases of liver: Secondary | ICD-10-CM | POA: Diagnosis not present

## 2024-06-16 DIAGNOSIS — M6281 Muscle weakness (generalized): Secondary | ICD-10-CM | POA: Diagnosis not present

## 2024-06-16 DIAGNOSIS — R262 Difficulty in walking, not elsewhere classified: Secondary | ICD-10-CM | POA: Diagnosis not present

## 2024-06-16 DIAGNOSIS — Z96649 Presence of unspecified artificial hip joint: Secondary | ICD-10-CM | POA: Diagnosis not present

## 2024-06-16 DIAGNOSIS — Z7409 Other reduced mobility: Secondary | ICD-10-CM | POA: Diagnosis not present

## 2024-06-21 DIAGNOSIS — M6281 Muscle weakness (generalized): Secondary | ICD-10-CM | POA: Diagnosis not present

## 2024-06-21 DIAGNOSIS — Z96649 Presence of unspecified artificial hip joint: Secondary | ICD-10-CM | POA: Diagnosis not present

## 2024-06-21 DIAGNOSIS — R262 Difficulty in walking, not elsewhere classified: Secondary | ICD-10-CM | POA: Diagnosis not present

## 2024-06-21 DIAGNOSIS — Z7409 Other reduced mobility: Secondary | ICD-10-CM | POA: Diagnosis not present

## 2024-06-29 DIAGNOSIS — Z7409 Other reduced mobility: Secondary | ICD-10-CM | POA: Diagnosis not present

## 2024-06-29 DIAGNOSIS — Z96649 Presence of unspecified artificial hip joint: Secondary | ICD-10-CM | POA: Diagnosis not present

## 2024-06-29 DIAGNOSIS — R262 Difficulty in walking, not elsewhere classified: Secondary | ICD-10-CM | POA: Diagnosis not present

## 2024-06-29 DIAGNOSIS — M6281 Muscle weakness (generalized): Secondary | ICD-10-CM | POA: Diagnosis not present

## 2024-07-02 DIAGNOSIS — Z96649 Presence of unspecified artificial hip joint: Secondary | ICD-10-CM | POA: Diagnosis not present

## 2024-07-02 DIAGNOSIS — M6281 Muscle weakness (generalized): Secondary | ICD-10-CM | POA: Diagnosis not present

## 2024-07-02 DIAGNOSIS — Z7409 Other reduced mobility: Secondary | ICD-10-CM | POA: Diagnosis not present

## 2024-07-02 DIAGNOSIS — R262 Difficulty in walking, not elsewhere classified: Secondary | ICD-10-CM | POA: Diagnosis not present

## 2024-07-06 DIAGNOSIS — M6281 Muscle weakness (generalized): Secondary | ICD-10-CM | POA: Diagnosis not present

## 2024-07-06 DIAGNOSIS — R635 Abnormal weight gain: Secondary | ICD-10-CM | POA: Diagnosis not present

## 2024-07-06 DIAGNOSIS — M5442 Lumbago with sciatica, left side: Secondary | ICD-10-CM | POA: Diagnosis not present

## 2024-07-06 DIAGNOSIS — R262 Difficulty in walking, not elsewhere classified: Secondary | ICD-10-CM | POA: Diagnosis not present

## 2024-07-06 DIAGNOSIS — G8929 Other chronic pain: Secondary | ICD-10-CM | POA: Diagnosis not present

## 2024-07-08 DIAGNOSIS — G8929 Other chronic pain: Secondary | ICD-10-CM | POA: Diagnosis not present

## 2024-07-08 DIAGNOSIS — M5442 Lumbago with sciatica, left side: Secondary | ICD-10-CM | POA: Diagnosis not present

## 2024-07-08 DIAGNOSIS — M6281 Muscle weakness (generalized): Secondary | ICD-10-CM | POA: Diagnosis not present

## 2024-07-08 DIAGNOSIS — R262 Difficulty in walking, not elsewhere classified: Secondary | ICD-10-CM | POA: Diagnosis not present

## 2024-07-16 DIAGNOSIS — Z5181 Encounter for therapeutic drug level monitoring: Secondary | ICD-10-CM | POA: Diagnosis not present

## 2024-07-16 DIAGNOSIS — M810 Age-related osteoporosis without current pathological fracture: Secondary | ICD-10-CM | POA: Diagnosis not present

## 2024-07-20 DIAGNOSIS — G8929 Other chronic pain: Secondary | ICD-10-CM | POA: Diagnosis not present

## 2024-07-20 DIAGNOSIS — Z5181 Encounter for therapeutic drug level monitoring: Secondary | ICD-10-CM | POA: Diagnosis not present

## 2024-07-20 DIAGNOSIS — R262 Difficulty in walking, not elsewhere classified: Secondary | ICD-10-CM | POA: Diagnosis not present

## 2024-07-20 DIAGNOSIS — M5442 Lumbago with sciatica, left side: Secondary | ICD-10-CM | POA: Diagnosis not present

## 2024-07-20 DIAGNOSIS — M6281 Muscle weakness (generalized): Secondary | ICD-10-CM | POA: Diagnosis not present

## 2024-07-20 DIAGNOSIS — M810 Age-related osteoporosis without current pathological fracture: Secondary | ICD-10-CM | POA: Diagnosis not present

## 2024-07-26 DIAGNOSIS — I739 Peripheral vascular disease, unspecified: Secondary | ICD-10-CM | POA: Diagnosis not present

## 2024-08-03 DIAGNOSIS — Z96642 Presence of left artificial hip joint: Secondary | ICD-10-CM | POA: Diagnosis not present

## 2024-08-03 DIAGNOSIS — Z471 Aftercare following joint replacement surgery: Secondary | ICD-10-CM | POA: Diagnosis not present

## 2024-08-05 DIAGNOSIS — Z7409 Other reduced mobility: Secondary | ICD-10-CM | POA: Diagnosis not present

## 2024-08-05 DIAGNOSIS — Z96649 Presence of unspecified artificial hip joint: Secondary | ICD-10-CM | POA: Diagnosis not present

## 2024-08-05 DIAGNOSIS — M6281 Muscle weakness (generalized): Secondary | ICD-10-CM | POA: Diagnosis not present

## 2024-08-05 DIAGNOSIS — M5442 Lumbago with sciatica, left side: Secondary | ICD-10-CM | POA: Diagnosis not present

## 2024-08-05 DIAGNOSIS — G8929 Other chronic pain: Secondary | ICD-10-CM | POA: Diagnosis not present

## 2024-08-05 DIAGNOSIS — R262 Difficulty in walking, not elsewhere classified: Secondary | ICD-10-CM | POA: Diagnosis not present

## 2024-08-06 DIAGNOSIS — S40021A Contusion of right upper arm, initial encounter: Secondary | ICD-10-CM | POA: Diagnosis not present

## 2024-08-09 DIAGNOSIS — R262 Difficulty in walking, not elsewhere classified: Secondary | ICD-10-CM | POA: Diagnosis not present

## 2024-08-09 DIAGNOSIS — Z96649 Presence of unspecified artificial hip joint: Secondary | ICD-10-CM | POA: Diagnosis not present

## 2024-08-09 DIAGNOSIS — Z7409 Other reduced mobility: Secondary | ICD-10-CM | POA: Diagnosis not present

## 2024-08-09 DIAGNOSIS — M5442 Lumbago with sciatica, left side: Secondary | ICD-10-CM | POA: Diagnosis not present

## 2024-08-09 DIAGNOSIS — G8929 Other chronic pain: Secondary | ICD-10-CM | POA: Diagnosis not present

## 2024-08-09 DIAGNOSIS — M6281 Muscle weakness (generalized): Secondary | ICD-10-CM | POA: Diagnosis not present

## 2024-08-13 DIAGNOSIS — R262 Difficulty in walking, not elsewhere classified: Secondary | ICD-10-CM | POA: Diagnosis not present

## 2024-08-13 DIAGNOSIS — Z96652 Presence of left artificial knee joint: Secondary | ICD-10-CM | POA: Diagnosis not present

## 2024-08-13 DIAGNOSIS — G8929 Other chronic pain: Secondary | ICD-10-CM | POA: Diagnosis not present

## 2024-08-13 DIAGNOSIS — Z7409 Other reduced mobility: Secondary | ICD-10-CM | POA: Diagnosis not present

## 2024-08-13 DIAGNOSIS — M6281 Muscle weakness (generalized): Secondary | ICD-10-CM | POA: Diagnosis not present

## 2024-08-13 DIAGNOSIS — M5442 Lumbago with sciatica, left side: Secondary | ICD-10-CM | POA: Diagnosis not present

## 2024-08-16 ENCOUNTER — Inpatient Hospital Stay: Attending: Hematology & Oncology

## 2024-08-16 ENCOUNTER — Inpatient Hospital Stay (HOSPITAL_BASED_OUTPATIENT_CLINIC_OR_DEPARTMENT_OTHER): Admitting: Family

## 2024-08-16 VITALS — BP 126/61 | HR 89 | Temp 98.1°F | Resp 19 | Ht 60.0 in | Wt 132.1 lb

## 2024-08-16 DIAGNOSIS — I2782 Chronic pulmonary embolism: Secondary | ICD-10-CM | POA: Insufficient documentation

## 2024-08-16 DIAGNOSIS — Z96649 Presence of unspecified artificial hip joint: Secondary | ICD-10-CM | POA: Diagnosis not present

## 2024-08-16 DIAGNOSIS — Z7901 Long term (current) use of anticoagulants: Secondary | ICD-10-CM | POA: Diagnosis not present

## 2024-08-16 DIAGNOSIS — Z86718 Personal history of other venous thrombosis and embolism: Secondary | ICD-10-CM | POA: Insufficient documentation

## 2024-08-16 DIAGNOSIS — G8929 Other chronic pain: Secondary | ICD-10-CM | POA: Diagnosis not present

## 2024-08-16 DIAGNOSIS — Z79899 Other long term (current) drug therapy: Secondary | ICD-10-CM | POA: Diagnosis not present

## 2024-08-16 DIAGNOSIS — M6281 Muscle weakness (generalized): Secondary | ICD-10-CM | POA: Diagnosis not present

## 2024-08-16 DIAGNOSIS — M5442 Lumbago with sciatica, left side: Secondary | ICD-10-CM | POA: Diagnosis not present

## 2024-08-16 DIAGNOSIS — I2699 Other pulmonary embolism without acute cor pulmonale: Secondary | ICD-10-CM | POA: Diagnosis not present

## 2024-08-16 DIAGNOSIS — R0789 Other chest pain: Secondary | ICD-10-CM

## 2024-08-16 DIAGNOSIS — R262 Difficulty in walking, not elsewhere classified: Secondary | ICD-10-CM | POA: Diagnosis not present

## 2024-08-16 DIAGNOSIS — Z8616 Personal history of COVID-19: Secondary | ICD-10-CM | POA: Diagnosis not present

## 2024-08-16 LAB — CMP (CANCER CENTER ONLY)
ALT: 23 U/L (ref 0–44)
AST: 28 U/L (ref 15–41)
Albumin: 4.6 g/dL (ref 3.5–5.0)
Alkaline Phosphatase: 148 U/L — ABNORMAL HIGH (ref 38–126)
Anion gap: 13 (ref 5–15)
BUN: 25 mg/dL — ABNORMAL HIGH (ref 8–23)
CO2: 27 mmol/L (ref 22–32)
Calcium: 10 mg/dL (ref 8.9–10.3)
Chloride: 102 mmol/L (ref 98–111)
Creatinine: 0.86 mg/dL (ref 0.44–1.00)
GFR, Estimated: >60 mL/min (ref >60)
Glucose, Bld: 99 mg/dL (ref 70–99)
Potassium: 4.3 mmol/L (ref 3.5–5.1)
Sodium: 142 mmol/L (ref 135–145)
Total Bilirubin: 0.3 mg/dL (ref 0.0–1.2)
Total Protein: 7.1 g/dL (ref 6.5–8.1)

## 2024-08-16 LAB — CBC WITH DIFFERENTIAL (CANCER CENTER ONLY)
Abs Immature Granulocytes: 0.02 K/uL (ref 0.00–0.07)
Basophils Absolute: 0 K/uL (ref 0.0–0.1)
Basophils Relative: 1 %
Eosinophils Absolute: 0.1 K/uL (ref 0.0–0.5)
Eosinophils Relative: 2 %
HCT: 39.3 % (ref 36.0–46.0)
Hemoglobin: 13 g/dL (ref 12.0–15.0)
Immature Granulocytes: 0 %
Lymphocytes Relative: 30 %
Lymphs Abs: 1.7 K/uL (ref 0.7–4.0)
MCH: 33 pg (ref 26.0–34.0)
MCHC: 33.1 g/dL (ref 30.0–36.0)
MCV: 99.7 fL (ref 80.0–100.0)
Monocytes Absolute: 0.7 K/uL (ref 0.1–1.0)
Monocytes Relative: 12 %
Neutro Abs: 3.2 K/uL (ref 1.7–7.7)
Neutrophils Relative %: 55 %
Platelet Count: 175 K/uL (ref 150–400)
RBC: 3.94 MIL/uL (ref 3.87–5.11)
RDW: 12.4 % (ref 11.5–15.5)
WBC Count: 5.7 K/uL (ref 4.0–10.5)
nRBC: 0 % (ref 0.0–0.2)

## 2024-08-16 LAB — D-DIMER, QUANTITATIVE: D-Dimer, Quant: 0.3 ug{FEU}/mL (ref 0.00–0.50)

## 2024-08-16 NOTE — Progress Notes (Signed)
 Hematology and Oncology Follow Up Visit  Hayley Walters [id number] 05/22/1946 78 y.o. 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 Hayley Walters is doing quite well and has no complaints at this time.  Hayley Walters has recuperated nicely having left hip replacement in January. Hayley Walters is still in PT for strengthening and doing great.  No swelling, tenderness, numbness or tingling in her extremities.  No falls or syncope.  Hayley Walters has not noted any blood loss. No bruising or petechiae.  Hayley Walters is taking her Eliquis  5 mg PO BID as prescribed.  Appetite and hydration are good. Weight is stable at 132 lbs.  No fever, chills, n/v, cough, rash, dizziness, SOB, chest pain, palpitations, abdominal pain or changes in bowel or bladder habits.   ECOG Performance Status: 1 - Symptomatic but completely ambulatory  Medications:  Allergies as of 08/16/2024       Reactions   Tramadol  Nausea And Vomiting        Medication List        Accurate as of August 16, 2024  1:26 PM. If you have any questions, ask your nurse or doctor.          ALPRAZolam 0.5 MG tablet Commonly known as: XANAX Take 0.5 mg by mouth 3 (three) times daily as needed for anxiety.   apixaban  2.5 MG Tabs tablet Commonly known as: Eliquis  Take 1 tablet (2.5 mg total) by mouth 2 (two) times daily.   calcium-vitamin D 500-5 MG-MCG tablet Commonly known as: OSCAL WITH D Take 1 tablet by mouth.   cholecalciferol 25 MCG (1000 UNIT) tablet Commonly known as: VITAMIN D3 Take 1,000 Units by mouth daily.   HYDROcodone -acetaminophen  5-325 MG tablet Commonly known as: NORCO/VICODIN Take 1 tablet by mouth every 6 (six) hours as needed.    Retin-A 0.05 % cream Generic drug: tretinoin Apply 1 application  topically at bedtime.   rizatriptan 10 MG disintegrating tablet Commonly known as: MAXALT-MLT Take 10 mg by mouth See admin instructions. Take 1 tablet (10 mg) by mouth as needed for migraines, may repeat in 2 hours if needed   sertraline 100 MG tablet Commonly known as: ZOLOFT Take 50 mg by mouth daily.   urea  10 % cream Commonly known as: CARMOL Apply topically as needed.   valACYclovir 1000 MG tablet Commonly known as: VALTREX Take 1,000 mg by mouth 3 (three) times daily.   VITAMIN B COMPLEX PO Take 1 tablet by mouth daily.        Allergies:  Allergies  Allergen Reactions   Tramadol  Nausea And Vomiting    Past Medical History, Surgical history, Social history, and Family History were reviewed and updated.  Review of Systems: All other 10 point review of systems is negative.   Physical Exam:  vitals were not taken for this visit.   Wt Readings from Last 3 Encounters:  02/12/24 160 lb (72.6 kg)  10/15/23 162 lb (73.5 kg)  06/17/23 159 lb 12.8 oz (72.5 kg)    Ocular: Sclerae unicteric, pupils equal, round and reactive to light Ear-nose-throat: Oropharynx clear, dentition fair Lymphatic: No cervical or supraclavicular adenopathy Lungs no rales or rhonchi, good excursion bilaterally Heart regular  rate and rhythm, no murmur appreciated Abd soft, nontender, positive bowel sounds MSK no focal spinal tenderness, no joint edema Neuro: non-focal, well-oriented, appropriate affect Breasts: Deferred   Lab Results  Component Value Date   WBC 5.1 02/12/2024   HGB 12.0 02/12/2024   HCT 38.0 02/12/2024   MCV 100.5 (H) 02/12/2024   PLT 199 02/12/2024   No results found for: FERRITIN, IRON, TIBC, UIBC, IRONPCTSAT Lab Results  Component Value Date   RBC 3.78 (L) 02/12/2024   No results found for: KPAFRELGTCHN, LAMBDASER, KAPLAMBRATIO No results found for: KIMBERLY BRYCE PANNING Lab Results  Component Value Date   ALBUMINELP 4.1 09/27/2021   MSPIKE Not Observed 09/27/2021     Chemistry      Component Value Date/Time   NA 141 02/12/2024 1152   NA 144 09/27/2021 1022   K 4.9 02/12/2024 1152   CL 102 02/12/2024 1152   CO2 29 02/12/2024 1152   BUN 19 02/12/2024 1152   BUN 28 (H) 09/27/2021 1022   CREATININE 0.90 02/12/2024 1152      Component Value Date/Time   CALCIUM 10.1 02/12/2024 1152   ALKPHOS 160 (H) 02/12/2024 1152   AST 19 02/12/2024 1152   ALT 14 02/12/2024 1152   BILITOT 0.4 02/12/2024 1152       Impression and Plan: Hayley Walters is a very pleasant 78 yo caucasian female with history of right lower extremity DVT and bilateral PE's in 2022 with Covid. 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  Hayley Walters is now on maintenance Eliquis  at 2.5 mg  Follow-up in 6 months.   Lauraine Pepper, NP 9/15/20251:26 PM

## 2024-08-19 DIAGNOSIS — G8929 Other chronic pain: Secondary | ICD-10-CM | POA: Diagnosis not present

## 2024-08-19 DIAGNOSIS — Z96649 Presence of unspecified artificial hip joint: Secondary | ICD-10-CM | POA: Diagnosis not present

## 2024-08-19 DIAGNOSIS — M5442 Lumbago with sciatica, left side: Secondary | ICD-10-CM | POA: Diagnosis not present

## 2024-08-19 DIAGNOSIS — M6281 Muscle weakness (generalized): Secondary | ICD-10-CM | POA: Diagnosis not present

## 2024-08-19 DIAGNOSIS — R262 Difficulty in walking, not elsewhere classified: Secondary | ICD-10-CM | POA: Diagnosis not present

## 2024-08-19 DIAGNOSIS — Z7409 Other reduced mobility: Secondary | ICD-10-CM | POA: Diagnosis not present

## 2024-08-23 DIAGNOSIS — M5442 Lumbago with sciatica, left side: Secondary | ICD-10-CM | POA: Diagnosis not present

## 2024-08-23 DIAGNOSIS — R262 Difficulty in walking, not elsewhere classified: Secondary | ICD-10-CM | POA: Diagnosis not present

## 2024-08-23 DIAGNOSIS — M6281 Muscle weakness (generalized): Secondary | ICD-10-CM | POA: Diagnosis not present

## 2024-08-23 DIAGNOSIS — G8929 Other chronic pain: Secondary | ICD-10-CM | POA: Diagnosis not present

## 2024-08-24 DIAGNOSIS — M810 Age-related osteoporosis without current pathological fracture: Secondary | ICD-10-CM | POA: Diagnosis not present

## 2024-08-27 DIAGNOSIS — Z96649 Presence of unspecified artificial hip joint: Secondary | ICD-10-CM | POA: Diagnosis not present

## 2024-08-27 DIAGNOSIS — M5442 Lumbago with sciatica, left side: Secondary | ICD-10-CM | POA: Diagnosis not present

## 2024-08-27 DIAGNOSIS — M6281 Muscle weakness (generalized): Secondary | ICD-10-CM | POA: Diagnosis not present

## 2024-08-27 DIAGNOSIS — R262 Difficulty in walking, not elsewhere classified: Secondary | ICD-10-CM | POA: Diagnosis not present

## 2024-08-27 DIAGNOSIS — G8929 Other chronic pain: Secondary | ICD-10-CM | POA: Diagnosis not present

## 2024-08-27 DIAGNOSIS — Z7409 Other reduced mobility: Secondary | ICD-10-CM | POA: Diagnosis not present

## 2024-09-08 DIAGNOSIS — M5442 Lumbago with sciatica, left side: Secondary | ICD-10-CM | POA: Diagnosis not present

## 2024-09-08 DIAGNOSIS — M6281 Muscle weakness (generalized): Secondary | ICD-10-CM | POA: Diagnosis not present

## 2024-09-08 DIAGNOSIS — G8929 Other chronic pain: Secondary | ICD-10-CM | POA: Diagnosis not present

## 2024-09-08 DIAGNOSIS — R262 Difficulty in walking, not elsewhere classified: Secondary | ICD-10-CM | POA: Diagnosis not present

## 2024-09-15 DIAGNOSIS — G8929 Other chronic pain: Secondary | ICD-10-CM | POA: Diagnosis not present

## 2024-09-15 DIAGNOSIS — M5442 Lumbago with sciatica, left side: Secondary | ICD-10-CM | POA: Diagnosis not present

## 2024-09-15 DIAGNOSIS — R262 Difficulty in walking, not elsewhere classified: Secondary | ICD-10-CM | POA: Diagnosis not present

## 2024-09-15 DIAGNOSIS — M6281 Muscle weakness (generalized): Secondary | ICD-10-CM | POA: Diagnosis not present

## 2024-09-21 DIAGNOSIS — G8929 Other chronic pain: Secondary | ICD-10-CM | POA: Diagnosis not present

## 2024-09-21 DIAGNOSIS — M5442 Lumbago with sciatica, left side: Secondary | ICD-10-CM | POA: Diagnosis not present

## 2024-09-21 DIAGNOSIS — R262 Difficulty in walking, not elsewhere classified: Secondary | ICD-10-CM | POA: Diagnosis not present

## 2024-09-21 DIAGNOSIS — M6281 Muscle weakness (generalized): Secondary | ICD-10-CM | POA: Diagnosis not present

## 2024-09-23 DIAGNOSIS — M810 Age-related osteoporosis without current pathological fracture: Secondary | ICD-10-CM | POA: Diagnosis not present

## 2024-09-23 DIAGNOSIS — Z780 Asymptomatic menopausal state: Secondary | ICD-10-CM | POA: Diagnosis not present

## 2024-09-29 DIAGNOSIS — G8929 Other chronic pain: Secondary | ICD-10-CM | POA: Diagnosis not present

## 2024-09-29 DIAGNOSIS — M5442 Lumbago with sciatica, left side: Secondary | ICD-10-CM | POA: Diagnosis not present

## 2024-09-29 DIAGNOSIS — R262 Difficulty in walking, not elsewhere classified: Secondary | ICD-10-CM | POA: Diagnosis not present

## 2024-09-29 DIAGNOSIS — M6281 Muscle weakness (generalized): Secondary | ICD-10-CM | POA: Diagnosis not present

## 2024-10-11 DIAGNOSIS — E559 Vitamin D deficiency, unspecified: Secondary | ICD-10-CM | POA: Diagnosis not present

## 2024-10-11 DIAGNOSIS — Z23 Encounter for immunization: Secondary | ICD-10-CM | POA: Diagnosis not present

## 2024-10-11 DIAGNOSIS — F419 Anxiety disorder, unspecified: Secondary | ICD-10-CM | POA: Diagnosis not present

## 2024-10-11 DIAGNOSIS — Z0000 Encounter for general adult medical examination without abnormal findings: Secondary | ICD-10-CM | POA: Diagnosis not present

## 2024-10-11 DIAGNOSIS — M810 Age-related osteoporosis without current pathological fracture: Secondary | ICD-10-CM | POA: Diagnosis not present

## 2024-10-11 DIAGNOSIS — E785 Hyperlipidemia, unspecified: Secondary | ICD-10-CM | POA: Diagnosis not present

## 2024-10-12 ENCOUNTER — Other Ambulatory Visit: Payer: Self-pay | Admitting: Internal Medicine

## 2024-10-12 DIAGNOSIS — Z1231 Encounter for screening mammogram for malignant neoplasm of breast: Secondary | ICD-10-CM

## 2024-10-13 DIAGNOSIS — G8929 Other chronic pain: Secondary | ICD-10-CM | POA: Diagnosis not present

## 2024-10-13 DIAGNOSIS — M5442 Lumbago with sciatica, left side: Secondary | ICD-10-CM | POA: Diagnosis not present

## 2024-10-13 DIAGNOSIS — M6281 Muscle weakness (generalized): Secondary | ICD-10-CM | POA: Diagnosis not present

## 2024-10-13 DIAGNOSIS — R262 Difficulty in walking, not elsewhere classified: Secondary | ICD-10-CM | POA: Diagnosis not present

## 2024-10-15 DIAGNOSIS — M7751 Other enthesopathy of right foot: Secondary | ICD-10-CM | POA: Diagnosis not present

## 2024-10-15 DIAGNOSIS — L600 Ingrowing nail: Secondary | ICD-10-CM | POA: Diagnosis not present

## 2024-10-15 DIAGNOSIS — M2042 Other hammer toe(s) (acquired), left foot: Secondary | ICD-10-CM | POA: Diagnosis not present

## 2024-10-15 DIAGNOSIS — M898X7 Other specified disorders of bone, ankle and foot: Secondary | ICD-10-CM | POA: Diagnosis not present

## 2024-10-15 DIAGNOSIS — M25871 Other specified joint disorders, right ankle and foot: Secondary | ICD-10-CM | POA: Diagnosis not present

## 2024-10-15 DIAGNOSIS — M25872 Other specified joint disorders, left ankle and foot: Secondary | ICD-10-CM | POA: Diagnosis not present

## 2024-10-19 DIAGNOSIS — Z8781 Personal history of (healed) traumatic fracture: Secondary | ICD-10-CM | POA: Diagnosis not present

## 2024-10-19 DIAGNOSIS — M810 Age-related osteoporosis without current pathological fracture: Secondary | ICD-10-CM | POA: Diagnosis not present

## 2024-10-19 DIAGNOSIS — Z5181 Encounter for therapeutic drug level monitoring: Secondary | ICD-10-CM | POA: Diagnosis not present

## 2024-10-19 DIAGNOSIS — Z86711 Personal history of pulmonary embolism: Secondary | ICD-10-CM | POA: Diagnosis not present

## 2024-10-19 DIAGNOSIS — Z79899 Other long term (current) drug therapy: Secondary | ICD-10-CM | POA: Diagnosis not present

## 2024-10-19 DIAGNOSIS — Z9181 History of falling: Secondary | ICD-10-CM | POA: Diagnosis not present

## 2024-10-19 DIAGNOSIS — M47816 Spondylosis without myelopathy or radiculopathy, lumbar region: Secondary | ICD-10-CM | POA: Diagnosis not present

## 2024-10-20 DIAGNOSIS — M6281 Muscle weakness (generalized): Secondary | ICD-10-CM | POA: Diagnosis not present

## 2024-10-20 DIAGNOSIS — M5442 Lumbago with sciatica, left side: Secondary | ICD-10-CM | POA: Diagnosis not present

## 2024-10-20 DIAGNOSIS — R262 Difficulty in walking, not elsewhere classified: Secondary | ICD-10-CM | POA: Diagnosis not present

## 2024-10-21 DIAGNOSIS — N281 Cyst of kidney, acquired: Secondary | ICD-10-CM | POA: Diagnosis not present

## 2024-11-03 DIAGNOSIS — D485 Neoplasm of uncertain behavior of skin: Secondary | ICD-10-CM | POA: Diagnosis not present

## 2024-11-03 DIAGNOSIS — L821 Other seborrheic keratosis: Secondary | ICD-10-CM | POA: Diagnosis not present

## 2024-11-03 DIAGNOSIS — D225 Melanocytic nevi of trunk: Secondary | ICD-10-CM | POA: Diagnosis not present

## 2024-11-03 DIAGNOSIS — L570 Actinic keratosis: Secondary | ICD-10-CM | POA: Diagnosis not present

## 2024-11-03 DIAGNOSIS — D0462 Carcinoma in situ of skin of left upper limb, including shoulder: Secondary | ICD-10-CM | POA: Diagnosis not present

## 2024-11-03 DIAGNOSIS — L820 Inflamed seborrheic keratosis: Secondary | ICD-10-CM | POA: Diagnosis not present

## 2025-02-14 ENCOUNTER — Other Ambulatory Visit

## 2025-02-14 ENCOUNTER — Ambulatory Visit: Admitting: Family
